# Patient Record
Sex: Female | Born: 1935 | Race: Black or African American | Hispanic: No | Smoking: Never smoker
Health system: Southern US, Community
[De-identification: ages and names within clinical notes are randomized; demographics above are authoritative.]

## PROBLEM LIST (undated history)

## (undated) DIAGNOSIS — F0390 Unspecified dementia without behavioral disturbance: Secondary | ICD-10-CM

## (undated) DIAGNOSIS — C679 Malignant neoplasm of bladder, unspecified: Secondary | ICD-10-CM

## (undated) DIAGNOSIS — K219 Gastro-esophageal reflux disease without esophagitis: Secondary | ICD-10-CM

## (undated) DIAGNOSIS — A048 Other specified bacterial intestinal infections: Secondary | ICD-10-CM

## (undated) DIAGNOSIS — F419 Anxiety disorder, unspecified: Secondary | ICD-10-CM

## (undated) DIAGNOSIS — R002 Palpitations: Secondary | ICD-10-CM

## (undated) DIAGNOSIS — I10 Essential (primary) hypertension: Secondary | ICD-10-CM

## (undated) HISTORY — DX: Gastro-esophageal reflux disease without esophagitis: K21.9

## (undated) HISTORY — DX: Palpitations: R00.2

## (undated) HISTORY — PX: CATARACT EXTRACTION: SUR2

## (undated) HISTORY — DX: Malignant neoplasm of bladder, unspecified: C67.9

## (undated) HISTORY — DX: Other specified bacterial intestinal infections: A04.8

## (undated) HISTORY — DX: Essential (primary) hypertension: I10

## (undated) HISTORY — DX: Anxiety disorder, unspecified: F41.9

---

## 1996-05-07 HISTORY — PX: CHOLECYSTECTOMY: SHX55

## 1997-10-05 ENCOUNTER — Other Ambulatory Visit: Admission: RE | Admit: 1997-10-05 | Discharge: 1997-10-05 | Payer: Self-pay | Admitting: Obstetrics and Gynecology

## 1998-02-03 ENCOUNTER — Ambulatory Visit (HOSPITAL_COMMUNITY): Admission: RE | Admit: 1998-02-03 | Discharge: 1998-02-03 | Payer: Self-pay | Admitting: Obstetrics and Gynecology

## 1999-01-02 ENCOUNTER — Other Ambulatory Visit: Admission: RE | Admit: 1999-01-02 | Discharge: 1999-01-02 | Payer: Self-pay | Admitting: Obstetrics and Gynecology

## 1999-01-18 ENCOUNTER — Ambulatory Visit (HOSPITAL_COMMUNITY): Admission: RE | Admit: 1999-01-18 | Discharge: 1999-01-18 | Payer: Self-pay | Admitting: Obstetrics and Gynecology

## 1999-01-18 ENCOUNTER — Encounter: Payer: Self-pay | Admitting: Obstetrics and Gynecology

## 2000-01-31 ENCOUNTER — Other Ambulatory Visit: Admission: RE | Admit: 2000-01-31 | Discharge: 2000-01-31 | Payer: Self-pay | Admitting: Obstetrics and Gynecology

## 2000-02-08 ENCOUNTER — Ambulatory Visit (HOSPITAL_COMMUNITY): Admission: RE | Admit: 2000-02-08 | Discharge: 2000-02-08 | Payer: Self-pay | Admitting: Obstetrics and Gynecology

## 2000-02-08 ENCOUNTER — Encounter: Payer: Self-pay | Admitting: Obstetrics and Gynecology

## 2001-03-12 ENCOUNTER — Encounter: Payer: Self-pay | Admitting: Obstetrics and Gynecology

## 2001-03-12 ENCOUNTER — Ambulatory Visit (HOSPITAL_COMMUNITY): Admission: RE | Admit: 2001-03-12 | Discharge: 2001-03-12 | Payer: Self-pay | Admitting: Obstetrics and Gynecology

## 2001-03-12 ENCOUNTER — Other Ambulatory Visit: Admission: RE | Admit: 2001-03-12 | Discharge: 2001-03-12 | Payer: Self-pay | Admitting: Obstetrics and Gynecology

## 2001-07-10 ENCOUNTER — Inpatient Hospital Stay (HOSPITAL_COMMUNITY): Admission: AD | Admit: 2001-07-10 | Discharge: 2001-07-15 | Payer: Self-pay | Admitting: Cardiology

## 2002-03-16 ENCOUNTER — Encounter: Payer: Self-pay | Admitting: Obstetrics and Gynecology

## 2002-03-16 ENCOUNTER — Other Ambulatory Visit: Admission: RE | Admit: 2002-03-16 | Discharge: 2002-03-16 | Payer: Self-pay | Admitting: Obstetrics and Gynecology

## 2002-03-16 ENCOUNTER — Ambulatory Visit (HOSPITAL_COMMUNITY): Admission: RE | Admit: 2002-03-16 | Discharge: 2002-03-16 | Payer: Self-pay | Admitting: Obstetrics and Gynecology

## 2002-10-07 ENCOUNTER — Ambulatory Visit (HOSPITAL_COMMUNITY): Admission: RE | Admit: 2002-10-07 | Discharge: 2002-10-07 | Payer: Self-pay | Admitting: Family Medicine

## 2002-12-25 ENCOUNTER — Emergency Department (HOSPITAL_COMMUNITY): Admission: EM | Admit: 2002-12-25 | Discharge: 2002-12-25 | Payer: Self-pay | Admitting: Emergency Medicine

## 2003-01-14 ENCOUNTER — Encounter (HOSPITAL_COMMUNITY): Admission: RE | Admit: 2003-01-14 | Discharge: 2003-02-04 | Payer: Self-pay | Admitting: Family Medicine

## 2003-04-13 ENCOUNTER — Ambulatory Visit (HOSPITAL_COMMUNITY): Admission: RE | Admit: 2003-04-13 | Discharge: 2003-04-13 | Payer: Self-pay | Admitting: Obstetrics and Gynecology

## 2003-04-13 ENCOUNTER — Other Ambulatory Visit: Admission: RE | Admit: 2003-04-13 | Discharge: 2003-04-13 | Payer: Self-pay | Admitting: Obstetrics and Gynecology

## 2003-06-17 ENCOUNTER — Observation Stay (HOSPITAL_COMMUNITY): Admission: EM | Admit: 2003-06-17 | Discharge: 2003-06-18 | Payer: Self-pay | Admitting: Emergency Medicine

## 2004-03-16 ENCOUNTER — Ambulatory Visit: Payer: Self-pay | Admitting: Cardiology

## 2004-05-10 ENCOUNTER — Ambulatory Visit (HOSPITAL_COMMUNITY): Admission: RE | Admit: 2004-05-10 | Discharge: 2004-05-10 | Payer: Self-pay | Admitting: Obstetrics and Gynecology

## 2004-05-10 ENCOUNTER — Other Ambulatory Visit: Admission: RE | Admit: 2004-05-10 | Discharge: 2004-05-10 | Payer: Self-pay | Admitting: Obstetrics and Gynecology

## 2005-03-06 ENCOUNTER — Ambulatory Visit: Payer: Self-pay | Admitting: Cardiology

## 2005-05-14 ENCOUNTER — Other Ambulatory Visit: Admission: RE | Admit: 2005-05-14 | Discharge: 2005-05-14 | Payer: Self-pay | Admitting: Obstetrics and Gynecology

## 2005-05-14 ENCOUNTER — Ambulatory Visit (HOSPITAL_COMMUNITY): Admission: RE | Admit: 2005-05-14 | Discharge: 2005-05-14 | Payer: Self-pay | Admitting: Obstetrics and Gynecology

## 2006-02-19 ENCOUNTER — Ambulatory Visit: Payer: Self-pay | Admitting: Cardiology

## 2006-02-28 ENCOUNTER — Encounter: Payer: Self-pay | Admitting: Cardiology

## 2006-05-20 ENCOUNTER — Ambulatory Visit (HOSPITAL_COMMUNITY): Admission: RE | Admit: 2006-05-20 | Discharge: 2006-05-20 | Payer: Self-pay | Admitting: Obstetrics and Gynecology

## 2007-03-13 ENCOUNTER — Ambulatory Visit: Payer: Self-pay | Admitting: Cardiology

## 2007-05-08 DIAGNOSIS — C679 Malignant neoplasm of bladder, unspecified: Secondary | ICD-10-CM

## 2007-05-08 HISTORY — PX: OTHER SURGICAL HISTORY: SHX169

## 2007-05-08 HISTORY — DX: Malignant neoplasm of bladder, unspecified: C67.9

## 2007-05-28 ENCOUNTER — Ambulatory Visit (HOSPITAL_COMMUNITY): Admission: RE | Admit: 2007-05-28 | Discharge: 2007-05-28 | Payer: Self-pay | Admitting: Obstetrics and Gynecology

## 2007-06-19 ENCOUNTER — Encounter: Payer: Self-pay | Admitting: Family Medicine

## 2007-07-20 ENCOUNTER — Emergency Department (HOSPITAL_COMMUNITY): Admission: EM | Admit: 2007-07-20 | Discharge: 2007-07-20 | Payer: Self-pay | Admitting: Emergency Medicine

## 2007-07-21 ENCOUNTER — Ambulatory Visit: Payer: Self-pay | Admitting: Family Medicine

## 2007-07-23 ENCOUNTER — Encounter: Payer: Self-pay | Admitting: Family Medicine

## 2007-07-23 LAB — CONVERTED CEMR LAB
Calcium: 10.2 mg/dL (ref 8.4–10.5)
Cholesterol: 156 mg/dL (ref 0–200)
Creatinine, Ser: 1.04 mg/dL (ref 0.40–1.20)
HDL: 66 mg/dL (ref >39)
Sodium: 139 meq/L (ref 135–145)
Total CHOL/HDL Ratio: 2.4
Triglycerides: 69 mg/dL (ref <150)

## 2007-07-25 ENCOUNTER — Encounter: Payer: Self-pay | Admitting: Family Medicine

## 2007-07-25 DIAGNOSIS — F411 Generalized anxiety disorder: Secondary | ICD-10-CM | POA: Insufficient documentation

## 2007-07-25 DIAGNOSIS — E785 Hyperlipidemia, unspecified: Secondary | ICD-10-CM

## 2007-07-25 DIAGNOSIS — R011 Cardiac murmur, unspecified: Secondary | ICD-10-CM | POA: Insufficient documentation

## 2007-07-25 DIAGNOSIS — K219 Gastro-esophageal reflux disease without esophagitis: Secondary | ICD-10-CM | POA: Insufficient documentation

## 2007-07-25 DIAGNOSIS — I10 Essential (primary) hypertension: Secondary | ICD-10-CM

## 2007-09-09 ENCOUNTER — Ambulatory Visit: Payer: Self-pay | Admitting: Family Medicine

## 2007-10-16 ENCOUNTER — Ambulatory Visit: Payer: Self-pay | Admitting: Family Medicine

## 2007-11-20 ENCOUNTER — Ambulatory Visit: Payer: Self-pay | Admitting: Family Medicine

## 2007-11-20 LAB — CONVERTED CEMR LAB
Ketones, ur: NEGATIVE mg/dL
Specific Gravity, Urine: 1.019 (ref 1.005–1.03)
Urine Glucose: NEGATIVE mg/dL
WBC, UA: NONE SEEN cells/hpf (ref <3)
pH: 6 (ref 5.0–8.0)

## 2007-11-25 ENCOUNTER — Ambulatory Visit (HOSPITAL_COMMUNITY): Admission: RE | Admit: 2007-11-25 | Discharge: 2007-11-25 | Payer: Self-pay | Admitting: Family Medicine

## 2007-12-01 ENCOUNTER — Ambulatory Visit (HOSPITAL_COMMUNITY): Admission: RE | Admit: 2007-12-01 | Discharge: 2007-12-01 | Payer: Self-pay | Admitting: Urology

## 2007-12-10 ENCOUNTER — Encounter: Payer: Self-pay | Admitting: Family Medicine

## 2007-12-10 ENCOUNTER — Ambulatory Visit: Payer: Self-pay | Admitting: Family Medicine

## 2007-12-22 ENCOUNTER — Ambulatory Visit (HOSPITAL_COMMUNITY): Admission: RE | Admit: 2007-12-22 | Discharge: 2007-12-23 | Payer: Self-pay | Admitting: Urology

## 2007-12-22 ENCOUNTER — Encounter (INDEPENDENT_AMBULATORY_CARE_PROVIDER_SITE_OTHER): Payer: Self-pay | Admitting: Urology

## 2007-12-22 ENCOUNTER — Encounter: Payer: Self-pay | Admitting: Family Medicine

## 2007-12-23 ENCOUNTER — Encounter: Payer: Self-pay | Admitting: Family Medicine

## 2007-12-25 ENCOUNTER — Encounter: Payer: Self-pay | Admitting: Family Medicine

## 2008-01-01 ENCOUNTER — Telehealth: Payer: Self-pay | Admitting: Family Medicine

## 2008-01-29 ENCOUNTER — Telehealth: Payer: Self-pay | Admitting: Family Medicine

## 2008-02-03 ENCOUNTER — Ambulatory Visit: Payer: Self-pay | Admitting: Family Medicine

## 2008-02-04 ENCOUNTER — Telehealth: Payer: Self-pay | Admitting: Family Medicine

## 2008-02-08 DIAGNOSIS — Z8551 Personal history of malignant neoplasm of bladder: Secondary | ICD-10-CM

## 2008-02-19 ENCOUNTER — Encounter: Payer: Self-pay | Admitting: Family Medicine

## 2008-03-05 ENCOUNTER — Encounter: Payer: Self-pay | Admitting: Family Medicine

## 2008-04-07 ENCOUNTER — Ambulatory Visit: Payer: Self-pay | Admitting: Family Medicine

## 2008-04-07 DIAGNOSIS — R5383 Other fatigue: Secondary | ICD-10-CM

## 2008-04-07 DIAGNOSIS — R5381 Other malaise: Secondary | ICD-10-CM

## 2008-04-08 ENCOUNTER — Encounter: Payer: Self-pay | Admitting: Family Medicine

## 2008-04-08 LAB — CONVERTED CEMR LAB
ALT: 14 units/L (ref 0–35)
Albumin: 4.6 g/dL (ref 3.5–5.2)
BUN: 15 mg/dL (ref 6–23)
Chloride: 100 meq/L (ref 96–112)
Cholesterol: 160 mg/dL (ref 0–200)
Hemoglobin: 13.2 g/dL (ref 12.0–15.0)
Indirect Bilirubin: 0.5 mg/dL (ref 0.0–0.9)
LDL Cholesterol: 81 mg/dL (ref 0–99)
MCHC: 33.4 g/dL (ref 30.0–36.0)
Potassium: 4.1 meq/L (ref 3.5–5.3)
RBC: 4.22 M/uL (ref 3.87–5.11)
RDW: 14.4 % (ref 11.5–15.5)
Sodium: 141 meq/L (ref 135–145)
TSH: 1.202 microintl units/mL (ref 0.350–4.50)
Total CHOL/HDL Ratio: 2.4
Total Protein: 7.1 g/dL (ref 6.0–8.3)
Triglycerides: 62 mg/dL (ref <150)
VLDL: 12 mg/dL (ref 0–40)

## 2008-04-09 ENCOUNTER — Encounter: Payer: Self-pay | Admitting: Family Medicine

## 2008-04-09 ENCOUNTER — Ambulatory Visit: Payer: Self-pay | Admitting: Cardiology

## 2008-05-03 ENCOUNTER — Encounter: Payer: Self-pay | Admitting: Family Medicine

## 2008-05-06 ENCOUNTER — Encounter: Payer: Self-pay | Admitting: Family Medicine

## 2008-05-10 ENCOUNTER — Encounter: Payer: Self-pay | Admitting: Cardiology

## 2008-05-10 ENCOUNTER — Ambulatory Visit (HOSPITAL_COMMUNITY): Admission: RE | Admit: 2008-05-10 | Discharge: 2008-05-10 | Payer: Self-pay | Admitting: Cardiology

## 2008-05-10 ENCOUNTER — Ambulatory Visit: Payer: Self-pay | Admitting: Cardiology

## 2008-06-24 ENCOUNTER — Emergency Department (HOSPITAL_COMMUNITY): Admission: EM | Admit: 2008-06-24 | Discharge: 2008-06-24 | Payer: Self-pay | Admitting: Emergency Medicine

## 2008-07-14 ENCOUNTER — Ambulatory Visit: Payer: Self-pay | Admitting: Family Medicine

## 2008-07-15 ENCOUNTER — Ambulatory Visit: Payer: Self-pay | Admitting: Cardiology

## 2008-07-19 ENCOUNTER — Encounter: Payer: Self-pay | Admitting: Family Medicine

## 2008-07-19 ENCOUNTER — Ambulatory Visit (HOSPITAL_COMMUNITY): Admission: RE | Admit: 2008-07-19 | Discharge: 2008-07-19 | Payer: Self-pay | Admitting: Urology

## 2008-07-19 ENCOUNTER — Encounter (INDEPENDENT_AMBULATORY_CARE_PROVIDER_SITE_OTHER): Payer: Self-pay | Admitting: Urology

## 2008-07-20 ENCOUNTER — Encounter: Payer: Self-pay | Admitting: Family Medicine

## 2008-08-03 ENCOUNTER — Encounter: Payer: Self-pay | Admitting: Family Medicine

## 2008-08-10 ENCOUNTER — Telehealth: Payer: Self-pay | Admitting: Family Medicine

## 2008-08-18 ENCOUNTER — Inpatient Hospital Stay (HOSPITAL_COMMUNITY): Admission: EM | Admit: 2008-08-18 | Discharge: 2008-08-19 | Payer: Self-pay | Admitting: Emergency Medicine

## 2008-08-23 ENCOUNTER — Encounter: Payer: Self-pay | Admitting: Family Medicine

## 2008-08-25 ENCOUNTER — Ambulatory Visit: Payer: Self-pay | Admitting: Cardiology

## 2008-08-26 ENCOUNTER — Ambulatory Visit: Payer: Self-pay | Admitting: Family Medicine

## 2008-08-26 LAB — CONVERTED CEMR LAB
Bilirubin Urine: NEGATIVE
Glucose, Urine, Semiquant: NEGATIVE
Specific Gravity, Urine: 1.02
pH: 6

## 2008-08-27 ENCOUNTER — Encounter: Payer: Self-pay | Admitting: Family Medicine

## 2008-08-27 LAB — CONVERTED CEMR LAB
CO2: 28 meq/L (ref 19–32)
Calcium: 9.8 mg/dL (ref 8.4–10.5)
Creatinine, Ser: 1.18 mg/dL (ref 0.40–1.20)
Sodium: 138 meq/L (ref 135–145)

## 2008-09-21 ENCOUNTER — Encounter: Payer: Self-pay | Admitting: Family Medicine

## 2008-10-13 ENCOUNTER — Telehealth: Payer: Self-pay | Admitting: Cardiology

## 2008-11-02 ENCOUNTER — Other Ambulatory Visit: Admission: RE | Admit: 2008-11-02 | Discharge: 2008-11-02 | Payer: Self-pay | Admitting: Family Medicine

## 2008-11-02 ENCOUNTER — Ambulatory Visit: Payer: Self-pay | Admitting: Family Medicine

## 2008-11-02 ENCOUNTER — Encounter: Payer: Self-pay | Admitting: Family Medicine

## 2008-11-02 LAB — CONVERTED CEMR LAB: OCCULT 1: NEGATIVE

## 2008-11-08 DIAGNOSIS — J309 Allergic rhinitis, unspecified: Secondary | ICD-10-CM | POA: Insufficient documentation

## 2008-11-12 ENCOUNTER — Emergency Department (HOSPITAL_COMMUNITY): Admission: EM | Admit: 2008-11-12 | Discharge: 2008-11-12 | Payer: Self-pay | Admitting: Emergency Medicine

## 2008-11-17 ENCOUNTER — Telehealth: Payer: Self-pay | Admitting: Family Medicine

## 2008-11-18 ENCOUNTER — Encounter: Payer: Self-pay | Admitting: Family Medicine

## 2008-11-19 ENCOUNTER — Encounter: Payer: Self-pay | Admitting: Family Medicine

## 2008-11-19 LAB — CONVERTED CEMR LAB
ALT: 65 units/L — ABNORMAL HIGH (ref 0–35)
Albumin: 4.6 g/dL (ref 3.5–5.2)
BUN: 16 mg/dL (ref 6–23)
Chloride: 96 meq/L (ref 96–112)
Cholesterol: 157 mg/dL (ref 0–200)
HDL: 70 mg/dL (ref >39)
Indirect Bilirubin: 0.7 mg/dL (ref 0.0–0.9)
Potassium: 4.6 meq/L (ref 3.5–5.3)
Sodium: 137 meq/L (ref 135–145)
Total Protein: 7.5 g/dL (ref 6.0–8.3)
Triglycerides: 67 mg/dL (ref <150)
VLDL: 13 mg/dL (ref 0–40)

## 2008-11-24 LAB — CONVERTED CEMR LAB: Hep B C IgM: NEGATIVE

## 2008-12-10 ENCOUNTER — Ambulatory Visit: Payer: Self-pay | Admitting: Cardiology

## 2008-12-16 LAB — CONVERTED CEMR LAB
BUN: 14 mg/dL (ref 6–23)
Bilirubin, Direct: 0.2 mg/dL (ref 0.0–0.3)
Chloride: 96 meq/L (ref 96–112)
Creatinine, Ser: 1 mg/dL (ref 0.40–1.20)
Glucose, Bld: 91 mg/dL (ref 70–99)
Indirect Bilirubin: 0.9 mg/dL (ref 0.0–0.9)
LDL Cholesterol: 105 mg/dL — ABNORMAL HIGH (ref 0–99)
Potassium: 3.8 meq/L (ref 3.5–5.3)
VLDL: 17 mg/dL (ref 0–40)

## 2009-01-05 ENCOUNTER — Encounter (INDEPENDENT_AMBULATORY_CARE_PROVIDER_SITE_OTHER): Payer: Self-pay | Admitting: *Deleted

## 2009-01-11 ENCOUNTER — Telehealth: Payer: Self-pay | Admitting: Family Medicine

## 2009-01-11 ENCOUNTER — Ambulatory Visit: Payer: Self-pay | Admitting: Family Medicine

## 2009-01-13 ENCOUNTER — Encounter: Payer: Self-pay | Admitting: Cardiology

## 2009-01-14 ENCOUNTER — Ambulatory Visit: Payer: Self-pay | Admitting: Family Medicine

## 2009-01-14 DIAGNOSIS — M159 Polyosteoarthritis, unspecified: Secondary | ICD-10-CM

## 2009-01-14 DIAGNOSIS — J111 Influenza due to unidentified influenza virus with other respiratory manifestations: Secondary | ICD-10-CM

## 2009-01-26 ENCOUNTER — Encounter: Payer: Self-pay | Admitting: Family Medicine

## 2009-02-07 ENCOUNTER — Telehealth: Payer: Self-pay | Admitting: Family Medicine

## 2009-02-08 ENCOUNTER — Ambulatory Visit: Payer: Self-pay | Admitting: Family Medicine

## 2009-02-14 ENCOUNTER — Encounter: Payer: Self-pay | Admitting: Cardiology

## 2009-02-15 ENCOUNTER — Ambulatory Visit: Payer: Self-pay | Admitting: Internal Medicine

## 2009-02-16 ENCOUNTER — Encounter: Payer: Self-pay | Admitting: Internal Medicine

## 2009-02-16 ENCOUNTER — Ambulatory Visit (HOSPITAL_COMMUNITY): Admission: RE | Admit: 2009-02-16 | Discharge: 2009-02-16 | Payer: Self-pay | Admitting: Internal Medicine

## 2009-02-16 ENCOUNTER — Encounter: Payer: Self-pay | Admitting: Family Medicine

## 2009-02-16 ENCOUNTER — Ambulatory Visit: Payer: Self-pay | Admitting: Internal Medicine

## 2009-02-17 ENCOUNTER — Encounter: Payer: Self-pay | Admitting: Internal Medicine

## 2009-02-17 DIAGNOSIS — Z8711 Personal history of peptic ulcer disease: Secondary | ICD-10-CM

## 2009-02-17 DIAGNOSIS — R1013 Epigastric pain: Secondary | ICD-10-CM

## 2009-02-18 ENCOUNTER — Telehealth (INDEPENDENT_AMBULATORY_CARE_PROVIDER_SITE_OTHER): Payer: Self-pay | Admitting: *Deleted

## 2009-02-19 ENCOUNTER — Encounter: Payer: Self-pay | Admitting: Internal Medicine

## 2009-02-22 ENCOUNTER — Encounter: Payer: Self-pay | Admitting: Internal Medicine

## 2009-02-22 DIAGNOSIS — K2980 Duodenitis without bleeding: Secondary | ICD-10-CM | POA: Insufficient documentation

## 2009-03-08 ENCOUNTER — Telehealth (INDEPENDENT_AMBULATORY_CARE_PROVIDER_SITE_OTHER): Payer: Self-pay

## 2009-03-10 ENCOUNTER — Encounter: Payer: Self-pay | Admitting: Family Medicine

## 2009-03-21 ENCOUNTER — Encounter: Payer: Self-pay | Admitting: Family Medicine

## 2009-03-22 ENCOUNTER — Encounter: Payer: Self-pay | Admitting: Family Medicine

## 2009-03-28 ENCOUNTER — Encounter: Payer: Self-pay | Admitting: Family Medicine

## 2009-03-28 ENCOUNTER — Ambulatory Visit: Payer: Self-pay | Admitting: Gastroenterology

## 2009-04-05 ENCOUNTER — Encounter: Payer: Self-pay | Admitting: Family Medicine

## 2009-05-10 LAB — CONVERTED CEMR LAB
ALT: 65 units/L — ABNORMAL HIGH (ref 0–35)
Bilirubin, Direct: 0.2 mg/dL (ref 0.0–0.3)
CO2: 24 meq/L (ref 19–32)
Chloride: 97 meq/L (ref 96–112)
Glucose, Bld: 87 mg/dL (ref 70–99)
LDL Cholesterol: 138 mg/dL — ABNORMAL HIGH (ref 0–99)
Potassium: 3.6 meq/L (ref 3.5–5.3)
Sodium: 139 meq/L (ref 135–145)
Total Bilirubin: 1 mg/dL (ref 0.3–1.2)
Total CHOL/HDL Ratio: 3.3
VLDL: 15 mg/dL (ref 0–40)

## 2009-05-30 ENCOUNTER — Ambulatory Visit: Payer: Self-pay | Admitting: Family Medicine

## 2009-05-30 DIAGNOSIS — R7401 Elevation of levels of liver transaminase levels: Secondary | ICD-10-CM | POA: Insufficient documentation

## 2009-05-30 DIAGNOSIS — D649 Anemia, unspecified: Secondary | ICD-10-CM

## 2009-05-30 DIAGNOSIS — R740 Nonspecific elevation of levels of transaminase and lactic acid dehydrogenase [LDH]: Secondary | ICD-10-CM

## 2009-05-30 DIAGNOSIS — E559 Vitamin D deficiency, unspecified: Secondary | ICD-10-CM | POA: Insufficient documentation

## 2009-06-01 ENCOUNTER — Telehealth (INDEPENDENT_AMBULATORY_CARE_PROVIDER_SITE_OTHER): Payer: Self-pay | Admitting: *Deleted

## 2009-06-01 ENCOUNTER — Emergency Department (HOSPITAL_COMMUNITY): Admission: EM | Admit: 2009-06-01 | Discharge: 2009-06-01 | Payer: Self-pay | Admitting: Emergency Medicine

## 2009-06-01 LAB — CONVERTED CEMR LAB: Retic Ct Pct: 1.6 % (ref 0.4–3.1)

## 2009-06-06 ENCOUNTER — Ambulatory Visit (HOSPITAL_COMMUNITY)
Admission: RE | Admit: 2009-06-06 | Discharge: 2009-06-06 | Payer: Self-pay | Source: Home / Self Care | Admitting: Family Medicine

## 2009-06-09 ENCOUNTER — Ambulatory Visit: Payer: Self-pay | Admitting: Internal Medicine

## 2009-06-09 DIAGNOSIS — I8000 Phlebitis and thrombophlebitis of superficial vessels of unspecified lower extremity: Secondary | ICD-10-CM

## 2009-06-10 DIAGNOSIS — R1319 Other dysphagia: Secondary | ICD-10-CM

## 2009-06-14 ENCOUNTER — Ambulatory Visit: Payer: Self-pay | Admitting: Internal Medicine

## 2009-07-04 ENCOUNTER — Ambulatory Visit: Payer: Self-pay | Admitting: Family Medicine

## 2009-07-04 DIAGNOSIS — I8390 Asymptomatic varicose veins of unspecified lower extremity: Secondary | ICD-10-CM | POA: Insufficient documentation

## 2009-07-06 ENCOUNTER — Encounter: Payer: Self-pay | Admitting: Family Medicine

## 2009-07-11 ENCOUNTER — Ambulatory Visit: Payer: Self-pay | Admitting: Cardiology

## 2009-07-11 DIAGNOSIS — I491 Atrial premature depolarization: Secondary | ICD-10-CM

## 2009-07-11 DIAGNOSIS — R002 Palpitations: Secondary | ICD-10-CM

## 2009-07-15 ENCOUNTER — Telehealth (INDEPENDENT_AMBULATORY_CARE_PROVIDER_SITE_OTHER): Payer: Self-pay

## 2009-08-01 ENCOUNTER — Ambulatory Visit: Payer: Self-pay | Admitting: Family Medicine

## 2009-08-01 DIAGNOSIS — IMO0002 Reserved for concepts with insufficient information to code with codable children: Secondary | ICD-10-CM

## 2009-08-02 ENCOUNTER — Encounter: Payer: Self-pay | Admitting: Family Medicine

## 2009-08-03 ENCOUNTER — Encounter: Payer: Self-pay | Admitting: Family Medicine

## 2009-08-05 ENCOUNTER — Telehealth: Payer: Self-pay | Admitting: Family Medicine

## 2009-08-08 ENCOUNTER — Ambulatory Visit (HOSPITAL_COMMUNITY): Admission: RE | Admit: 2009-08-08 | Discharge: 2009-08-08 | Payer: Self-pay | Admitting: Family Medicine

## 2009-09-01 ENCOUNTER — Encounter: Payer: Self-pay | Admitting: Family Medicine

## 2009-09-01 ENCOUNTER — Emergency Department (HOSPITAL_COMMUNITY): Admission: EM | Admit: 2009-09-01 | Discharge: 2009-09-01 | Payer: Self-pay | Admitting: Emergency Medicine

## 2009-10-11 ENCOUNTER — Ambulatory Visit: Payer: Self-pay | Admitting: Family Medicine

## 2009-10-27 LAB — CONVERTED CEMR LAB
CO2: 28 meq/L (ref 19–32)
Calcium: 10.1 mg/dL (ref 8.4–10.5)
Chloride: 105 meq/L (ref 96–112)
Sodium: 143 meq/L (ref 135–145)

## 2009-11-25 ENCOUNTER — Ambulatory Visit: Payer: Self-pay | Admitting: Family Medicine

## 2009-11-25 DIAGNOSIS — N3000 Acute cystitis without hematuria: Secondary | ICD-10-CM | POA: Insufficient documentation

## 2009-11-25 DIAGNOSIS — M25519 Pain in unspecified shoulder: Secondary | ICD-10-CM

## 2009-11-25 LAB — CONVERTED CEMR LAB
Bilirubin Urine: NEGATIVE
Specific Gravity, Urine: 1.02
Urobilinogen, UA: 0.2

## 2009-11-26 ENCOUNTER — Encounter: Payer: Self-pay | Admitting: Family Medicine

## 2009-12-05 ENCOUNTER — Telehealth: Payer: Self-pay | Admitting: Family Medicine

## 2009-12-22 LAB — CONVERTED CEMR LAB
ALT: 22 units/L (ref 0–35)
Albumin: 4.5 g/dL (ref 3.5–5.2)
Cholesterol: 220 mg/dL — ABNORMAL HIGH (ref 0–200)
Glucose, Bld: 92 mg/dL (ref 70–99)
Potassium: 4.5 meq/L (ref 3.5–5.3)
Sodium: 142 meq/L (ref 135–145)
Total CHOL/HDL Ratio: 2.8
Total Protein: 7.2 g/dL (ref 6.0–8.3)
Triglycerides: 52 mg/dL (ref <150)
VLDL: 10 mg/dL (ref 0–40)

## 2010-02-01 ENCOUNTER — Telehealth: Payer: Self-pay | Admitting: Family Medicine

## 2010-02-08 ENCOUNTER — Ambulatory Visit: Payer: Self-pay | Admitting: Family Medicine

## 2010-02-09 ENCOUNTER — Telehealth (INDEPENDENT_AMBULATORY_CARE_PROVIDER_SITE_OTHER): Payer: Self-pay | Admitting: *Deleted

## 2010-02-10 ENCOUNTER — Encounter: Payer: Self-pay | Admitting: Family Medicine

## 2010-02-13 ENCOUNTER — Telehealth (INDEPENDENT_AMBULATORY_CARE_PROVIDER_SITE_OTHER): Payer: Self-pay

## 2010-02-14 ENCOUNTER — Encounter: Payer: Self-pay | Admitting: Family Medicine

## 2010-02-17 ENCOUNTER — Encounter (HOSPITAL_COMMUNITY): Admission: RE | Admit: 2010-02-17 | Payer: Self-pay | Admitting: Family Medicine

## 2010-02-22 ENCOUNTER — Encounter: Payer: Self-pay | Admitting: Family Medicine

## 2010-02-28 LAB — CONVERTED CEMR LAB
BUN: 16 mg/dL (ref 6–23)
Chloride: 105 meq/L (ref 96–112)
Glucose, Bld: 97 mg/dL (ref 70–99)
LDL Cholesterol: 109 mg/dL — ABNORMAL HIGH (ref 0–99)
Lymphs Abs: 2.7 10*3/uL (ref 0.7–4.0)
Monocytes Relative: 9 % (ref 3–12)
Neutro Abs: 2.8 10*3/uL (ref 1.7–7.7)
Neutrophils Relative %: 45 % (ref 43–77)
Potassium: 4.2 meq/L (ref 3.5–5.3)
RBC: 4.27 M/uL (ref 3.87–5.11)
Total CHOL/HDL Ratio: 2.8
Triglycerides: 72 mg/dL (ref <150)
VLDL: 14 mg/dL (ref 0–40)
WBC: 6.3 10*3/uL (ref 4.0–10.5)

## 2010-03-16 ENCOUNTER — Encounter: Payer: Self-pay | Admitting: Family Medicine

## 2010-04-02 ENCOUNTER — Ambulatory Visit: Payer: Self-pay | Admitting: Cardiology

## 2010-04-02 ENCOUNTER — Observation Stay (HOSPITAL_COMMUNITY)
Admission: EM | Admit: 2010-04-02 | Discharge: 2010-04-04 | Payer: Self-pay | Source: Home / Self Care | Admitting: Emergency Medicine

## 2010-04-04 ENCOUNTER — Encounter: Payer: Self-pay | Admitting: Family Medicine

## 2010-04-04 ENCOUNTER — Encounter: Payer: Self-pay | Admitting: Cardiology

## 2010-04-10 ENCOUNTER — Ambulatory Visit: Payer: Self-pay | Admitting: Family Medicine

## 2010-04-26 ENCOUNTER — Ambulatory Visit: Payer: Self-pay | Admitting: Family Medicine

## 2010-04-26 DIAGNOSIS — J984 Other disorders of lung: Secondary | ICD-10-CM | POA: Insufficient documentation

## 2010-05-05 ENCOUNTER — Ambulatory Visit: Payer: Self-pay | Admitting: Cardiology

## 2010-05-11 ENCOUNTER — Encounter: Payer: Self-pay | Admitting: Family Medicine

## 2010-05-26 ENCOUNTER — Other Ambulatory Visit: Payer: Self-pay | Admitting: Family Medicine

## 2010-05-26 DIAGNOSIS — C679 Malignant neoplasm of bladder, unspecified: Secondary | ICD-10-CM

## 2010-05-28 ENCOUNTER — Encounter: Payer: Self-pay | Admitting: Family Medicine

## 2010-05-29 ENCOUNTER — Encounter: Payer: Self-pay | Admitting: Family Medicine

## 2010-06-08 NOTE — Letter (Signed)
Summary: urology  urology   Imported By: Lind Guest 05/19/2010 13:29:41  _____________________________________________________________________  External Attachment:    Type:   Image     Comment:   External Document

## 2010-06-08 NOTE — Assessment & Plan Note (Signed)
Summary: ov   Vital Signs:  Patient profile:   77 year old female Menstrual status:  postmenopausal Height:      62 inches Weight:      102.25 pounds BMI:     18.77 O2 Sat:      98 % Pulse rate:   61 / minute Pulse rhythm:   regular Resp:     16 per minute BP sitting:   104 / 60 Cuff size:   regular  Vitals Entered By: Everitt Amber (May 30, 2009 8:14 AM) CC: left leg has big veins that have been popping out, also there is a knot that is hard and sore on the front lower leg today   Primary Care Provider:  Lodema Hong  CC:  left leg has big veins that have been popping out and also there is a knot that is hard and sore on the front lower leg today.  History of Present Illness: Pinful swollen veins last week, with ruptire of te  vein yesterday superficially. She denies any trauma to the area, states her mom had this in the past. She has chronically dilated superficial leg veins. Has appt next week for f/u of PUD, states  she has epigastric pain, not using aciphex but zantac which she states works fairly well and is affordablr. Denies hematuria, states bladder is doing well. Her daughter is relocating in the Summer, she is going to live  with her and she is looking fwd to this.   Current Medications (verified): 1)  Xanax 0.5 Mg  Tabs (Alprazolam) .... Take 1 Tablet By Mouth Two Times A Day 2)  Lotrel 10-20 Mg  Caps (Amlodipine Besy-Benazepril Hcl) .... Take 1 Tablet By Mouth Once A Day 3)  Hydrochlorothiazide 25 Mg  Tabs (Hydrochlorothiazide) .... Take 1 Tablet By Mouth Once A Day 4)  Oscal 500/200 D-3 500-200 Mg-Unit  Tabs (Calcium-Vitamin D) .... Take 1 Tablet By Mouth Three Times A Day 5)  Multivitamins   Tabs (Multiple Vitamin) .... Take 1 Tablet By Mouth Once A Day 6)  Restoril 30 Mg Caps (Temazepam) .... Take 1 Capsule By Mouth At Bedtime 7)  Atenolol 25 Mg Tabs (Atenolol) .... One in The Morning and  A Half in The The Afternoon 8)  Flonase 50 Mcg/act Susp (Fluticasone  Propionate) .... As Needed 9)  Tylenol 325 Mg Tabs (Acetaminophen) .... Prn  Allergies (verified): 1)  ! Sulfa 2)  ! Codeine 3)  ! Pcn  Review of Systems      See HPI Eyes:  Denies blurring and discharge. ENT:  Denies hoarseness and nasal congestion. CV:  Denies chest pain or discomfort, palpitations, and swelling of feet. Resp:  Denies cough and sputum productive. GI:  Complains of abdominal pain, constipation, and indigestion; no bM in 2 days. GU:  Denies dysuria and urinary frequency. MS:  Denies joint pain and stiffness. Derm:  Complains of lesion(s); bruising on left leg where vein recently ruptured. Neuro:  Denies headaches and seizures. Psych:  Complains of anxiety; denies depression. Endo:  Denies cold intolerance, excessive hunger, excessive thirst, excessive urination, heat intolerance, polyuria, and weight change. Heme:  Denies abnormal bruising and bleeding. Allergy:  Denies hives or rash and sneezing.  Physical Exam  General:  Well-developedadequatelyl-nourished,in no acute distress; alert,appropriate and cooperative throughout examination HEENT: No facial asymmetry,  EOMI, No sinus tenderness, TM's Clear, oropharynx  pink and moist.   Chest: Clear to auscultation bilaterally.  CVS: S1, S2, No murmurs, No S3.   Abd:  Soft, Nontender.  MS: Adequate ROM spine, hips, shoulders and knees.  Ext: No edema. tender swelling of veins of left lower ext   CNS: CN 2-12 intact, power tone and sensation normal throughout.   Skin: Intact, no visible lesions or rashes.  Psych: Good eye contact, normal affect.  Memory intact, not anxious or depressed appearing.    Impression & Recommendations:  Problem # 1:  TRANSAMINASES, SERUM, ELEVATED (ICD-790.4) Assessment Comment Only  Orders: Radiology Referral (Radiology)  Problem # 2:  PHLEBITIS&THROMBOPHLEB SUP VESSELS LOWER EXTREM (ICD-451.0) Assessment: Comment Only ciprofloxacin prescribed  Problem # 3:  HYPERLIPIDEMIA  (ICD-272.4) Assessment: Deteriorated  Labs Reviewed: SGOT: 57 (05/10/2009)   SGPT: 65 (05/10/2009)   HDL:68 (05/10/2009), 69 (12/15/2008)  LDL:138 (05/10/2009), 105 (12/15/2008)  Chol:221 (05/10/2009), 191 (12/15/2008)  Trig:77 (05/10/2009), 84 (12/15/2008) low fat diet , pt has abn LFT's  Problem # 4:  ANXIETY, CHRONIC (ICD-300.00) Assessment: Unchanged  Her updated medication list for this problem includes:    Xanax 0.5 Mg Tabs (Alprazolam) .Marland Kitchen... Take 1 tablet by mouth two times a day  Problem # 5:  HYPERTENSION (ICD-401.9) Assessment: Unchanged  Her updated medication list for this problem includes:    Lotrel 10-20 Mg Caps (Amlodipine besy-benazepril hcl) .Marland Kitchen... Take 1 tablet by mouth once a day    Hydrochlorothiazide 25 Mg Tabs (Hydrochlorothiazide) .Marland Kitchen... Take 1 tablet by mouth once a day    Atenolol 25 Mg Tabs (Atenolol) ..... One in the morning and  a half in the the afternoon  BP today: 104/60 Prior BP: 110/68 (03/28/2009)  Labs Reviewed: K+: 3.6 (05/10/2009) Creat: : 1.07 (05/10/2009)   Chol: 221 (05/10/2009)   HDL: 68 (05/10/2009)   LDL: 138 (05/10/2009)   TG: 77 (05/10/2009)  Complete Medication List: 1)  Xanax 0.5 Mg Tabs (Alprazolam) .... Take 1 tablet by mouth two times a day 2)  Lotrel 10-20 Mg Caps (Amlodipine besy-benazepril hcl) .... Take 1 tablet by mouth once a day 3)  Hydrochlorothiazide 25 Mg Tabs (Hydrochlorothiazide) .... Take 1 tablet by mouth once a day 4)  Oscal 500/200 D-3 500-200 Mg-unit Tabs (Calcium-vitamin d) .... Take 1 tablet by mouth three times a day 5)  Multivitamins Tabs (Multiple vitamin) .... Take 1 tablet by mouth once a day 6)  Restoril 30 Mg Caps (Temazepam) .... Take 1 capsule by mouth at bedtime 7)  Atenolol 25 Mg Tabs (Atenolol) .... One in the morning and  a half in the the afternoon 8)  Flonase 50 Mcg/act Susp (Fluticasone propionate) .... As needed 9)  Tylenol 325 Mg Tabs (Acetaminophen) .... Prn 10)  Ciprofloxacin Hcl 500 Mg Tabs  (Ciprofloxacin hcl) .... Take 1 tablet by mouth two times a day  Other Orders: T-CBC w/Diff ([id number]) T- * Misc. Laboratory test ([phone]) T-TSH ([phone]) T-Vitamin D (25-Hydroxy) ([phone])  Patient Instructions: 1)  F/U in 6 to 8 weeks 2)  Yopu will bwe referred for an [place] of your right upper quad to eval abn  LFT's I will  send the info to Dr. Kendell Bane 3)  CBC and diff, anemia panel for fatigue, TSH and Vit D levekl today. 4)  Med is sent in for the phlebitis on your left leg Prescriptions: CIPROFLOXACIN HCL 500 MG TABS (CIPROFLOXACIN HCL) Take 1 tablet by mouth two times a day  #14 x 0   Entered and Authorized by:   Syliva Overman MD   Signed by:   Syliva Overman MD on 05/30/2009   Method used:   Electronically to  [facility]* (retail)       [address]       [place], [place]  [address]       Ph: [phone]       Fax: [phone]   RxID:   [id number]

## 2010-06-08 NOTE — Assessment & Plan Note (Signed)
Summary: CRD.GU  pt returned ifobt and it was positive  Allergies: 1)  ! Sulfa 2)  ! Codeine 3)  ! Pcn  Other Orders: Immuno-chemical Fecal Occult ([id number])

## 2010-06-08 NOTE — Letter (Signed)
Summary: NEW DOSE OF MEDICATION  NEW DOSE OF MEDICATION   Imported By: Lind Guest 04/11/2010 09:37:49  _____________________________________________________________________  External Attachment:    Type:   Image     Comment:   External Document

## 2010-06-08 NOTE — Letter (Signed)
Summary: handicapp card  handicapp card   Imported By: Lind Guest 08/02/2009 14:33:11  _____________________________________________________________________  External Attachment:    Type:   Image     Comment:   External Document

## 2010-06-08 NOTE — Assessment & Plan Note (Signed)
Summary: bp   Vital Signs:  Patient profile:   77 year old female Menstrual status:  postmenopausal Height:      62 inches Weight:      108 pounds O2 Sat:      96 % on Room air Pulse rate:   53 / minute Pulse rhythm:   regular Resp:     16 per minute BP sitting:   190 / 108  (right arm)  Vitals Entered By: Adella Hare LPN (April 10, 2010 3:59 PM)  O2 Flow:  Room air CC: follow-up visit Is Patient Diabetic? No Pain Assessment Patient in pain? no        Primary Care Shaquille Murdy:  Dr. Syliva Overman  CC:  follow-up visit.  History of Present Illness: hospitalised  at [facility] on 11/27 with chest pain, at the time of discharge she had her antihypertensives discontinued, and since then her bP has been markedly elevated. he c/o headache  and 'not feeling well."  Denies recent fever or chills. Denies sinus pressure, nasal congestion , ear pain or sore throat. Denies chest congestion, or cough productive of sputum. Denies chest pain, palpitations, PND, orthopnea or leg swelling. Denies abdominal pain, nausea, vomitting, diarrhea or constipation. Denies change in bowel movements or bloody stool. Denies dysuria , frequency, incontinence or hesitancy. Denies  joint pain, swelling, or reduced mobility.  Denies depression,does report incereased anxiety and  insomnia. Denies  rash, lesions, or itch.     Current Medications (verified): 1)  Xanax 0.5 Mg  Tabs (Alprazolam) .... Take 1 Tablet By Mouth Once Daily 2)  Lotrel 10-20 Mg  Caps (Amlodipine Besy-Benazepril Hcl) .... Take 1 Tablet By Mouth Once A Day 3)  Oscal 500/200 D-3 500-200 Mg-Unit  Tabs (Calcium-Vitamin D) .... Take 1 Tablet By Mouth Three Times A Day 4)  Multivitamins   Tabs (Multiple Vitamin) .... Take 1 Tablet By Mouth Once A Day 5)  Restoril 30 Mg Caps (Temazepam) .... Take 1 Capsule By Mouth At Bedtime 6)  Atenolol 25 Mg Tabs (Atenolol) .... One in The Morning and  A Half in The The Afternoon 7)  Aleve .... As  Needed 8)  Pantoprazole Sodium 40 Mg Tbec (Pantoprazole Sodium) .... One Cap By Mouth Two Times A Day 9)  Hydrochlorothiazide 25 Mg Tabs (Hydrochlorothiazide) .... One Half Tablet Daily Effective 11/25/2009 10)  Tylenol Extra Strength 500 Mg Tabs (Acetaminophen) .... One Tablet Twice Daily As Needed For Headache or Joint Pain, Maximum Is 8 Tablets  Per Week  Allergies (verified): 1)  ! Sulfa 2)  ! Codeine 3)  ! Pcn  Review of Systems      See HPI General:  Complains of fatigue. Eyes:  Denies blurring, discharge, eye pain, and red eye. MS:  Complains of joint pain, muscle weakness, and stiffness; improved neck pain. Psych:  Complains of anxiety; denies depression, mental problems, panic attacks, sense of great danger, suicidal thoughts/plans, thoughts of violence, and unusual visions or sounds. Endo:  Denies cold intolerance, excessive hunger, excessive thirst, and heat intolerance. Heme:  Denies abnormal bruising, bleeding, enlarge lymph nodes, and fevers. Allergy:  Denies hives or rash, itching eyes, and seasonal allergies.  Physical Exam  General:  Well-developed,well-nourished,in no acute distress; alert,appropriate and cooperative throughout examination HEENT: No facial asymmetry,  EOMI, No sinus tenderness, TM's Clear, oropharynx  pink and moist.   Chest: Clear to auscultation bilaterally.  CVS: S1, S2, No murmurs, No S3.   Abd: Soft, Nontender.  MS: decreased  ROM  spine,adequate hips, shoulders and knees.  Ext: No edema.   CNS: CN 2-12 intact, power tone and sensation normal throughout.   Skin: Intact, no visible lesions or rashes.  Psych: Good eye contact, normal affect.  Memory intact, mildly anxious not depressed appearing.    Impression & Recommendations:  Problem # 1:  SHOULDER PAIN, LEFT (ICD-719.41) Assessment Improved  Her updated medication list for this problem includes:    Tylenol Extra Strength 500 Mg Tabs (Acetaminophen) ..... One tablet twice daily as  needed for headache or joint pain, maximum is 8 tablets  per week  Problem # 2:  ANXIETY, CHRONIC (ICD-300.00) Assessment: Deteriorated  Her updated medication list for this problem includes:    Xanax 0.5 Mg Tabs (Alprazolam) .Marland Kitchen... Take 1 tablet by mouth once daily  Problem # 3:  HYPERLIPIDEMIA (ICD-272.4) Assessment: Improved  Labs Reviewed: SGOT: 28 (12/16/2009)   SGPT: 22 (12/16/2009)   HDL:70 (02/28/2010), 78 (12/16/2009)  LDL:109 (02/28/2010), 132 (12/16/2009)  Chol:193 (02/28/2010), 220 (12/16/2009)  Trig:72 (02/28/2010), 52 (12/16/2009) Low fat dietdiscussed and encouraged pt has elevated liver enzymes and is off med to lower cholesterol  Problem # 4:  HYPERTENSION (ICD-401.9) Assessment: Deteriorated  The following medications were removed from the medication list:    Hydrochlorothiazide 25 Mg Tabs (Hydrochlorothiazide) ..... One half tablet daily effective 11/25/2009 Her updated medication list for this problem includes:    Lotrel 10-20 Mg Caps (Amlodipine besy-benazepril hcl) .Marland Kitchen... Take 1 tablet by mouth once a day    Atenolol 25 Mg Tabs (Atenolol) ..... One in the morning and  a half in the the afternoon    Amlodipine Besy-benazepril Hcl 10-20 Mg Caps (Amlodipine besy-benazepril hcl) .Marland Kitchen... Take 1 capsule by mouth once a day at 5pm    Atenolol 25 Mg Tabs (Atenolol) .Marland Kitchen... Take 1 tablet by mouth once a day at 8am ever morning    Hydrochlorothiazide 12.5 Mg Tabs (Hydrochlorothiazide) .Marland Kitchen... Take 1 tablet by mouth once a day at 8 am every morning  Orders: Medicare Electronic Prescription [phone])  BP today: 190/108 Prior BP: 120/60 (02/08/2010)  Labs Reviewed: K+: 4.2 (02/28/2010) Creat: : 0.98 (02/28/2010)   Chol: 193 (02/28/2010)   HDL: 70 (02/28/2010)   LDL: 109 (02/28/2010)   TG: 72 (02/28/2010)  Complete Medication List: 1)  Xanax 0.5 Mg Tabs (Alprazolam) .... Take 1 tablet by mouth once daily 2)  Lotrel 10-20 Mg Caps (Amlodipine besy-benazepril hcl) .... Take 1  tablet by mouth once a day 3)  Oscal 500/200 D-3 500-200 Mg-unit Tabs (Calcium-vitamin d) .... Take 1 tablet by mouth three times a day 4)  Multivitamins Tabs (Multiple vitamin) .... Take 1 tablet by mouth once a day 5)  Restoril 30 Mg Caps (Temazepam) .... Take 1 capsule by mouth at bedtime 6)  Atenolol 25 Mg Tabs (Atenolol) .... One in the morning and  a half in the the afternoon 7)  Aleve  .... As needed 8)  Pantoprazole Sodium 40 Mg Tbec (Pantoprazole sodium) .... One cap by mouth two times a day 9)  Tylenol Extra Strength 500 Mg Tabs (Acetaminophen) .... One tablet twice daily as needed for headache or joint pain, maximum is 8 tablets  per week 10)  Amlodipine Besy-benazepril Hcl 10-20 Mg Caps (Amlodipine besy-benazepril hcl) .... Take 1 capsule by mouth once a day at 5pm 11)  Atenolol 25 Mg Tabs (Atenolol) .... Take 1 tablet by mouth once a day at 8am ever morning 12)  Hydrochlorothiazide 12.5 Mg Tabs (Hydrochlorothiazide) .... Take 1 tablet  by mouth once a day at 8 am every morning  Patient Instructions: 1)  Keep appt as before 2)  PLS resume 3)  amlodipine /benazepril one every evening at 5 pm 4)  Morning BP meds at 8am are hydrochorthiazide 12.5 mg one daily and atenolol 25mg  one daily Prescriptions: HYDROCHLOROTHIAZIDE 12.5 MG TABS (HYDROCHLOROTHIAZIDE) Take 1 tablet by mouth once a day at 8 am every morning  #30 x 2   Entered and Authorized by:   Syliva Overman MD   Signed by:   Syliva Overman MD on 04/10/2010   Method used:   Printed then faxed to ...       [facility]* (retail)       [address]       [place], [place]  [address]       Ph: [phone]       Fax: [phone]   RxID:   [id number] ATENOLOL 25 MG TABS (ATENOLOL) Take 1 tablet by mouth once a day at 8am ever morning  #30 x 3   Entered and Authorized by:   Syliva Overman MD   Signed by:   Syliva Overman MD on 04/10/2010   Method used:   Electronically to         [facility]* (retail)       [address]       [place], [place]  [address]       Ph: [phone]       Fax: [phone]   RxID:   [id number] AMLODIPINE BESY-BENAZEPRIL HCL 10-20 MG CAPS (AMLODIPINE BESY-BENAZEPRIL HCL) Take 1 capsule by mouth once a day at 5pm  #30 x 2   Entered and Authorized by:   Syliva Overman MD   Signed by:   Syliva Overman MD on 04/10/2010   Method used:   Historical   RxID:   [id number]    Orders Added: 1)  Est. Patient Level IV [73710] 2)  Medicare Electronic Prescription [G2694]

## 2010-06-08 NOTE — Assessment & Plan Note (Signed)
Summary: office visit   Vital Signs:  Patient profile:   77 year old female Menstrual status:  postmenopausal Height:      62 inches Weight:      100.75 pounds BMI:     18.49 O2 Sat:      97 % Pulse rate:   62 / minute Pulse rhythm:   regular Resp:     16 per minute BP sitting:   110 / 60  (left arm) Cuff size:   regular  Vitals Entered By: Everitt Amber LPN (August 01, 2009 8:38 AM) CC: her left leg has been bothering her, has problems standing on it and it has been hurting her in the front lower leg. It was some knots there but they have went away for the most part   Primary Care Provider:  Dr. Syliva Overman  CC:  her left leg has been bothering her and has problems standing on it and it has been hurting her in the front lower leg. It was some knots there but they have went away for the most part.  History of Present Illness: Pt has 2 major complaints, concerning frequent headaches, mainly at the end of the day. She reports beoing depressed and lonely ansd is willing to consider both an antidepressant , and joing the senior citizens grp. Shwe also c/o increased back pain radiaITING TO LOWER EXT WITH WEAkness.sHE STATES SHE HAS BEEN TOLD IN THE PAST THAT SHE HAS DISC DISEASE. Reports  that she is  otherwise doing fairly well.She does anticipate her daughter coming to live with her in the Summer and states this will make things better for her overall. Denies recent fever or chills. Denies sinus pressure, nasal congestion , ear pain or sore throat. Denies chest congestion, or cough productive of sputum. Denies chest pain, palpitations, PND, orthopnea or leg swelling. Denies abdominal pain, nausea, vomitting, diarrhea or constipation. Denies change in bowel movements or bloody stool. Denies dysuria , frequency, incontinence or hesitancy.  Denies vertigo, seizures. Denies  anxiety or insomnia. Denies  rash, lesions, or itch.     Current Medications (verified): 1)  Xanax 0.5  Mg  Tabs (Alprazolam) .... Take 1 Tablet By Mouth Once Daily 2)  Lotrel 10-20 Mg  Caps (Amlodipine Besy-Benazepril Hcl) .... Take 1 Tablet By Mouth Once A Day 3)  Hydrochlorothiazide 25 Mg  Tabs (Hydrochlorothiazide) .... Take 1 Tablet By Mouth Once A Day 4)  Oscal 500/200 D-3 500-200 Mg-Unit  Tabs (Calcium-Vitamin D) .... Take 1 Tablet By Mouth Three Times A Day 5)  Multivitamins   Tabs (Multiple Vitamin) .... Take 1 Tablet By Mouth Once A Day 6)  Restoril 30 Mg Caps (Temazepam) .... Take 1 Capsule By Mouth At Bedtime 7)  Atenolol 25 Mg Tabs (Atenolol) .... One in The Morning and  A Half in The The Afternoon 8)  Flonase 50 Mcg/act Susp (Fluticasone Propionate) .... As Needed 9)  Zantac 150 Mg Tabs (Ranitidine Hcl) .... Take 1 Tablet By Mouth Once A Day 10)  Aleve .... As Needed  Allergies (verified): 1)  ! Sulfa 2)  ! Codeine 3)  ! Pcn  Review of Systems      See HPI Eyes:  Denies blurring and discharge. MS:  Complains of low back pain and muscle weakness; dx with bulging disc in the back in 2007, feels as thopugh pain is worsening, avgs about 6, she does not experience num,bness, but left leg weakness, feels as though she will falll after standing for  aboutmore than 5 mionutes, uses a cane at home, she feels unsafe in the shower, denies falls. Neuro:  Complains of headaches; denies seizures and sensation of room spinning; Daily headaxches fior the past 1 month, she does report stress and depression, getting tired of this, sleeps 7 hrs per day, rate is 5, she uses on avg 2 tylenol daily, the pain is reklieved in 1 to 2 hrs with or without meds, headaches occur at the end of the day. Psych:  Complains of anxiety and depression; denies suicidal thoughts/plans, thoughts of violence, and unusual visions or sounds. Endo:  Denies excessive thirst and excessive urination. Heme:  Denies abnormal bruising and bleeding. Allergy:  Complains of seasonal allergies.  Physical Exam  General:   Well-developed,well-nourished,in no acute distress; alert,appropriate and cooperative throughout examination HEENT: No facial asymmetry,  EOMI, No sinus tenderness, TM's Clear, oropharynx  pink and moist.   Chest: Clear to auscultation bilaterally.  CVS: S1, S2, No murmurs, No S3.   Abd: Soft, Nontender.  MS: decreased ROM spine, hips, shoulders and knees.  Ext: No edema.   CNS: CN 2-12 intact, power and sensation decreased in left lower extremity   Skin: Intact, no visible lesions or rashes.  Psych: Good eye contact, normal affect.  Memory intact, not anxious or depressed appearing.    Impression & Recommendations:  Problem # 1:  BACK PAIN WITH RADICULOPATHY (ICD-729.2) Assessment Deteriorated  Orders: Radiology Referral (Radiology)  Problem # 2:  ANXIETY, CHRONIC (ICD-300.00) Assessment: Deteriorated  Her updated medication list for this problem includes:    Xanax 0.5 Mg Tabs (Alprazolam) .Marland Kitchen... Take 1 tablet by mouth once daily    Sertraline Hcl 25 Mg Tabs (Sertraline hcl) .Marland Kitchen... Take 1 tablet by mouth once a day  Problem # 3:  HYPERTENSION (ICD-401.9) Assessment: Unchanged  Her updated medication list for this problem includes:    Lotrel 10-20 Mg Caps (Amlodipine besy-benazepril hcl) .Marland Kitchen... Take 1 tablet by mouth once a day    Hydrochlorothiazide 25 Mg Tabs (Hydrochlorothiazide) .Marland Kitchen... Take 1 tablet by mouth once a day    Atenolol 25 Mg Tabs (Atenolol) ..... One in the morning and  a half in the the afternoon  BP today: 110/60 Prior BP: 117/55 (07/11/2009)  Labs Reviewed: K+: 3.6 (05/10/2009) Creat: : 1.07 (05/10/2009)   Chol: 221 (05/10/2009)   HDL: 68 (05/10/2009)   LDL: 138 (05/10/2009)   TG: 77 (05/10/2009)  Problem # 4:  PALPITATIONS (ICD-785.1) Assessment: Improved  Her updated medication list for this problem includes:    Atenolol 25 Mg Tabs (Atenolol) ..... One in the morning and  a half in the the afternoon  Complete Medication List: 1)  Xanax 0.5 Mg Tabs  (Alprazolam) .... Take 1 tablet by mouth once daily 2)  Lotrel 10-20 Mg Caps (Amlodipine besy-benazepril hcl) .... Take 1 tablet by mouth once a day 3)  Hydrochlorothiazide 25 Mg Tabs (Hydrochlorothiazide) .... Take 1 tablet by mouth once a day 4)  Oscal 500/200 D-3 500-200 Mg-unit Tabs (Calcium-vitamin d) .... Take 1 tablet by mouth three times a day 5)  Multivitamins Tabs (Multiple vitamin) .... Take 1 tablet by mouth once a day 6)  Restoril 30 Mg Caps (Temazepam) .... Take 1 capsule by mouth at bedtime 7)  Atenolol 25 Mg Tabs (Atenolol) .... One in the morning and  a half in the the afternoon 8)  Flonase 50 Mcg/act Susp (Fluticasone propionate) .... As needed 9)  Zantac 150 Mg Tabs (Ranitidine hcl) .... Take 1 tablet  by mouth once a day 10)  Aleve  .... As needed 11)  Sertraline Hcl 25 Mg Tabs (Sertraline hcl) .... Take 1 tablet by mouth once a day  Patient Instructions: 1)  Please schedule a follow-up appointment in 2 months. 2)  I encourage you stronly to start med for depression, this will help your headaches, and also to become involved in the senior citizens gp. 3)  You will be referred for an MRI of your back, and wioll gert a script for a cane, shower cjhair and a handicap sticker. 4)  Pls be careful not to fall Prescriptions: SERTRALINE HCL 25 MG TABS (SERTRALINE HCL) Take 1 tablet by mouth once a day  #30 x 3   Entered and Authorized by:   Syliva Overman MD   Signed by:   Syliva Overman MD on 08/01/2009   Method used:   Electronically to        [facility]* (retail)       [address]       [place], [place]  [address]       Ph: [phone]       Fax: [phone]   RxID:   [id number]

## 2010-06-08 NOTE — Assessment & Plan Note (Signed)
Summary: office visit   Vital Signs:  Patient profile:   77 year old female Menstrual status:  postmenopausal Height:      62 inches Weight:      106.50 pounds BMI:     19.55 O2 Sat:      98 % Pulse rate:   59 / minute Pulse rhythm:   regular Resp:     16 per minute BP sitting:   98 / 56 Cuff size:   regular  Vitals Entered By: Everitt Amber LPN (November 25, 2009 8:12 AM) CC: having bad headaches in the forehead area   Primary Care Provider:  Dr. Syliva Overman  CC:  having bad headaches in the forehead area.  History of Present Illness: Reports  that she is doing fairly well. Denies recent fever or chills. Denies sinus pressure, nasal congestion , ear pain or sore throat. Denies chest congestion, or cough productive of sputum. Denies chest pain, palpitations, PND, orthopnea or leg swelling. Denies abdominal pain, nausea, vomitting, diarrhea or constipation. Denies change in bowel movements or bloody stool. Denies dysuria , frequency, incontinence or hesitancy. Denies  joint pain, swelling, or reduced mobility. Denies headaches, vertigo, seizures.  Denies  rash, lesions, or itch.     Current Medications (verified): 1)  Xanax 0.5 Mg  Tabs (Alprazolam) .... Take 1 Tablet By Mouth Once Daily 2)  Lotrel 10-20 Mg  Caps (Amlodipine Besy-Benazepril Hcl) .... Take 1 Tablet By Mouth Once A Day 3)  Hydrochlorothiazide 25 Mg  Tabs (Hydrochlorothiazide) .... Take 1 Tablet By Mouth Once A Day 4)  Oscal 500/200 D-3 500-200 Mg-Unit  Tabs (Calcium-Vitamin D) .... Take 1 Tablet By Mouth Three Times A Day 5)  Multivitamins   Tabs (Multiple Vitamin) .... Take 1 Tablet By Mouth Once A Day 6)  Restoril 30 Mg Caps (Temazepam) .... Take 1 Capsule By Mouth At Bedtime 7)  Atenolol 25 Mg Tabs (Atenolol) .... One in The Morning and  A Half in The The Afternoon 8)  Aleve .... As Needed 9)  Pantoprazole Sodium 40 Mg Tbec (Pantoprazole Sodium) .... One Cap By Mouth Two Times A Day  Allergies  (verified): 1)  ! Sulfa 2)  ! Codeine 3)  ! Pcn  Review of Systems      See HPI General:  Complains of fatigue. Eyes:  Denies blurring, discharge, eye pain, and red eye. GU:  bladder cancer in remission. MS:  Complains of joint pain and stiffness; 2 month h/o increased left shoulder pain and stiffness, has benefitted from therap[y in the past and wishes to go back. Neuro:  Complains of headaches; 2 day h/o frontal pressure with increased post nasal drainage, no fevr or chills, no green drainage. Psych:  Complains of anxiety and depression; denies mental problems, panic attacks, suicidal thoughts/plans, thoughts of violence, and unusual visions or sounds; pt reports some social phobia, did not take the antidepressant and still remains in thehome most of the time, she promises to do better, and is in with her daughter who has relocated. Endo:  Denies excessive thirst and excessive urination. Heme:  Denies abnormal bruising and bleeding. Allergy:  Complains of seasonal allergies; mild.  Physical Exam  General:  Well-developed,adequately nourished,in no acute distress; alert,appropriate and cooperative throughout examination HEENT: No facial asymmetry,  EOMI, No sinus tenderness, TM's Clear, oropharynx  pink and moist.   Chest: Clear to auscultation bilaterally.  CVS: S1, S2, No murmurs, No S3.   Abd: Soft, Nontender.  MS: decreased ROM spine, hips,  shoulders and knees.  Ext: No edema.   CNS: CN 2-12 intact, power and sensation normal throughout  Skin: Intact, no visible lesions or rashes.  Psych: Good eye contact, normal affect.  Memory intact, not anxious or depressed appearing.    Impression & Recommendations:  Problem # 1:  HYPERTENSION (ICD-401.9) Assessment Comment Only  The following medications were removed from the medication list:    Hydrochlorothiazide 25 Mg Tabs (Hydrochlorothiazide) .Marland Kitchen... Take 1 tablet by mouth once a day Her updated medication list for this problem  includes:    Lotrel 10-20 Mg Caps (Amlodipine besy-benazepril hcl) .Marland Kitchen... Take 1 tablet by mouth once a day    Atenolol 25 Mg Tabs (Atenolol) ..... One in the morning and  a half in the the afternoon    Hydrochlorothiazide 25 Mg Tabs (Hydrochlorothiazide) ..... One half tablet daily effective 11/25/2009  Orders: T-Basic Metabolic Panel [id number])  BP today: 98/56, overcorrected, will reduce dose Prior BP: 104/58 (10/11/2009)  Labs Reviewed: K+: 4.8 (10/25/2009) Creat: : 1.05 (10/25/2009)   Chol: 221 (05/10/2009)   HDL: 68 (05/10/2009)   LDL: 138 (05/10/2009)   TG: 77 (05/10/2009)  Problem # 2:  PREMATURE ATRIAL CONTRACTIONS (ICD-427.61) Assessment: Improved  Her updated medication list for this problem includes:    Atenolol 25 Mg Tabs (Atenolol) ..... One in the morning and  a half in the the afternoon, followed by cardiology  Problem # 3:  BLADDER CANCER (ICD-188.9) Assessment: Comment Only doing well and followed  by urology  Problem # 4:  HYPERLIPIDEMIA (ICD-272.4) Assessment: Comment Only  Orders: T-Hepatic Function [id number]) T-Lipid Profile [id number])  Labs Reviewed: SGOT: 57 (05/10/2009)   SGPT: 65 (05/10/2009)   HDL:68 (05/10/2009), 69 (12/15/2008)  LDL:138 (05/10/2009), 105 (12/15/2008)  Chol:221 (05/10/2009), 191 (12/15/2008)  Trig:77 (05/10/2009), 84 (12/15/2008)low fat dietdiscussed andencouraged  Problem # 5:  ANXIETY, CHRONIC (ICD-300.00) Assessment: Improved  The following medications were removed from the medication list:    Sertraline Hcl 25 Mg Tabs (Sertraline hcl) .Marland Kitchen... Take 1 tablet by mouth once a day Her updated medication list for this problem includes:    Xanax 0.5 Mg Tabs (Alprazolam) .Marland Kitchen... Take 1 tablet by mouth once daily  Complete Medication List: 1)  Xanax 0.5 Mg Tabs (Alprazolam) .... Take 1 tablet by mouth once daily 2)  Lotrel 10-20 Mg Caps (Amlodipine besy-benazepril hcl) .... Take 1 tablet by mouth once a day 3)  Oscal 500/200  D-3 500-200 Mg-unit Tabs (Calcium-vitamin d) .... Take 1 tablet by mouth three times a day 4)  Multivitamins Tabs (Multiple vitamin) .... Take 1 tablet by mouth once a day 5)  Restoril 30 Mg Caps (Temazepam) .... Take 1 capsule by mouth at bedtime 6)  Atenolol 25 Mg Tabs (Atenolol) .... One in the morning and  a half in the the afternoon 7)  Aleve  .... As needed 8)  Pantoprazole Sodium 40 Mg Tbec (Pantoprazole sodium) .... One cap by mouth two times a day 9)  Hydrochlorothiazide 25 Mg Tabs (Hydrochlorothiazide) .... One half tablet daily effective 11/25/2009 10)  Tylenol Extra Strength 500 Mg Tabs (Acetaminophen) .... One tablet twice daily as needed for headache or joint pain, maximum is 8 tablets  per week  Other Orders: UA Dipstick W/ Micro (manual) ([id number]) T-Culture, Urine ([id number]) Physical Therapy Referral (PT) Radiology Referral (Radiology)  Patient Instructions: 1)  Please schedule a follow-up appointment in 2 to 2.5 month. 2)  Early m,orningBMP prior to visit, ICD-9: 3)  Hepatic Panel prior to visit, ICD-9:  fasting on day of next ovL Panel prior to visit, ICD-9:. lipid 4)  Pls make an effort to start regular exercise , an interacting with people. 5)  We will sched your mamo and let you know. 6)  BP is low, pLS Break the HCTZ(hydrocholrthiazide) in HALF, DO NOT TAKE a whole   Prescriptions: TYLENOL EXTRA STRENGTH 500 MG TABS (ACETAMINOPHEN) one tablet twice daily as needed for headache or joint pain, maximum is 8 tablets  per week  #40 x 0   Entered and Authorized by:   Syliva Overman MD   Signed by:   Syliva Overman MD on 11/25/2009   Method used:   Electronically to        [facility]* (retail)       [address]       [place], [place]  [address]       Ph: [phone]       Fax: [phone]   RxID:   [id number] HYDROCHLOROTHIAZIDE 25 MG TABS (HYDROCHLOROTHIAZIDE) one half tablet daily effective 11/25/2009  #30 x 2   Entered  and Authorized by:   Syliva Overman MD   Signed by:   Syliva Overman MD on 11/25/2009   Method used:   Printed then faxed to ...       [facility]* (retail)       [address]       [place], [place]  [address]       Ph: [phone]       Fax: [phone]   RxID:   [id number]   Laboratory Results   Urine Tests    Routine Urinalysis   Color: yellow Appearance: Clear Glucose: negative   (Normal Range: Negative) Bilirubin: negative   (Normal Range: Negative) Ketone: trace (5)   (Normal Range: Negative) Spec. Gravity: 1.020   (Normal Range: 1.003-1.035) Blood: moderate   (Normal Range: Negative) pH: 5.5   (Normal Range: 5.0-8.0) Protein: 30   (Normal Range: Negative) Urobilinogen: 0.2   (Normal Range: 0-1) Nitrite: negative   (Normal Range: Negative) Leukocyte Esterace: trace   (Normal Range: Negative)        Appended Document: office visit pt advised that headache seemed to be related to allergies and poor sleep, also she did c/u frequency urione was tesed

## 2010-06-08 NOTE — Assessment & Plan Note (Signed)
Summary: follow up   Vital Signs:  Patient profile:   77 year old female Menstrual status:  postmenopausal Height:      62 inches Weight:      107.25 pounds BMI:     19.69 O2 Sat:      98 % on Room air Pulse rate:   59 / minute Pulse rhythm:   regular Resp:     16 per minute BP sitting:   120 / 60  (left arm)  Vitals Entered By: Adella Hare LPN (February 08, 2010 8:20 AM)  O2 Flow:  Room air 120CC: follow-up visit Is Patient Diabetic? No Comments patient states she is still having headaches and shoulder pain   Primary Care Zelpha Messing:  Dr. Syliva Overman  CC:  follow-up visit.  History of Present Illness: Reports  that she has been oing well. her daughter reports improvement in her overall involvement in the home and less so in the community. Recently she became over involved in restructuring  her kitchen, hurt her left shoulder and is now c/o increased pain and reduced mobility for approx 2 weeks. Denies recent fever or chills. Denies sinus pressure, nasal congestion , ear pain or sore throat. Denies chest congestion, or cough productive of sputum. Denies chest pain, palpitations, PND, orthopnea or leg swelling. Denies abdominal pain, nausea, vomitting, diarrhea or constipation. Denies change in bowel movements or bloody stool. Denies dysuria , frequency, incontinence or hesitancy. . Denies headaches, vertigo, seizures. Denies depression, anxiety or insomnia. Denies  rash, lesions, or itch.     Current Medications (verified): 1)  Xanax 0.5 Mg  Tabs (Alprazolam) .... Take 1 Tablet By Mouth Once Daily 2)  Lotrel 10-20 Mg  Caps (Amlodipine Besy-Benazepril Hcl) .... Take 1 Tablet By Mouth Once A Day 3)  Oscal 500/200 D-3 500-200 Mg-Unit  Tabs (Calcium-Vitamin D) .... Take 1 Tablet By Mouth Three Times A Day 4)  Multivitamins   Tabs (Multiple Vitamin) .... Take 1 Tablet By Mouth Once A Day 5)  Restoril 30 Mg Caps (Temazepam) .... Take 1 Capsule By Mouth At Bedtime 6)   Atenolol 25 Mg Tabs (Atenolol) .... One in The Morning and  A Half in The The Afternoon 7)  Aleve .... As Needed 8)  Pantoprazole Sodium 40 Mg Tbec (Pantoprazole Sodium) .... One Cap By Mouth Two Times A Day 9)  Hydrochlorothiazide 25 Mg Tabs (Hydrochlorothiazide) .... One Half Tablet Daily Effective 11/25/2009 10)  Tylenol Extra Strength 500 Mg Tabs (Acetaminophen) .... One Tablet Twice Daily As Needed For Headache or Joint Pain, Maximum Is 8 Tablets  Per Week  Allergies (verified): 1)  ! Sulfa 2)  ! Codeine 3)  ! Pcn  Review of Systems      See HPI General:  Complains of fatigue, malaise, and sleep disorder. Eyes:  Denies discharge and eye pain. MS:  Complains of joint pain and stiffness; left shoulder pain and stiffness with reduced mobility, worse in the past month, she is involved in kitchen reorganization. Derm:  Denies itching and rash. Neuro:  Complains of headaches; denies poor balance, seizures, and sensation of room spinning; persitent intermittent headaches. Psych:  Complains of anxiety; denies depression, mental problems, suicidal thoughts/plans, thoughts of violence, and unusual visions or sounds. Endo:  Complains of cold intolerance; denies excessive thirst, excessive urination, and heat intolerance. Heme:  Denies abnormal bruising and bleeding. Allergy:  Complains of seasonal allergies; mild.  Physical Exam  General:  Well-developed,adequately nourished,in no acute distress; alert,appropriate and cooperative throughout  examination HEENT: No facial asymmetry,  EOMI, No sinus tenderness, TM's Clear, oropharynx  pink and moist.   Chest: Clear to auscultation bilaterally.  CVS: S1, S2, No murmurs, No S3.   Abd: Soft, Nontender.  MS: decreased ROM spine,left , shoulder and adequate in  knees.  Ext: No edema.   CNS: CN 2-12 intact, power and sensation normal throughout  Skin: Intact, no visible lesions or rashes.  Psych: Good eye contact, normal affect.  Memory intact, not  anxious or depressed appearing.    Impression & Recommendations:  Problem # 1:  SHOULDER PAIN, LEFT (ICD-719.41) Assessment Deteriorated  Her updated medication list for this problem includes:    Tylenol Extra Strength 500 Mg Tabs (Acetaminophen) ..... One tablet twice daily as needed for headache or joint pain, maximum is 8 tablets  per week  Orders: Radiology other (Radiology Other) Physical Therapy Referral (PT)  Problem # 2:  BLADDER CANCER (ICD-188.9) Assessment: Comment Only followed closely by urology  Problem # 3:  HYPERTENSION (ICD-401.9) Assessment: Improved  Her updated medication list for this problem includes:    Lotrel 10-20 Mg Caps (Amlodipine besy-benazepril hcl) .Marland Kitchen... Take 1 tablet by mouth once a day    Atenolol 25 Mg Tabs (Atenolol) ..... One in the morning and  a half in the the afternoon    Hydrochlorothiazide 25 Mg Tabs (Hydrochlorothiazide) ..... One half tablet daily effective 11/25/2009  Orders: T-Basic Metabolic Panel [id number])  BP today: 120/60 Prior BP: 98/56 (11/25/2009)  Labs Reviewed: K+: 4.5 (12/16/2009) Creat: : 1.03 (12/16/2009)   Chol: 220 (12/16/2009)   HDL: 78 (12/16/2009)   LDL: 132 (12/16/2009)   TG: 52 (12/16/2009)  Complete Medication List: 1)  Xanax 0.5 Mg Tabs (Alprazolam) .... Take 1 tablet by mouth once daily 2)  Lotrel 10-20 Mg Caps (Amlodipine besy-benazepril hcl) .... Take 1 tablet by mouth once a day 3)  Oscal 500/200 D-3 500-200 Mg-unit Tabs (Calcium-vitamin d) .... Take 1 tablet by mouth three times a day 4)  Multivitamins Tabs (Multiple vitamin) .... Take 1 tablet by mouth once a day 5)  Restoril 30 Mg Caps (Temazepam) .... Take 1 capsule by mouth at bedtime 6)  Atenolol 25 Mg Tabs (Atenolol) .... One in the morning and  a half in the the afternoon 7)  Aleve  .... As needed 8)  Pantoprazole Sodium 40 Mg Tbec (Pantoprazole sodium) .... One cap by mouth two times a day 9)  Hydrochlorothiazide 25 Mg Tabs  (Hydrochlorothiazide) .... One half tablet daily effective 11/25/2009 10)  Tylenol Extra Strength 500 Mg Tabs (Acetaminophen) .... One tablet twice daily as needed for headache or joint pain, maximum is 8 tablets  per week  Other Orders: T-CBC w/Diff ([id number]) T-Lipid Profile ([id number]) T-TSH ([id number]) Radiology Referral (Radiology) Influenza Vaccine MCR [id number])  Patient Instructions: 1)  Please schedule a follow-up appointment in 4 months. 2)  TSH prior to visit, ICD-9: 3)  CBC w/ Diff prior to visit, ICD-9: 4)  BMP prior to visit, ICD-9:    fasting 5)  Lipid Panel prior to visit, ICD-9: 6)  You will be referreed for mamogram and to therapy for the left shoulder. 7)  pls take excedrin one twice daily for 5 days 8)  flu vac today 9)  you are doing well, but listen to your body. Prescriptions: RESTORIL 30 MG CAPS (TEMAZEPAM) Take 1 capsule by mouth at bedtime  #30 x 3   Entered by:   Adella Hare LPN   Authorized by:  Syliva Overman MD   Signed by:   Adella Hare LPN on 91/47/8295   Method used:   Printed then faxed to ...       [facility]* (retail)       [address]       [place], [place]  [address]       Ph: [phone]       Fax: [phone]   RxID:   [id number]    Influenza Vaccine    Vaccine Type: Fluvax MCR    Site: right deltoid    Mfr: novartis    Dose: 0.5 ml    Route: IM    Given by: Mauricia Area    Exp. Date: 09/2010    Lot #: [id number]    VIS given: 11/29/09 version given February 08, 2010.

## 2010-06-08 NOTE — Progress Notes (Signed)
Summary: LEFT SHOULDER  Phone Note Call from Patient   Summary of Call: LEFT MESSAGE ABOUT DR WAS GOING TO DO SOMETHING ABOUT HER LEFT SHOULDER Initial call taken by: Lind Guest,  February 13, 2010 9:40 AM  Follow-up for Phone Call        called patient back x 2, no answer and no option to leave message  Follow-up by: Everitt Amber LPN,  February 13, 2010 11:22 AM  Additional Follow-up for Phone Call Additional follow up Details #1::        returned call, no option to leave message Additional Follow-up by: Adella Hare LPN,  February 14, 2010 11:02 AM

## 2010-06-08 NOTE — Letter (Signed)
Summary: physical therapy evaluation  physical therapy evaluation   Imported By: Lind Guest 02/22/2010 17:19:10  _____________________________________________________________________  External Attachment:    Type:   Image     Comment:   External Document

## 2010-06-08 NOTE — Assessment & Plan Note (Signed)
Summary: office visit   Vital Signs:  Patient profile:   77 year old female Menstrual status:  postmenopausal Height:      62 inches Weight:      103.50 pounds BMI:     19.00 O2 Sat:      97 % on Room air Pulse rate:   65 / minute Pulse rhythm:   regular Resp:     16 per minute BP sitting:   104 / 58  (left arm)  Vitals Entered By: Adella Hare LPN (October 11, 1608 10:56 AM)  O2 Flow:  Room air CC: follow-up visit Is Patient Diabetic? No Pain Assessment Patient in pain? no      Comments complains of left ear bothering her   Primary Care Provider:  Dr. Syliva Overman  CC:  follow-up visit.  History of Present Illness: Reports  that she has been doing fairly well. She is going out more often and has noted improvement in her anxiety and depression as well as her apetite. Her daughter is expected in the next 2 months which is also a good thing for her. Denies recent fever or chills. Denies sinus pressure, nasal congestion , ear pain or sore throat. Denies chest congestion, or cough productive of sputum. Denies chest pain, palpitations, PND, orthopnea or leg swelling. Denies abdominal pain, nausea, vomitting, diarrhea or constipation. Denies change in bowel movements or bloody stool. Denies dysuria , frequency, incontinence or hesitancy. Denies  joint pain, swelling, or reduced mobility. Denies headaches, vertigo, seizures. Denies depression, anxiety or insomnia. Denies  rash, lesions, or itch.     Current Medications (verified): 1)  Xanax 0.5 Mg  Tabs (Alprazolam) .... Take 1 Tablet By Mouth Once Daily 2)  Lotrel 10-20 Mg  Caps (Amlodipine Besy-Benazepril Hcl) .... Take 1 Tablet By Mouth Once A Day 3)  Hydrochlorothiazide 25 Mg  Tabs (Hydrochlorothiazide) .... Take 1 Tablet By Mouth Once A Day 4)  Oscal 500/200 D-3 500-200 Mg-Unit  Tabs (Calcium-Vitamin D) .... Take 1 Tablet By Mouth Three Times A Day 5)  Multivitamins   Tabs (Multiple Vitamin) .... Take 1 Tablet By  Mouth Once A Day 6)  Restoril 30 Mg Caps (Temazepam) .... Take 1 Capsule By Mouth At Bedtime 7)  Atenolol 25 Mg Tabs (Atenolol) .... One in The Morning and  A Half in The The Afternoon 8)  Aleve .... As Needed 9)  Sertraline Hcl 25 Mg Tabs (Sertraline Hcl) .... Take 1 Tablet By Mouth Once A Day 10)  Pantoprazole Sodium 40 Mg Tbec (Pantoprazole Sodium) .... One Cap By Mouth Two Times A Day  Allergies (verified): 1)  ! Sulfa 2)  ! Codeine 3)  ! Pcn  Review of Systems      See HPI General:  Complains of weight loss; pt had a new eval with dr Renae Fickle, and she found a dietitian to assist with her weight gain efforts whicjh is extremely helpful. Eyes:  Denies blurring and discharge. ENT:  Complains of earache; intermittent lef ear pain x 2 months on avg 2 to 3 times per week. Endo:  Denies cold intolerance, excessive hunger, excessive thirst, excessive urination, heat intolerance, polyuria, and weight change. Heme:  Denies abnormal bruising and bleeding. Allergy:  Complains of seasonal allergies; denies hives or rash and itching eyes; mild.  Physical Exam  General:  Well-developed,adequately nourished,in no acute distress; alert,appropriate and cooperative throughout examination HEENT: No facial asymmetry,  EOMI, No sinus tenderness, TM's Clear, oropharynx  pink and moist.  Chest: Clear to auscultation bilaterally.  CVS: S1, S2, No murmurs, No S3.   Abd: Soft, Nontender.  MS: decreased ROM spine, hips, shoulders and knees.  Ext: No edema.   CNS: CN 2-12 intact, power and sensation normal throughout  Skin: Intact, no visible lesions or rashes.  Psych: Good eye contact, normal affect.  Memory intact, not anxious or depressed appearing.    Impression & Recommendations:  Problem # 1:  HYPERTENSION (ICD-401.9) Assessment Unchanged  Her updated medication list for this problem includes:    Lotrel 10-20 Mg Caps (Amlodipine besy-benazepril hcl) .Marland Kitchen... Take 1 tablet by mouth once a day     Hydrochlorothiazide 25 Mg Tabs (Hydrochlorothiazide) .Marland Kitchen... Take 1 tablet by mouth once a day    Atenolol 25 Mg Tabs (Atenolol) ..... One in the morning and  a half in the the afternoon  Orders: T-Basic Metabolic Panel [phone])  BP today: 104/58 Prior BP: 110/60 (08/01/2009)  Labs Reviewed: K+: 3.6 (05/10/2009) Creat: : 1.07 (05/10/2009)   Chol: 221 (05/10/2009)   HDL: 68 (05/10/2009)   LDL: 138 (05/10/2009)   TG: 77 (05/10/2009)  Problem # 2:  HYPERLIPIDEMIA (ICD-272.4) Assessment: Comment Only  Labs Reviewed: SGOT: 57 (05/10/2009)   SGPT: 65 (05/10/2009)   HDL:68 (05/10/2009), 69 (12/15/2008)  LDL:138 (05/10/2009), 105 (12/15/2008)  Chol:221 (05/10/2009), 191 (12/15/2008)  Trig:77 (05/10/2009), 84 (12/15/2008), low fat diet discussed and encouraged, no meds  at this time  Problem # 3:  BLADDER CANCER (ICD-188.9) Assessment: Improved followed closely by urology  Problem # 4:  ANXIETY, CHRONIC (ICD-300.00) Assessment: Improved  Her updated medication list for this problem includes:    Xanax 0.5 Mg Tabs (Alprazolam) .Marland Kitchen... Take 1 tablet by mouth once daily    Sertraline Hcl 25 Mg Tabs (Sertraline hcl) .Marland Kitchen... Take 1 tablet by mouth once a day  Complete Medication List: 1)  Xanax 0.5 Mg Tabs (Alprazolam) .... Take 1 tablet by mouth once daily 2)  Lotrel 10-20 Mg Caps (Amlodipine besy-benazepril hcl) .... Take 1 tablet by mouth once a day 3)  Hydrochlorothiazide 25 Mg Tabs (Hydrochlorothiazide) .... Take 1 tablet by mouth once a day 4)  Oscal 500/200 D-3 500-200 Mg-unit Tabs (Calcium-vitamin d) .... Take 1 tablet by mouth three times a day 5)  Multivitamins Tabs (Multiple vitamin) .... Take 1 tablet by mouth once a day 6)  Restoril 30 Mg Caps (Temazepam) .... Take 1 capsule by mouth at bedtime 7)  Atenolol 25 Mg Tabs (Atenolol) .... One in the morning and  a half in the the afternoon 8)  Aleve  .... As needed 9)  Sertraline Hcl 25 Mg Tabs (Sertraline hcl) .... Take 1 tablet by  mouth once a day 10)  Pantoprazole Sodium 40 Mg Tbec (Pantoprazole sodium) .... One cap by mouth two times a day  Other Orders: T-Vitamin D (25-Hydroxy) ([id number])  Patient Instructions: 1)  Please schedule a follow-up appointment in 4 months. 2)  Fasting lipid and chem 7 andvit D level in 4 months 3)  You have a niormal ear exam, I will give you alleve samples to take one twice daly for the next five days, call next week if you still have pain for an ENT referral. 4)  I believe the pain is from TMJ.Marland Kitchen 5)  I am  happy that you are now out and about, keep it up! 6)  I amalso happy that yopu are working with a diettian , this will help alot. Prescriptions: RESTORIL 30 MG CAPS (TEMAZEPAM) Take 1 capsule  by mouth at bedtime  #30 x 3   Entered by:   Everitt Amber LPN   Authorized by:   Syliva Overman MD   Signed by:   Everitt Amber LPN on 09/81/1914   Method used:   Printed then faxed to ...       [facility]* (retail)       [address]       [place], [place]  [address]       Ph: [phone]       Fax: [phone]   RxID:   [id number]

## 2010-06-08 NOTE — Letter (Signed)
Summary: DR,SETHU Rito Ehrlich  DR,SETHU KRISHNAN   Imported By: Lind Guest 01/05/2010 16:21:32  _____________________________________________________________________  External Attachment:    Type:   Image     Comment:   External Document

## 2010-06-08 NOTE — Letter (Signed)
Summary: PHYSICAL THERPHY DISCHARGE  PHYSICAL THERPHY DISCHARGE   Imported By: Lind Guest 03/20/2010 16:59:39  _____________________________________________________________________  External Attachment:    Type:   Image     Comment:   External Document

## 2010-06-08 NOTE — Progress Notes (Signed)
Summary: MEDICINE  Phone Note Call from Patient   Summary of Call: NEEDS HER ALPRAZOLAM, ATENLOLOL AND AMLODIPINE REFILLED AT [facility]. WHEN DONE CALL BACK AT [phone] TO TELL HER DAUGHTER IT HAS BEN DONE Initial call taken by: Lind Guest,  February 01, 2010 10:35 AM    Prescriptions: ATENOLOL 25 MG TABS (ATENOLOL) one in the morning and  a half in the the afternoon  #45 x 3   Entered by:   Everitt Amber LPN   Authorized by:   Syliva Overman MD   Signed by:   Everitt Amber LPN on 45/40/9811   Method used:   Printed then faxed to ...       [facility]* (retail)       [address]       [place], [place]  [address]       Ph: [phone]       Fax: [phone]   RxID:   [id number] LOTREL 10-20 MG  CAPS (AMLODIPINE BESY-BENAZEPRIL HCL) Take 1 tablet by mouth once a day  #30 Each x 3   Entered by:   Everitt Amber LPN   Authorized by:   Syliva Overman MD   Signed by:   Everitt Amber LPN on 66/44/0347   Method used:   Printed then faxed to ...       [facility]* (retail)       [address]       [place], [place]  [address]       Ph: [phone]       Fax: [phone]   RxID:   [id number] Prudy Feeler 0.5 MG  TABS (ALPRAZOLAM) Take 1 tablet by mouth once daily  #30 x 3   Entered by:   Everitt Amber LPN   Authorized by:   Syliva Overman MD   Signed by:   Everitt Amber LPN on 22/06/5425   Method used:   Printed then faxed to ...       [facility]* (retail)       [address]       [place], [place]  [address]       Ph: [phone]       Fax: [phone]   RxID:   [id number]

## 2010-06-08 NOTE — Assessment & Plan Note (Signed)
Summary: 6 MO FU REMINDER-SRS   Visit Type:  Follow-up Primary Provider:  Dr. Syliva Overman   History of Present Illness: 77 year old woman presents for a followup visit. She reports occasional palpitations, no chest pain or progressive breathlessness.  She does have reflux symptoms and takes Zantac on a regular basis.  She has not been exercising regularly, and we talked about initiating a walking regimen.  Current Medications (verified): 1)  Xanax 0.5 Mg  Tabs (Alprazolam) .... Take 1 Tablet By Mouth Once Daily 2)  Lotrel 10-20 Mg  Caps (Amlodipine Besy-Benazepril Hcl) .... Take 1 Tablet By Mouth Once A Day 3)  Hydrochlorothiazide 25 Mg  Tabs (Hydrochlorothiazide) .... Take 1 Tablet By Mouth Once A Day 4)  Oscal 500/200 D-3 500-200 Mg-Unit  Tabs (Calcium-Vitamin D) .... Take 1 Tablet By Mouth Three Times A Day 5)  Multivitamins   Tabs (Multiple Vitamin) .... Take 1 Tablet By Mouth Once A Day 6)  Restoril 30 Mg Caps (Temazepam) .... Take 1 Capsule By Mouth At Bedtime 7)  Atenolol 25 Mg Tabs (Atenolol) .... One in The Morning and  A Half in The The Afternoon 8)  Flonase 50 Mcg/act Susp (Fluticasone Propionate) .... As Needed 9)  Zantac 150 Mg Tabs (Ranitidine Hcl) .... Take 1 Tablet By Mouth Once A Day 10)  Aleve .... As Needed  Allergies (verified): 1)  ! Sulfa 2)  ! Codeine 3)  ! Pcn  Comments:  Nurse/Medical Assistant: The patient is currently on medications but does not know the name or dosage at this time. Instructed to contact our office with details. Will update medication list at that time.  Past History:  Social History: Last updated: 07/11/2009 Retired Never Smoked Alcohol use-no Drug use-no Widowed   Past Medical History: Palpitations - PAC's Hypertension Normal coronary arteries at catheterization 2003 Anxiety Bladder cancer [facility]  Past Surgical History: Cholecystectomy 1998 Bladder surgery 2009  Clinical Review  Panels:  Echocardiogram Echocardiogram  2D Measurements   LEFT VENTRICLE                NORMAL   LVID ed (chordal)  35    mm   36-56 mm   LVID es (chordal)  25.4  mm   --   FS (chordal)       27    %    28-44%   IVS ed             7.7   mm   6-11 mm   LVPW ed            7.4   mm   6-11 mm   AORTA                         NORMAL   AoD (root)         25    mm   <33mm   LEFT ATRIUM                   NORMAL   LAD                27    mm   19-16mm    M-mode Measurements   AORTA                         NORMAL   AoD (root)         25  mm   20-73mm  SUMMARY   -  Overall left ventricular systolic function was normal. There were         no left ventricular regional wall motion abnormalities.   -  There was mild fibrocalcific change of the aortic root.   -  Borderline right ventricular hypertrophy. (05/10/2008)    Family History: Father: died age 54 Mother: died age 70 with pneumonia, diabetes, hypertension No obvious premature cardiovascular disease  Social History: Retired Never Smoked Alcohol use-no Drug use-no Widowed   Review of Systems  The patient denies anorexia, fever, weight loss, chest pain, syncope, dyspnea on exertion, peripheral edema, prolonged cough, melena, and hematochezia.         Otherwise reviewed and negative.  Vital Signs:  Patient profile:   77 year old female Menstrual status:  postmenopausal Height:      62 inches Weight:      99 pounds Pulse rate:   53 / minute BP sitting:   117 / 55  (left arm) Cuff size:   regular  Vitals Entered By: Carlye Grippe (July 11, 2009 1:39 PM)  Physical Exam  Additional Exam:  Thin woman in no acute distress. HEENT: Conjunctiva and lids normal, oropharynx with moist mucosa. Neck: Supple, no bruits. Lungs: Clear to auscultation, nonlabored. Cardiac: Regular rate and rhythm, rare ectopic beat, no significant systolic murmur. Extremities: No pitting edema.   EKG  Procedure date:   07/11/2009  Findings:      Sinus bradycardia with left atrial enlargement, nonspecific ST changes, incorrect lead placement V6.  Impression & Recommendations:  Problem # 1:  PALPITATIONS (ICD-785.1)  No recent progression. Plan to continue present dose of atenolol. I have recommended a walking regimen as well. Followup in 6 months.  Her updated medication list for this problem includes:    Lotrel 10-20 Mg Caps (Amlodipine besy-benazepril hcl) .Marland Kitchen... Take 1 tablet by mouth once a day    Atenolol 25 Mg Tabs (Atenolol) ..... One in the morning and  a half in the the afternoon  Problem # 2:  PREMATURE ATRIAL CONTRACTIONS (ICD-427.61)  Previously documented by cardiac monitoring.  Her updated medication list for this problem includes:    Lotrel 10-20 Mg Caps (Amlodipine besy-benazepril hcl) .Marland Kitchen... Take 1 tablet by mouth once a day    Atenolol 25 Mg Tabs (Atenolol) ..... One in the morning and  a half in the the afternoon  Other Orders: EKG w/ Interpretation (93000)  Patient Instructions: 1)  Your physician wants you to follow-up in: 6 months. You will receive a reminder letter in the mail one-two months in advance. If you don't receive a letter, please call our office to schedule the follow-up appointment. 2)  Your physician recommends that you continue on your current medications as directed. Please refer to the Current Medication list given to you today.

## 2010-06-08 NOTE — Assessment & Plan Note (Signed)
Summary: left leg pain with activity- room 1   Vital Signs:  Patient profile:   77 year old female Menstrual status:  postmenopausal Height:      62 inches Weight:      101 pounds BMI:     18.54 O2 Sat:      99 % on Room air Pulse rate:   61 / minute Resp:     16 per minute BP sitting:   98 / 60  (left arm)  Vitals Entered By: Adella Hare LPN (July 04, 2009 8:30 AM) CC: left leg pain with activity only Is Patient Diabetic? No Pain Assessment      Location: left leg   Primary Provider:  Lodema Hong  CC:  left leg pain with activity only.  History of Present Illness: Pt c/o pain in her Lt lower leg with walking for "awhile".  She states that the pain is in her veins & not in the muscles or bones. She denies any swelling in her legs or ankles.  No redness.  No trauma. No pain at rest.  She has had plebitis in the past, but states that this is different.  She wears hosery daily, but has not tried a compression stocking.  Pt has hx of HTN.  Has been well controlled.  No chest pain, palpitations, dizzy or lightheadedness.  She does not need refill of her meds today.         Current Medications (verified): 1)  Xanax 0.5 Mg  Tabs (Alprazolam) .... Take 1 Tablet By Mouth Once Daily 2)  Lotrel 10-20 Mg  Caps (Amlodipine Besy-Benazepril Hcl) .... Take 1 Tablet By Mouth Once A Day 3)  Hydrochlorothiazide 25 Mg  Tabs (Hydrochlorothiazide) .... Take 1 Tablet By Mouth Once A Day 4)  Oscal 500/200 D-3 500-200 Mg-Unit  Tabs (Calcium-Vitamin D) .... Take 1 Tablet By Mouth Three Times A Day 5)  Multivitamins   Tabs (Multiple Vitamin) .... Take 1 Tablet By Mouth Once A Day 6)  Restoril 30 Mg Caps (Temazepam) .... Take 1 Capsule By Mouth At Bedtime 7)  Atenolol 25 Mg Tabs (Atenolol) .... One in The Morning and  A Half in The The Afternoon 8)  Flonase 50 Mcg/act Susp (Fluticasone Propionate) .... As Needed 9)  Zantac 150 Mg Tabs (Ranitidine Hcl) .... Take 1 Tablet By Mouth Once A Day 10)   Aleve .... As Needed  Allergies (verified): 1)  ! Sulfa 2)  ! Codeine 3)  ! Pcn  Past History:  Past medical, surgical, family and social histories (including risk factors) reviewed for relevance to current acute and chronic problems.  Past Medical History: Reviewed history from 08/25/2008 and no changes required. HYPERLIPIDEMIA (ICD-272.4) GERD (ICD-530.81) ANXIETY, CHRONIC (ICD-300.00) PALPITATIONS, HX OF (ICD-V12.50) HYPERTENSION (ICD-401.9)  Past Surgical History: Reviewed history from 02/03/2008 and no changes required. Cholecystectomy 1998 Bladder surgery for growth 2009 malignant  Family History: Reviewed history from 08/25/2008 and no changes required. ONE DECEASED CHILD AT 20 MENINGITIS THREE DAUGHTERS LIVING TWO HEALTH UNKNOWN/ ONE MENTALLY RETARDED MOTHER DECEASED 85 WITH PNEUMONIA HAD  DIABETES AND HYPERTENSION FATHER DECEASED AT 75 UNKNOWN THREE LIVING SISTERS TWO DIABETIC AND ONE HYPERTENSIVE TWO SISTER DECEASED ONE AT 72 UNKNOWN ONE SISTER DECEASED AT 46 STROKE ONE BROTHER LIVING 83 HYPERTENSIVE AND DIABETIC ONE BROTHER DECEASED AT 80 CORONARY  ARTERY DISEASE  Social History: Reviewed history from 07/25/2007 and no changes required. WIDOW Retired Never Smoked Alcohol use-no Drug use-no  Review of Systems General:  Denies chills and  fever. CV:  Denies chest pain or discomfort and swelling of feet. Resp:  Denies shortness of breath. MS:  Denies joint pain, joint redness, joint swelling, muscle aches, and muscle weakness. Derm:  Denies lesion(s) and rash. Neuro:  Denies numbness and tingling.  Physical Exam  General:  Well-developed,well-nourished,in no acute distress; alert,appropriate and cooperative throughout examination Ears:  External ear exam shows no significant lesions or deformities.  Otoscopic examination reveals clear canals, tympanic membranes are intact bilaterally without bulging, retraction, inflammation or discharge. Hearing is grossly  normal bilaterally. Nose:  External nasal examination shows no deformity or inflammation. Nasal mucosa are pink and moist without lesions or exudates. Mouth:  Oral mucosa and oropharynx without lesions or exudates.  Teeth in good repair. Neck:  No deformities, masses, or tenderness noted. Lungs:  Normal respiratory effort, chest expands symmetrically. Lungs are clear to auscultation, no crackles or wheezes. Heart:  Normal rate and regular rhythm. S1 and S2 normal without gallop, murmur, click, rub or other extra sounds. Msk:  no joint tenderness and no joint swelling.   Pulses:  L posterior tibial normal and L dorsalis pedis normal.   Extremities:  No clubbing, cyanosis, edema, or deformity noted with normal full range of motion of all joints.   Neurologic:  alert & oriented X3, sensation intact to light touch, and gait normal.   Skin:  Intact without suspicious lesions or rashes.  Visible varicose veins noted.  They are not swollen & are nontender.  Cervical Nodes:  No lymphadenopathy noted Psych:  Cognition and judgment appear intact. Alert and cooperative with normal attention span and concentration. No apparent delusions, illusions, hallucinations   Impression & Recommendations:  Problem # 1:  VARICOSE VEINS, LOWER EXTREMITIES (ICD-454.9) Assessment Deteriorated Discussed varicose veins. Will try Rx support stocking.  If doesnt help & continues to have discomfort will need referral to vascular specialist.  Problem # 2:  HYPERTENSION (ICD-401.9) Assessment: Improved  Her updated medication list for this problem includes:    Lotrel 10-20 Mg Caps (Amlodipine besy-benazepril hcl) .Marland Kitchen... Take 1 tablet by mouth once a day    Hydrochlorothiazide 25 Mg Tabs (Hydrochlorothiazide) .Marland Kitchen... Take 1 tablet by mouth once a day    Atenolol 25 Mg Tabs (Atenolol) ..... One in the morning and  a half in the the afternoon  BP today: 98/60 Prior BP: 130/60 (06/09/2009)  Labs Reviewed: K+: 3.6  (05/10/2009) Creat: : 1.07 (05/10/2009)   Chol: 221 (05/10/2009)   HDL: 68 (05/10/2009)   LDL: 138 (05/10/2009)   TG: 77 (05/10/2009)  Complete Medication List: 1)  Xanax 0.5 Mg Tabs (Alprazolam) .... Take 1 tablet by mouth once daily 2)  Lotrel 10-20 Mg Caps (Amlodipine besy-benazepril hcl) .... Take 1 tablet by mouth once a day 3)  Hydrochlorothiazide 25 Mg Tabs (Hydrochlorothiazide) .... Take 1 tablet by mouth once a day 4)  Oscal 500/200 D-3 500-200 Mg-unit Tabs (Calcium-vitamin d) .... Take 1 tablet by mouth three times a day 5)  Multivitamins Tabs (Multiple vitamin) .... Take 1 tablet by mouth once a day 6)  Restoril 30 Mg Caps (Temazepam) .... Take 1 capsule by mouth at bedtime 7)  Atenolol 25 Mg Tabs (Atenolol) .... One in the morning and  a half in the the afternoon 8)  Flonase 50 Mcg/act Susp (Fluticasone propionate) .... As needed 9)  Zantac 150 Mg Tabs (Ranitidine hcl) .... Take 1 tablet by mouth once a day 10)  Aleve  .... As needed  Patient Instructions: 1)  Use  support stockings for the discomfort in your leg due to  your varicose veins.  You do not need to sleep in them. 2)  If your Leg continues to bother you wearing the stockings then we will refer you to a vein specialist. 3)  Keep your appt with Dr Lodema Hong 08/01/09.  Appended Document: left leg pain with activity- room 1 Pt was prescribed a mod compression support stocking for her Lt leg.  Rx given to pt.

## 2010-06-08 NOTE — Progress Notes (Signed)
Summary: reflux rx  Phone Note Call from Patient Call back at Home Phone [phone]   Caller: Patient Summary of Call: pt called- wants to know if she can have an Rx for her reflux. she is currently taking zantac and has failed Aciphex and Nexium. would like something called in to [facility]. Please advise.  Initial call taken by: Hendricks Limes LPN,  July 15, 2009 9:17 AM     Appended Document: reflux rx Try Dexilant 60 mg daily X 3 weeks (samples from office).  Need to go ahead and set up TCS with possible EGD / hmocult positive stool -distant prior TCS  Appended Document: reflux rx Called and could not leave a VM.  Appended Document: reflux rx Pt informed. She will come by for the samples and get triaged for the TCS when she comes in.  Appended Document: reflux rx Pt did not let me know when she came in for samples. I called her to ask about triaging and she informed me that she was going to check with Dr. Gabriel Cirri about doing her TCS.

## 2010-06-08 NOTE — Assessment & Plan Note (Signed)
Summary: 6 MO F/U PER SEPT REMINDER-JM   Visit Type:  Follow-up Primary Provider:  Dr. Syliva Overman   History of Present Illness: 77 year old woman presents for followup. I saw her in consultation recently at [facility] in November following an episode of chest pain as well as palpitations. She ruled out for myocardial infarction. Followup cardiac testing is outlined below, reassuring overall, and she was managed medically.  She is here with her daughter. She reports no progressive palpitations or chest pain. States that she has been compliant with her medications, outlined below.  She states that Dr. Lodema Hong plans a followup chest CT sometime in March to review the previously documented small right lower lobe nodule.   Preventive Screening-Counseling & Management  Alcohol-Tobacco     Smoking Status: never  Current Medications (verified): 1)  Xanax 0.5 Mg  Tabs (Alprazolam) .... Take 1 Tablet By Mouth Once Daily 2)  Oscal 500/200 D-3 500-200 Mg-Unit  Tabs (Calcium-Vitamin D) .... Take 1 Tablet By Mouth Three Times A Day 3)  Multivitamins   Tabs (Multiple Vitamin) .... Take 1 Tablet By Mouth Once A Day 4)  Restoril 30 Mg Caps (Temazepam) .... Take 1 Capsule By Mouth At Bedtime 5)  Aleve .... As Needed 6)  Pantoprazole Sodium 40 Mg Tbec (Pantoprazole Sodium) .... One Cap By Mouth Two Times A Day 7)  Amlodipine Besy-Benazepril Hcl 10-20 Mg Caps (Amlodipine Besy-Benazepril Hcl) .... Take 1 Capsule By Mouth Once A Day At 5pm 8)  Atenolol 25 Mg Tabs (Atenolol) .... Take 1 Tablet By Mouth Once A Day At 8am Ever Morning 9)  Tylenol Extra Strength 500 Mg Tabs (Acetaminophen) .... Take 1 Tablet By Mouth Two Times A Day As Needed For Joint Pain  Allergies (verified): 1)  ! Sulfa 2)  ! Codeine 3)  ! Pcn  Comments:  Nurse/Medical Assistant: The patient's medication bottles and allergies were reviewed with the patient and were updated in the Medication and Allergy Lists.  Past  History:  Social History: Last updated: 07/11/2009 Retired Never Smoked Alcohol use-no Drug use-no Widowed   Past Medical History: Palpitations - PAC's Hypertension Normal coronary arteries at catheterization 2003 Anxiety Bladder cancer [facility] 4 mm right lower lobe pulmonary nodule - chest CT 11/11  Review of Systems  The patient denies anorexia, fever, weight loss, chest pain, syncope, dyspnea on exertion, and peripheral edema.         No cough or hemoptysis. Otherwise reviewed and negative.  Vital Signs:  Patient profile:   77 year old female Menstrual status:  postmenopausal Height:      62 inches Weight:      108 pounds Pulse rate:   57 / minute BP sitting:   118 / 69  (left arm) Cuff size:   regular  Vitals Entered By: Carlye Grippe (May 05, 2010 1:22 PM)  Physical Exam  Additional Exam:  Thin woman in no acute distress. HEENT: Conjunctiva and lids normal, oropharynx with moist mucosa. Neck: Supple, no bruits. Lungs: Clear to auscultation, nonlabored. Cardiac: Regular rate and rhythm, rare ectopic beat, no significant systolic murmur. Extremities: No pitting edema.   Echocardiogram  Procedure date:  04/04/2010  Findings:       Study Conclusions    - Left ventricle: The cavity size was normal. Wall thickness was     normal. Systolic function was vigorous. The estimated ejection     fraction was in the range of 65% to 70%. Wall motion was normal;  there were no regional wall motion abnormalities. Doppler     parameters are consistent with abnormal left ventricular     relaxation (grade 1 diastolic dysfunction).   - Aortic valve: Mildly calcified annulus. Trileaflet.   - Mitral valve: Calcified annulus. Trivial regurgitation.   - Atrial septum: The septum bowed from left to right, consistent     with increased left atrial pressure. No defect or patent foramen     ovale was identified.   - Tricuspid valve: Trivial regurgitation.   -  Pericardium, extracardiac: There was no pericardial effusion.  Nuclear Study  Procedure date:  04/04/2010  Findings:      Scintigraphic Data: Analysis of the raw perfusion data finds   adequate ratio of radiotracer uptake.    Tomographic views were obtained using the short axis, vertical long   axis, and horizontal long axis planes.  Normal perfusion is noted   on stress imaging with no reversible perfusion defects to indicate   ischemia.    Gated imaging reveals an EDV of 36, ESV of 5, T I D ratio of 0.9,   and LVEF of 86% without focal wall motion abnormality.    IMPRESSION:   Normal Lexiscan Myoview as outlined.  No diagnostic ST-segment   changes were noted.  Normal stress perfusion imaging without   reversible defects to indicate ischemia.  LVEF hyperdynamic at 86%.   Impression & Recommendations:  Problem # 1:  PALPITATIONS (ICD-785.1)  Well controlled on present medications.  Her updated medication list for this problem includes:    Amlodipine Besy-benazepril Hcl 10-20 Mg Caps (Amlodipine besy-benazepril hcl) .Marland Kitchen... Take 1 capsule by mouth once a day at 5pm    Atenolol 25 Mg Tabs (Atenolol) .Marland Kitchen... Take 1 tablet by mouth once a day at 8am ever morning  Problem # 2:  HYPERTENSION (ICD-401.9)  Blood pressure well controlled today.  Her updated medication list for this problem includes:    Amlodipine Besy-benazepril Hcl 10-20 Mg Caps (Amlodipine besy-benazepril hcl) .Marland Kitchen... Take 1 capsule by mouth once a day at 5pm    Atenolol 25 Mg Tabs (Atenolol) .Marland Kitchen... Take 1 tablet by mouth once a day at 8am ever morning  Problem # 3:  PULMONARY NODULE (ICD-518.89)  Small right lower lobe pulmonary nodule, followed by Dr. Lodema Hong, with plan for repeat chest CT scan in March  Patient Instructions: 1)  Your physician wants you to follow-up in: 6 months. You will receive a reminder letter in the mail one-two months in advance. If you don't receive a letter, please call our office to  schedule the follow-up appointment. 2)  Your physician recommends that you continue on your current medications as directed. Please refer to the Current Medication list given to you today.

## 2010-06-08 NOTE — Progress Notes (Signed)
Summary: PT  Phone Note Call from Patient   Summary of Call: INSTEAD OF DOING PT AT [facility] SHE WANTS TO GO TO [facility] OR [facility] SHE WENT LAST TIME  Initial call taken by: Lind Guest,  February 09, 2010 11:03 AM  Follow-up for Phone Call        will refer pt to [facility] for physical therapy Follow-up by: Rudene Anda,  February 09, 2010 2:20 PM

## 2010-06-08 NOTE — Letter (Signed)
Summary: COMPRESSION HOSIERY  COMPRESSION HOSIERY   Imported By: Lind Guest 07/06/2009 11:41:12  _____________________________________________________________________  External Attachment:    Type:   Image     Comment:   External Document

## 2010-06-08 NOTE — Assessment & Plan Note (Signed)
Summary: 45m follow with RMR- per [facility]   Visit Type:  Follow-up Visit Primary Care Provider:  Lodema Hong  Chief Complaint:  F/U  Genella Rife.  History of Present Illness: 77 year old lady here for followup of dyspepsia and GERD. She tried Nexium and AcipHex neither of which have helped. She's gone on Zantac 150 mg each morning and this is helped more than anything else although she does wake up with some reflux of a bit of a sore throat. No dysphagia. Prior EGD demonstrated some nonspecific erosions. There was no evidence of H. pylori. She has lost 3 pounds and she was seen in November of this year. States she just not hungry all the time. However, she denies early satiety hematochezia melena dysphagia or nausea/vomiting. She tells me Dr. Gabriel Cirri did a colonoscopy on her last  in 2004. She is unsure when she is due for another examination. No Family history polyps or cancer. She denies any personal history of polyps    Current Medications (verified): 1)  Xanax 0.5 Mg  Tabs (Alprazolam) .... Take 1 Tablet By Mouth Once Daily 2)  Lotrel 10-20 Mg  Caps (Amlodipine Besy-Benazepril Hcl) .... Take 1 Tablet By Mouth Once A Day 3)  Hydrochlorothiazide 25 Mg  Tabs (Hydrochlorothiazide) .... Take 1 Tablet By Mouth Once A Day 4)  Oscal 500/200 D-3 500-200 Mg-Unit  Tabs (Calcium-Vitamin D) .... Take 1 Tablet By Mouth Three Times A Day 5)  Multivitamins   Tabs (Multiple Vitamin) .... Take 1 Tablet By Mouth Once A Day 6)  Restoril 30 Mg Caps (Temazepam) .... Take 1 Capsule By Mouth At Bedtime 7)  Atenolol 25 Mg Tabs (Atenolol) .... One in The Morning and  A Half in The The Afternoon 8)  Flonase 50 Mcg/act Susp (Fluticasone Propionate) .... As Needed 9)  Zantac 150 Mg Tabs (Ranitidine Hcl) .... Take 1 Tablet By Mouth Once A Day 10)  Aleve .... As Needed  Allergies (verified): 1)  ! Sulfa 2)  ! Codeine 3)  ! Pcn  Past History:  Past Medical History: Last updated: 09-14-08 HYPERLIPIDEMIA  (ICD-272.4) GERD (ICD-530.81) ANXIETY, CHRONIC (ICD-300.00) PALPITATIONS, HX OF (ICD-V12.50) HYPERTENSION (ICD-401.9)  Past Surgical History: Last updated: 02/03/2008 Cholecystectomy 1998 Bladder surgery for growth 2009 malignant  Family History: Last updated: 09/14/08 ONE DECEASED CHILD AT 18 MENINGITIS THREE DAUGHTERS LIVING TWO HEALTH UNKNOWN/ ONE MENTALLY RETARDED MOTHER DECEASED 85 WITH PNEUMONIA HAD  DIABETES AND HYPERTENSION FATHER DECEASED AT 51 UNKNOWN THREE LIVING SISTERS TWO DIABETIC AND ONE HYPERTENSIVE TWO SISTER DECEASED ONE AT 72 UNKNOWN ONE SISTER DECEASED AT 69 STROKE ONE BROTHER LIVING 83 HYPERTENSIVE AND DIABETIC ONE BROTHER DECEASED AT 80 CORONARY  ARTERY DISEASE  Social History: Last updated: 07/25/2007 [employer] Retired Never Smoked Alcohol use-no Drug use-no  Vital Signs:  Patient profile:   77 year old female Menstrual status:  postmenopausal Height:      62 inches Weight:      99 pounds BMI:     18.17 Temp:     97.5 degrees F oral Pulse rate:   56 / minute BP sitting:   130 / 60  (right arm) Cuff size:   regular  Vitals Entered By: Cloria Spring LPN (June 09, 2009 2:38 PM)  Physical Exam  General:  small frame somewhat fraile 77 year old lady resting comfortably accompanied by her friend, Corrie Dandy. Eyes:  no scleral icterus. Conjunctiva are pink Lungs:  clear to auscultation Heart:  regular rhythm without murmur gallop rub Abdomen:  nondistended positive bowel sounds  soft nontender blood pressure measured in my  Impression & Recommendations: Impression: A 77 year old lady with a history of gastric erosion/dyspepsia symptoms previously much improved on acid suppression therapy. However, reflux component not felt to be improved. Consequently, patient's started  taking en H2 blocker on at present she perceives excellent, or at least, marked improvement in her symptoms. She may have a nocturnal component of reflux. She has lost a little weight  recently which is nonspecific.  Recommendations: #1 increase Zantac 150 mg at bedtime and a second dose in the morning. I did talk about a relatively weak effect these agents have been the potential for tachyphylaxix over time.  She she is happy with her response to Zantac and will allow her to take this for now.  #2 we'll send her home with one stoll card to return for occult blood.   #3 touch base with Dr. Lodema Hong regarding the timing of her next colonoscopy. We'll be glad to help her if she desires come here for that study. Otherwise, we'll plan to see her back in 6 months      Appended Document: Orders Update-charge    Clinical Lists Changes  Problems: Added new problem of DYSPHAGIA ([id number]-841.32) Orders: Added new Service order of Est. Patient Level IV (44010) - Signed

## 2010-06-08 NOTE — Progress Notes (Signed)
Summary: Fast HeartRate  Phone Note Call from Patient Call back at [facility] Phone [phone]   Summary of Call: Pt called stating heart is beating fast. She hasn't actually checked the rate but it is beating fast. She states it started last night and is still beating fast today. She states her medicines that usually control rate haven't helped. She has an appt for 3/11 with Dr. Diona Browner. Notified pt to go to ER to have rate evaluated. Pt verbalized understanding.  Initial call taken by: Cyril Loosen, RN, BSN,  June 01, 2009 10:04 AM

## 2010-06-08 NOTE — Progress Notes (Signed)
  Phone Note Call from Patient   Caller: Patient Summary of Call: patient states she has had some nausea and vomitting this morning, wants somthing called in offered ov today with PA and patient declined, states she will try somthing she has at home Initial call taken by: Adella Hare LPN,  December 05, 2009 11:09 AM  Follow-up for Phone Call        noted Follow-up by: Syliva Overman MD,  December 05, 2009 12:14 PM

## 2010-06-08 NOTE — Procedures (Signed)
Summary: Gastroenterology  Gastroenterology   Imported By: Lind Guest 08/09/2009 11:13:35  _____________________________________________________________________  External Attachment:    Type:   Image     Comment:   External Document

## 2010-06-08 NOTE — Assessment & Plan Note (Signed)
Summary: [name]   Vital Signs:  Patient profile:   77 year old female Menstrual status:  postmenopausal Height:      62 inches Weight:      105.50 pounds BMI:     19.37 O2 Sat:      98 % on Room air Pulse rate:   55 / minute Pulse rhythm:   regular Resp:     16 per minute BP sitting:   100 / 60  (left arm)  Vitals Entered By: Adella Hare LPN (April 26, 2010 8:54 AM)  O2 Flow:  Room air CC: follow-up visit Is Patient Diabetic? No   Primary Care Provider:  Dr. Syliva Overman  CC:  follow-up visit.  History of Present Illness: Pt in primarily for re-eval of uncontrolled HTN. She denies light headedness, cough or cramping. She feels better than she had been reportedlyShe and her daughtwer have questions about a f/u chest scan for a pulmonary nodule, and request that it be done sooner rather than later esp ion light of her dx of bladder cancer which I agree is reasonable. Reports  that she has been doing   well. Denies recent fever or chills. Denies sinus pressure, nasal congestion , ear pain or sore throat. Denies chest congestion, or cough productive of sputum. Denies chest pain, palpitations, PND, orthopnea or leg swelling. Denies abdominal pain, nausea, vomitting, diarrhea or constipation. Denies change in bowel movements or bloody stool. Denies dysuria , frequency, incontinence or hesitancy.  Denies  rash, lesions, or itch.     Current Medications (verified): 1)  Xanax 0.5 Mg  Tabs (Alprazolam) .... Take 1 Tablet By Mouth Once Daily 2)  Oscal 500/200 D-3 500-200 Mg-Unit  Tabs (Calcium-Vitamin D) .... Take 1 Tablet By Mouth Three Times A Day 3)  Multivitamins   Tabs (Multiple Vitamin) .... Take 1 Tablet By Mouth Once A Day 4)  Restoril 30 Mg Caps (Temazepam) .... Take 1 Capsule By Mouth At Bedtime 5)  Aleve .... As Needed 6)  Pantoprazole Sodium 40 Mg Tbec (Pantoprazole Sodium) .... One Cap By Mouth Two Times A Day 7)  Tylenol Extra Strength 500 Mg Tabs  (Acetaminophen) .... One Tablet Twice Daily As Needed For Headache or Joint Pain, Maximum Is 8 Tablets  Per Week 8)  Amlodipine Besy-Benazepril Hcl 10-20 Mg Caps (Amlodipine Besy-Benazepril Hcl) .... Take 1 Capsule By Mouth Once A Day At 5pm 9)  Atenolol 25 Mg Tabs (Atenolol) .... Take 1 Tablet By Mouth Once A Day At 8am Ever Morning 10)  Hydrochlorothiazide 12.5 Mg Tabs (Hydrochlorothiazide) .... Take 1 Tablet By Mouth Once A Day At 8 Am Every Morning  Allergies (verified): 1)  ! Sulfa 2)  ! Codeine 3)  ! Pcn  Review of Systems      See HPI Eyes:  Denies discharge and red eye. MS:  Complains of joint pain; left shoulder pain x 2 weeks, no precipitating traauma. Neuro:  Complains of headaches; denies seizures and sensation of room spinning; intermittent often related to exaccerbation of allergies. Endo:  Denies cold intolerance, excessive hunger, excessive thirst, and excessive urination. Heme:  Denies abnormal bruising and bleeding. Allergy:  Complains of seasonal allergies; denies hives or rash, itching eyes, and persistent infections.  Physical Exam  General:  Well-developed,well-nourished,in no acute distress; alert,appropriate and cooperative throughout examination HEENT: No facial asymmetry,  EOMI, No sinus tenderness, TM's Clear, oropharynx  pink and moist.   Chest: Clear to auscultation bilaterally.  CVS: S1, S2, No murmurs, No S3.  Abd: Soft, Nontender.  MS: Adequate ROM spine, hips,  and knees. decreased in left shoulder. Ext: No edema.   CNS: CN 2-12 intact, power tone and sensation normal throughout.   Skin: Intact, no visible lesions or rashes.  Psych: Good eye contact, normal affect.  Memory intact, not anxious or depressed appearing.    Impression & Recommendations:  Problem # 1:  PULMONARY NODULE (ICD-518.89) Assessment Comment Only  Orders: Radiology Referral (Radiology)  Problem # 2:  HYPERTENSION (ICD-401.9) Assessment: Improved  The following  medications were removed from the medication list:    Lotrel 10-20 Mg Caps (Amlodipine besy-benazepril hcl) .Marland Kitchen... Take 1 tablet by mouth once a day    Atenolol 25 Mg Tabs (Atenolol) ..... One in the morning and  a half in the the afternoon    Hydrochlorothiazide 12.5 Mg Tabs (Hydrochlorothiazide) .Marland Kitchen... Take 1 tablet by mouth once a day at 8 am every morning Her updated medication list for this problem includes:    Amlodipine Besy-benazepril Hcl 10-20 Mg Caps (Amlodipine besy-benazepril hcl) .Marland Kitchen... Take 1 capsule by mouth once a day at 5pm    Atenolol 25 Mg Tabs (Atenolol) .Marland Kitchen... Take 1 tablet by mouth once a day at 8am ever morning  BP today: 100/60, overcorrected Prior BP: 190/108 (04/10/2010)  Labs Reviewed: K+: 4.2 (02/28/2010) Creat: : 0.98 (02/28/2010)   Chol: 193 (02/28/2010)   HDL: 70 (02/28/2010)   LDL: 109 (02/28/2010)   TG: 72 (02/28/2010)  Problem # 3:  ANXIETY, CHRONIC (ICD-300.00) Assessment: Improved  Her updated medication list for this problem includes:    Xanax 0.5 Mg Tabs (Alprazolam) .Marland Kitchen... Take 1 tablet by mouth once daily  Problem # 4:  SHOULDER PAIN, LEFT (ICD-719.41) Assessment: Deteriorated  Her updated medication list for this problem includes:    Tylenol Extra Strength 500 Mg Tabs (Acetaminophen) ..... One tablet twice daily as needed for headache or joint pain, maximum is 8 tablets  per week    Tylenol Extra Strength 500 Mg Tabs (Acetaminophen) .Marland Kitchen... Take 1 tablet by mouth two times a day as needed for joint pain  Orders: Medicare Electronic Prescription [phone])  Complete Medication List: 1)  Xanax 0.5 Mg Tabs (Alprazolam) .... Take 1 tablet by mouth once daily 2)  Oscal 500/200 D-3 500-200 Mg-unit Tabs (Calcium-vitamin d) .... Take 1 tablet by mouth three times a day 3)  Multivitamins Tabs (Multiple vitamin) .... Take 1 tablet by mouth once a day 4)  Restoril 30 Mg Caps (Temazepam) .... Take 1 capsule by mouth at bedtime 5)  Aleve  .... As needed 6)   Pantoprazole Sodium 40 Mg Tbec (Pantoprazole sodium) .... One cap by mouth two times a day 7)  Tylenol Extra Strength 500 Mg Tabs (Acetaminophen) .... One tablet twice daily as needed for headache or joint pain, maximum is 8 tablets  per week 8)  Amlodipine Besy-benazepril Hcl 10-20 Mg Caps (Amlodipine besy-benazepril hcl) .... Take 1 capsule by mouth once a day at 5pm 9)  Atenolol 25 Mg Tabs (Atenolol) .... Take 1 tablet by mouth once a day at 8am ever morning 10)  Tylenol Extra Strength 500 Mg Tabs (Acetaminophen) .... Take 1 tablet by mouth two times a day as needed for joint pain  Patient Instructions: 1)  F/U as before. 2)  You need to STOP HCTZ efective today pls, your BP is 100/60, it does not need to be this low. 3)  Chest Ct scan will be scheduled for end March Prescriptions: ATENOLOL 25 MG TABS (  ATENOLOL) Take 1 tablet by mouth once a day at 8am ever morning  #30 x 3   Entered by:   Adella Hare LPN   Authorized by:   Syliva Overman MD   Signed by:   Adella Hare LPN on 57/84/6962   Method used:   Electronically to        [facility]* (retail)       [address]       [place], [place]  [address]       Ph: [phone]       Fax: [phone]   RxID:   [id number] TYLENOL EXTRA STRENGTH 500 MG TABS (ACETAMINOPHEN) Take 1 tablet by mouth two times a day as needed for joint pain  #40 x 3   Entered and Authorized by:   Syliva Overman MD   Signed by:   Syliva Overman MD on 04/26/2010   Method used:   Electronically to        [facility]* (retail)       [address]       [place], [place]  [address]       Ph: [phone]       Fax: [phone]   RxID:   [id number]    Orders Added: 1)  Est. Patient Level IV [83151] 2)  Medicare Electronic Prescription [V6160] 3)  Radiology Referral [Radiology]

## 2010-06-08 NOTE — Progress Notes (Signed)
Summary: percert number  Phone Note Call from Patient   Summary of Call: pt percert number is [id number] for mri T and L Initial call taken by: Rudene Anda,  August 05, 2009 4:15 PM

## 2010-06-08 NOTE — Letter (Signed)
Summary: Discharge Summary  Discharge Summary   Imported By: Lind Guest 04/26/2010 14:29:17  _____________________________________________________________________  External Attachment:    Type:   Image     Comment:   External Document

## 2010-06-14 ENCOUNTER — Encounter: Payer: Self-pay | Admitting: Family Medicine

## 2010-06-14 ENCOUNTER — Ambulatory Visit (INDEPENDENT_AMBULATORY_CARE_PROVIDER_SITE_OTHER): Payer: Medicare Other | Admitting: Family Medicine

## 2010-06-14 DIAGNOSIS — R002 Palpitations: Secondary | ICD-10-CM

## 2010-06-14 DIAGNOSIS — G4700 Insomnia, unspecified: Secondary | ICD-10-CM | POA: Insufficient documentation

## 2010-06-14 DIAGNOSIS — F411 Generalized anxiety disorder: Secondary | ICD-10-CM

## 2010-06-14 DIAGNOSIS — I10 Essential (primary) hypertension: Secondary | ICD-10-CM

## 2010-06-22 NOTE — Assessment & Plan Note (Signed)
Summary: OFFICE VISIT   Vital Signs:  Patient profile:   77 year old female Menstrual status:  postmenopausal Height:      62 inches Weight:      107.25 pounds BMI:     19.69 O2 Sat:      98 % on Room air Pulse rate:   60 / minute Pulse rhythm:   regular Resp:     16 per minute BP sitting:   120 / 62  (left arm)  Vitals Entered By: Adella Hare LPN (June 14, 2010 2:53 PM)  O2 Flow:  Room air CC: follow-up visit Is Patient Diabetic? No   Primary Care Provider:  Dr. Syliva Overman  CC:  follow-up visit.  History of Present Illness: Reports  that she is  doing well. Denies recent fever or chills. Denies sinus pressure, nasal congestion , ear pain or sore throat. Denies chest congestion, or cough productive of sputum. Denies chest pain, palpitations, PND, orthopnea or leg swelling. Denies abdominal pain, nausea, vomitting, diarrhea or constipation. Denies change in bowel movements or bloody stool. Denies dysuria , frequency, incontinence or hesitancy. Denies  joint pain, swelling, or reduced mobility. Denies headaches, vertigo, seizures. Denies depression, anxiety or insomnia. Denies  rash, lesions, or itch.     Current Medications (verified): 1)  Xanax 0.5 Mg  Tabs (Alprazolam) .... Take 1 Tablet By Mouth Once Daily 2)  Oscal 500/200 D-3 500-200 Mg-Unit  Tabs (Calcium-Vitamin D) .... Take 1 Tablet By Mouth Three Times A Day 3)  Multivitamins   Tabs (Multiple Vitamin) .... Take 1 Tablet By Mouth Once A Day 4)  Restoril 30 Mg Caps (Temazepam) .... Take 1 Capsule By Mouth At Bedtime 5)  Aleve .... As Needed 6)  Pantoprazole Sodium 40 Mg Tbec (Pantoprazole Sodium) .... One Cap By Mouth Two Times A Day 7)  Amlodipine Besy-Benazepril Hcl 10-20 Mg Caps (Amlodipine Besy-Benazepril Hcl) .... Take 1 Capsule By Mouth Once A Day At 5pm 8)  Atenolol 25 Mg Tabs (Atenolol) .... Take 1 Tablet By Mouth Once A Day At 8am Ever Morning 9)  Tylenol Extra Strength 500 Mg Tabs  (Acetaminophen) .... Take 1 Tablet By Mouth Two Times A Day As Needed For Joint Pain  Allergies (verified): 1)  ! Sulfa 2)  ! Codeine 3)  ! Pcn  Review of Systems      See HPI General:  Complains of sleep disorder. Eyes:  Denies discharge and red eye. Psych:  Complains of anxiety. Endo:  Denies cold intolerance, excessive hunger, excessive thirst, excessive urination, and heat intolerance. Heme:  Denies abnormal bruising, bleeding, enlarge lymph nodes, and fevers. Allergy:  Denies hives or rash and itching eyes.  Physical Exam  General:  Well-developed,adequately l-nourished,in no acute distress; alert,appropriate and cooperative throughout examination HEENT: No facial asymmetry,  EOMI, No sinus tenderness, TM's Clear, oropharynx  pink and moist.   Chest: Clear to auscultation bilaterally.  CVS: S1, S2, No murmurs, No S3.   Abd: Soft, Nontender.  MS: Adequate ROM spine, hips,  and knees. decreased in left shoulder. Ext: No edema.   CNS: CN 2-12 intact, power tone and sensation normal throughout.   Skin: Intact, no visible lesions or rashes.  Psych: Good eye contact, normal affect.  Memory intact, not anxious or depressed appearing.    Impression & Recommendations:  Problem # 1:  INSOMNIA (ICD-780.52) Assessment Improved  Her updated medication list for this problem includes:    Restoril 30 Mg Caps (Temazepam) .Marland Kitchen... Take 1  capsule by mouth at bedtime  Orders: Medicare Electronic Prescription [phone])  Discussed sleep hygiene.   Problem # 2:  PULMONARY NODULE (ICD-518.89) Assessment: Comment Only has rept scan in March  Problem # 3:  PALPITATIONS (ICD-785.1) Assessment: Improved  Her updated medication list for this problem includes:    Atenolol 25 Mg Tabs (Atenolol) .Marland Kitchen... Take 1 tablet by mouth once a day at 8am ever morning recently saw cardiology  Problem # 4:  HYPERLIPIDEMIA (ICD-272.4) Assessment: Improved  Orders: T-Lipid Profile ([id number]) T-Hepatic  Function [phone]) Low fat dietdiscussed and encouraged  Labs Reviewed: SGOT: 28 (12/16/2009)   SGPT: 22 (12/16/2009)   HDL:70 (02/28/2010), 78 (12/16/2009)  LDL:109 (02/28/2010), 132 (12/16/2009)  Chol:193 (02/28/2010), 220 (12/16/2009)  Trig:72 (02/28/2010), 52 (12/16/2009)  Problem # 5:  HYPERTENSION (ICD-401.9) Assessment: Unchanged  Her updated medication list for this problem includes:    Amlodipine Besy-benazepril Hcl 10-20 Mg Caps (Amlodipine besy-benazepril hcl) .Marland Kitchen... Take 1 capsule by mouth once a day at 5pm    Atenolol 25 Mg Tabs (Atenolol) .Marland Kitchen... Take 1 tablet by mouth once a day at 8am ever morning  Orders: Medicare Electronic Prescription [phone]) T-Basic Metabolic Panel [phone])  BP today: 120/62 Prior BP: 118/69 (05/05/2010)  Labs Reviewed: K+: 4.2 (02/28/2010) Creat: : 0.98 (02/28/2010)   Chol: 193 (02/28/2010)   HDL: 70 (02/28/2010)   LDL: 109 (02/28/2010)   TG: 72 (02/28/2010)  Complete Medication List: 1)  Xanax 0.5 Mg Tabs (Alprazolam) .... Take 1 tablet by mouth once daily 2)  Oscal 500/200 D-3 500-200 Mg-unit Tabs (Calcium-vitamin d) .... Take 1 tablet by mouth three times a day 3)  Multivitamins Tabs (Multiple vitamin) .... Take 1 tablet by mouth once a day 4)  Restoril 30 Mg Caps (Temazepam) .... Take 1 capsule by mouth at bedtime 5)  Aleve  .... As needed 6)  Pantoprazole Sodium 40 Mg Tbec (Pantoprazole sodium) .... One cap by mouth two times a day 7)  Amlodipine Besy-benazepril Hcl 10-20 Mg Caps (Amlodipine besy-benazepril hcl) .... Take 1 capsule by mouth once a day at 5pm 8)  Atenolol 25 Mg Tabs (Atenolol) .... Take 1 tablet by mouth once a day at 8am ever morning 9)  Tylenol Extra Strength 500 Mg Tabs (Acetaminophen) .... Take 1 tablet by mouth two times a day as needed for joint pain  Patient Instructions: 1)  Please schedule a follow-up appointment in 3.5 months. 2)  No med chnges at this time you are doing well. 3)  BMP prior to visit,  ICD-9: 4)  Hepatic Panel prior to visit, ICD-9:  fasting in 3.5 months 5)  Lipid Panel prior to visit, ICD-9: 6)  Pls remeber to get your chest CT scan in March as scheduled Prescriptions: AMLODIPINE BESY-BENAZEPRIL HCL 10-20 MG CAPS (AMLODIPINE BESY-BENAZEPRIL HCL) Take 1 capsule by mouth once a day at 5pm  #30 x 5   Entered by:   Adella Hare LPN   Authorized by:   Syliva Overman MD   Signed by:   Adella Hare LPN on 02/72/5366   Method used:   Printed then faxed to ...       [facility]* (retail)       [address]       [place], [place]  [address]       Ph: [phone]       Fax: [phone]   RxID:   [id number] RESTORIL 30 MG CAPS (TEMAZEPAM) Take 1 capsule  by mouth at bedtime  #30 x 5   Entered by:   Adella Hare LPN   Authorized by:   Syliva Overman MD   Signed by:   Adella Hare LPN on 16/02/9603   Method used:   Printed then faxed to ...       [facility]* (retail)       [address]       [place], [place]  [address]       Ph: [phone]       Fax: [phone]   RxID:   [id number]    Orders Added: 1)  Est. Patient Level IV [10272] 2)  Medicare Electronic Prescription [G8553] 3)  T-Basic Metabolic Panel [[id number]] 4)  T-Lipid Profile [80061-22930] 5)  T-Hepatic Function [phone]

## 2010-07-07 ENCOUNTER — Telehealth (INDEPENDENT_AMBULATORY_CARE_PROVIDER_SITE_OTHER): Payer: Self-pay | Admitting: *Deleted

## 2010-07-13 NOTE — Progress Notes (Signed)
Summary: medicine  Phone Note Call from Patient   Summary of Call: needs her al[facility]razolm called [facility]  call back at [phone] to let her know Initial call taken by: Lind Guest,  July 07, 2010 8:27 AM  Follow-up for Phone Call        pls refilx 3l and let her know Follow-up by: Syliva Overman MD,  July 07, 2010 12:15 PM  Additional Follow-up for Phone Call Additional follow up Details #1::        faxed and patient aware Additional Follow-up by: Everitt Amber LPN,  July 07, 2010 1:06 PM    Prescriptions: [name] 0.5 MG  TABS (ALPRAZOLAM) Take 1 tablet by mouth once daily  #30 x 2   Entered by:   Everitt Amber LPN   Authorized by:   Syliva Overman MD   Signed by:   Everitt Amber LPN on 45/40/9811   Method used:   Printed then faxed to ...       [facility]* (retail)       [address]       [place], [place]  [address]       Ph: [phone]       Fax: [phone]   RxID:   [id number]

## 2010-07-18 LAB — DIFFERENTIAL
Basophils Absolute: 0 10*3/uL (ref 0.0–0.1)
Eosinophils Absolute: 0.2 10*3/uL (ref 0.0–0.7)
Eosinophils Relative: 1 % (ref 0–5)
Eosinophils Relative: 4 % (ref 0–5)
Lymphocytes Relative: 28 % (ref 12–46)
Lymphocytes Relative: 43 % (ref 12–46)
Lymphs Abs: 1.7 10*3/uL (ref 0.7–4.0)
Lymphs Abs: 2 10*3/uL (ref 0.7–4.0)
Lymphs Abs: 2.4 10*3/uL (ref 0.7–4.0)
Monocytes Absolute: 0.7 10*3/uL (ref 0.1–1.0)
Monocytes Relative: 10 % (ref 3–12)
Neutro Abs: 2.4 10*3/uL (ref 1.7–7.7)
Neutrophils Relative %: 43 % (ref 43–77)
Neutrophils Relative %: 48 % (ref 43–77)

## 2010-07-18 LAB — PROTIME-INR
INR: 0.94 (ref 0.00–1.49)
Prothrombin Time: 12.8 seconds (ref 11.6–15.2)

## 2010-07-18 LAB — URINALYSIS, ROUTINE W REFLEX MICROSCOPIC
Bilirubin Urine: NEGATIVE
Glucose, UA: NEGATIVE mg/dL
Ketones, ur: NEGATIVE mg/dL
Nitrite: NEGATIVE
Specific Gravity, Urine: 1.01 (ref 1.005–1.030)
pH: 7 (ref 5.0–8.0)

## 2010-07-18 LAB — CK TOTAL AND CKMB (NOT AT ARMC)
CK, MB: 1.3 ng/mL (ref 0.3–4.0)
Relative Index: INVALID (ref 0.0–2.5)
Total CK: 73 U/L (ref 7–177)
Total CK: 74 U/L (ref 7–177)

## 2010-07-18 LAB — URINE MICROSCOPIC-ADD ON

## 2010-07-18 LAB — CARDIAC PANEL(CRET KIN+CKTOT+MB+TROPI)
CK, MB: 1.8 ng/mL (ref 0.3–4.0)
CK, MB: 2.2 ng/mL (ref 0.3–4.0)
Relative Index: 2.4 (ref 0.0–2.5)
Total CK: 114 U/L (ref 7–177)
Troponin I: 0.14 ng/mL — ABNORMAL HIGH (ref 0.00–0.06)

## 2010-07-18 LAB — COMPREHENSIVE METABOLIC PANEL
ALT: 13 U/L (ref 0–35)
AST: 21 U/L (ref 0–37)
Calcium: 9.4 mg/dL (ref 8.4–10.5)
GFR calc Af Amer: 52 mL/min — ABNORMAL LOW (ref >60)
Glucose, Bld: 81 mg/dL (ref 70–99)
Sodium: 141 mEq/L (ref 135–145)
Total Protein: 6.2 g/dL (ref 6.0–8.3)

## 2010-07-18 LAB — BASIC METABOLIC PANEL
BUN: 11 mg/dL (ref 6–23)
BUN: 7 mg/dL (ref 6–23)
CO2: 28 mEq/L (ref 19–32)
Calcium: 9.8 mg/dL (ref 8.4–10.5)
Chloride: 104 mEq/L (ref 96–112)
Creatinine, Ser: 1 mg/dL (ref 0.4–1.2)
GFR calc Af Amer: >60 mL/min (ref >60)
GFR calc non Af Amer: 54 mL/min — ABNORMAL LOW (ref >60)
Potassium: 4.3 mEq/L (ref 3.5–5.1)

## 2010-07-18 LAB — POCT CARDIAC MARKERS: Troponin i, poc: <0.05 ng/mL (ref 0.00–0.09)

## 2010-07-18 LAB — HEPATIC FUNCTION PANEL
Albumin: 3.9 g/dL (ref 3.5–5.2)
Total Bilirubin: 0.9 mg/dL (ref 0.3–1.2)
Total Protein: 7.4 g/dL (ref 6.0–8.3)

## 2010-07-18 LAB — CBC
HCT: 39.1 % (ref 36.0–46.0)
MCHC: 34.9 g/dL (ref 30.0–36.0)
MCV: 90.3 fL (ref 78.0–100.0)
Platelets: 245 10*3/uL (ref 150–400)
RBC: 4.33 MIL/uL (ref 3.87–5.11)
RBC: 4.43 MIL/uL (ref 3.87–5.11)
RDW: 12.9 % (ref 11.5–15.5)
RDW: 13.3 % (ref 11.5–15.5)
WBC: 5.6 10*3/uL (ref 4.0–10.5)
WBC: 6.1 10*3/uL (ref 4.0–10.5)

## 2010-07-18 LAB — TROPONIN I: Troponin I: 0.03 ng/mL (ref 0.00–0.06)

## 2010-07-18 LAB — URINE CULTURE
Colony Count: NO GROWTH
Culture  Setup Time: 201111272202

## 2010-07-18 LAB — MRSA PCR SCREENING: MRSA by PCR: NEGATIVE

## 2010-07-23 LAB — COMPREHENSIVE METABOLIC PANEL
ALT: 26 U/L (ref 0–35)
AST: 35 U/L (ref 0–37)
Alkaline Phosphatase: 73 U/L (ref 39–117)
CO2: 29 mEq/L (ref 19–32)
Chloride: 98 mEq/L (ref 96–112)
Creatinine, Ser: 1.09 mg/dL (ref 0.4–1.2)
GFR calc Af Amer: 60 mL/min — ABNORMAL LOW (ref >60)
GFR calc non Af Amer: 49 mL/min — ABNORMAL LOW (ref >60)
Potassium: 3.6 mEq/L (ref 3.5–5.1)
Sodium: 135 mEq/L (ref 135–145)
Total Bilirubin: 1.3 mg/dL — ABNORMAL HIGH (ref 0.3–1.2)

## 2010-07-23 LAB — LIPASE, BLOOD: Lipase: 40 U/L (ref 11–59)

## 2010-07-23 LAB — CBC
MCV: 96 fL (ref 78.0–100.0)
RBC: 4.2 MIL/uL (ref 3.87–5.11)
WBC: 6.1 10*3/uL (ref 4.0–10.5)

## 2010-07-23 LAB — DIFFERENTIAL
Basophils Absolute: 0 10*3/uL (ref 0.0–0.1)
Eosinophils Absolute: 0.3 10*3/uL (ref 0.0–0.7)
Eosinophils Relative: 4 % (ref 0–5)

## 2010-07-25 LAB — CBC
HCT: 38 % (ref 36.0–46.0)
MCV: 95.2 fL (ref 78.0–100.0)
Platelets: 189 10*3/uL (ref 150–400)
RDW: 14.4 % (ref 11.5–15.5)
WBC: 9.1 10*3/uL (ref 4.0–10.5)

## 2010-07-25 LAB — URINALYSIS, ROUTINE W REFLEX MICROSCOPIC
Bilirubin Urine: NEGATIVE
Ketones, ur: NEGATIVE mg/dL
Nitrite: NEGATIVE
Urobilinogen, UA: 0.2 mg/dL (ref 0.0–1.0)

## 2010-07-25 LAB — DIFFERENTIAL
Basophils Absolute: 0 10*3/uL (ref 0.0–0.1)
Eosinophils Absolute: 0.1 10*3/uL (ref 0.0–0.7)
Eosinophils Relative: 1 % (ref 0–5)
Lymphs Abs: 0.6 10*3/uL — ABNORMAL LOW (ref 0.7–4.0)
Neutrophils Relative %: 85 % — ABNORMAL HIGH (ref 43–77)

## 2010-07-25 LAB — BASIC METABOLIC PANEL
BUN: 14 mg/dL (ref 6–23)
Chloride: 93 mEq/L — ABNORMAL LOW (ref 96–112)
Creatinine, Ser: 0.97 mg/dL (ref 0.4–1.2)
GFR calc non Af Amer: 56 mL/min — ABNORMAL LOW (ref >60)
Glucose, Bld: 120 mg/dL — ABNORMAL HIGH (ref 70–99)
Potassium: 3.2 mEq/L — ABNORMAL LOW (ref 3.5–5.1)

## 2010-07-25 LAB — URINE CULTURE

## 2010-07-25 LAB — URINE MICROSCOPIC-ADD ON

## 2010-07-27 ENCOUNTER — Ambulatory Visit (HOSPITAL_COMMUNITY): Payer: Medicare Other | Attending: Family Medicine

## 2010-07-31 ENCOUNTER — Other Ambulatory Visit: Payer: Self-pay | Admitting: Family Medicine

## 2010-08-01 ENCOUNTER — Other Ambulatory Visit: Payer: Self-pay | Admitting: Family Medicine

## 2010-08-01 LAB — HEPATIC FUNCTION PANEL
ALT: 13 U/L (ref 0–35)
Total Protein: 7.4 g/dL (ref 6.0–8.3)

## 2010-08-01 LAB — BASIC METABOLIC PANEL
BUN: 13 mg/dL (ref 6–23)
CO2: 25 mEq/L (ref 19–32)
Calcium: 9.5 mg/dL (ref 8.4–10.5)
Chloride: 103 mEq/L (ref 96–112)
Creat: 1.17 mg/dL (ref 0.40–1.20)
Glucose, Bld: 100 mg/dL — ABNORMAL HIGH (ref 70–99)

## 2010-08-01 LAB — LIPID PANEL
Total CHOL/HDL Ratio: 3 Ratio
VLDL: 15 mg/dL (ref 0–40)

## 2010-08-13 LAB — DIFFERENTIAL
Basophils Relative: 0 % (ref 0–1)
Lymphs Abs: 2.1 10*3/uL (ref 0.7–4.0)
Monocytes Relative: 12 % (ref 3–12)
Neutro Abs: 3.2 10*3/uL (ref 1.7–7.7)
Neutrophils Relative %: 51 % (ref 43–77)

## 2010-08-13 LAB — HEPATIC FUNCTION PANEL
AST: 65 U/L — ABNORMAL HIGH (ref 0–37)
Albumin: 4 g/dL (ref 3.5–5.2)
Total Bilirubin: 0.9 mg/dL (ref 0.3–1.2)
Total Protein: 7 g/dL (ref 6.0–8.3)

## 2010-08-13 LAB — TROPONIN I: Troponin I: 0.03 ng/mL (ref 0.00–0.06)

## 2010-08-13 LAB — CK TOTAL AND CKMB (NOT AT ARMC)
Relative Index: 1.1 (ref 0.0–2.5)
Total CK: 150 U/L (ref 7–177)

## 2010-08-13 LAB — CBC
MCHC: 35.5 g/dL (ref 30.0–36.0)
RBC: 4.07 MIL/uL (ref 3.87–5.11)
WBC: 6.2 10*3/uL (ref 4.0–10.5)

## 2010-08-16 LAB — COMPREHENSIVE METABOLIC PANEL
ALT: 37 U/L — ABNORMAL HIGH (ref 0–35)
AST: 48 U/L — ABNORMAL HIGH (ref 0–37)
Albumin: 3.1 g/dL — ABNORMAL LOW (ref 3.5–5.2)
Albumin: 3.9 g/dL (ref 3.5–5.2)
BUN: 5 mg/dL — ABNORMAL LOW (ref 6–23)
CO2: 28 mEq/L (ref 19–32)
Calcium: 9 mg/dL (ref 8.4–10.5)
Chloride: 78 mEq/L — CL (ref 96–112)
Creatinine, Ser: 0.98 mg/dL (ref 0.4–1.2)
GFR calc Af Amer: >60 mL/min (ref >60)
GFR calc non Af Amer: >60 mL/min (ref >60)
Glucose, Bld: 91 mg/dL (ref 70–99)
Potassium: 2.9 mEq/L — ABNORMAL LOW (ref 3.5–5.1)
Sodium: 119 mEq/L — CL (ref 135–145)
Total Bilirubin: 1.8 mg/dL — ABNORMAL HIGH (ref 0.3–1.2)
Total Protein: 5.5 g/dL — ABNORMAL LOW (ref 6.0–8.3)

## 2010-08-16 LAB — BASIC METABOLIC PANEL
BUN: 5 mg/dL — ABNORMAL LOW (ref 6–23)
Chloride: 96 mEq/L (ref 96–112)
Creatinine, Ser: 0.71 mg/dL (ref 0.4–1.2)
Glucose, Bld: 113 mg/dL — ABNORMAL HIGH (ref 70–99)
Potassium: 3.3 mEq/L — ABNORMAL LOW (ref 3.5–5.1)

## 2010-08-16 LAB — URINE CULTURE: Colony Count: 25000

## 2010-08-16 LAB — DIFFERENTIAL
Basophils Absolute: 0 10*3/uL (ref 0.0–0.1)
Basophils Relative: 0 % (ref 0–1)
Eosinophils Absolute: 0 10*3/uL (ref 0.0–0.7)
Eosinophils Relative: 0 % (ref 0–5)
Lymphocytes Relative: 23 % (ref 12–46)
Lymphocytes Relative: 7 % — ABNORMAL LOW (ref 12–46)
Lymphs Abs: 1.4 10*3/uL (ref 0.7–4.0)
Monocytes Absolute: 0.1 10*3/uL (ref 0.1–1.0)
Monocytes Absolute: 0.9 10*3/uL (ref 0.1–1.0)
Monocytes Relative: 15 % — ABNORMAL HIGH (ref 3–12)
Neutro Abs: 3.7 10*3/uL (ref 1.7–7.7)
Neutrophils Relative %: 62 % (ref 43–77)

## 2010-08-16 LAB — URINALYSIS, ROUTINE W REFLEX MICROSCOPIC
Leukocytes, UA: NEGATIVE
Nitrite: NEGATIVE
Protein, ur: NEGATIVE mg/dL
Urobilinogen, UA: 0.2 mg/dL (ref 0.0–1.0)
pH: 6.5 (ref 5.0–8.0)

## 2010-08-16 LAB — LIPASE, BLOOD: Lipase: 20 U/L (ref 11–59)

## 2010-08-16 LAB — CBC
HCT: 33.9 % — ABNORMAL LOW (ref 36.0–46.0)
Hemoglobin: 11.9 g/dL — ABNORMAL LOW (ref 12.0–15.0)
MCHC: 35 g/dL (ref 30.0–36.0)
Platelets: 207 10*3/uL (ref 150–400)
Platelets: 228 10*3/uL (ref 150–400)
RBC: 4.14 MIL/uL (ref 3.87–5.11)
RDW: 13.8 % (ref 11.5–15.5)
WBC: 6.7 10*3/uL (ref 4.0–10.5)

## 2010-08-16 LAB — MAGNESIUM: Magnesium: 1.7 mg/dL (ref 1.5–2.5)

## 2010-08-16 LAB — URINE MICROSCOPIC-ADD ON

## 2010-08-17 LAB — BASIC METABOLIC PANEL
CO2: 32 mEq/L (ref 19–32)
Chloride: 95 mEq/L — ABNORMAL LOW (ref 96–112)
GFR calc non Af Amer: >60 mL/min (ref >60)
Glucose, Bld: 93 mg/dL (ref 70–99)
Potassium: 3.9 mEq/L (ref 3.5–5.1)
Sodium: 132 mEq/L — ABNORMAL LOW (ref 135–145)

## 2010-08-17 LAB — CBC
HCT: 36.8 % (ref 36.0–46.0)
Hemoglobin: 12.8 g/dL (ref 12.0–15.0)
MCV: 96 fL (ref 78.0–100.0)
RDW: 13.5 % (ref 11.5–15.5)

## 2010-08-22 LAB — CBC
HCT: 37.2 % (ref 36.0–46.0)
MCV: 95.4 fL (ref 78.0–100.0)
RBC: 3.9 MIL/uL (ref 3.87–5.11)
WBC: 5.8 10*3/uL (ref 4.0–10.5)

## 2010-08-22 LAB — URINALYSIS, ROUTINE W REFLEX MICROSCOPIC
Bilirubin Urine: NEGATIVE
Glucose, UA: NEGATIVE mg/dL
Ketones, ur: NEGATIVE mg/dL
Protein, ur: NEGATIVE mg/dL

## 2010-08-22 LAB — DIFFERENTIAL
Eosinophils Absolute: 0.3 10*3/uL (ref 0.0–0.7)
Eosinophils Relative: 6 % — ABNORMAL HIGH (ref 0–5)
Lymphs Abs: 2.2 10*3/uL (ref 0.7–4.0)
Monocytes Relative: 10 % (ref 3–12)

## 2010-08-22 LAB — BASIC METABOLIC PANEL
BUN: 15 mg/dL (ref 6–23)
CO2: 31 mEq/L (ref 19–32)
Chloride: 97 mEq/L (ref 96–112)
GFR calc Af Amer: >60 mL/min (ref >60)
Potassium: 3 mEq/L — ABNORMAL LOW (ref 3.5–5.1)

## 2010-08-28 ENCOUNTER — Telehealth: Payer: Self-pay | Admitting: Family Medicine

## 2010-08-28 ENCOUNTER — Other Ambulatory Visit (INDEPENDENT_AMBULATORY_CARE_PROVIDER_SITE_OTHER): Payer: Medicare Other

## 2010-08-28 ENCOUNTER — Other Ambulatory Visit: Payer: Self-pay | Admitting: *Deleted

## 2010-08-28 DIAGNOSIS — I10 Essential (primary) hypertension: Secondary | ICD-10-CM

## 2010-08-28 MED ORDER — ATENOLOL 25 MG PO TABS: 25.0000 mg | ORAL_TABLET | Freq: Every day | ORAL | Status: DC

## 2010-08-28 NOTE — Progress Notes (Signed)
Opened in error

## 2010-08-28 NOTE — Telephone Encounter (Signed)
pls call in early refill due to lost medication , explain to pt dangerous to lose med like this, needs to be more careful

## 2010-09-18 ENCOUNTER — Other Ambulatory Visit: Payer: Self-pay | Admitting: Family Medicine

## 2010-09-19 NOTE — Assessment & Plan Note (Signed)
[facility]                          [facility] NOTE   [name], [name]                     MRN:          [id number]  DATE:07/15/2008                            DOB:          01-20-1934    PRIMARY CARE PHYSICIAN:  Milus Mallick. Lodema Hong, MD   REASON FOR VISIT:  Followup palpitations.   HISTORY OF PRESENT ILLNESS:  I saw [name] back in December in our  [place] office.  She has a history of intermittent palpitations with  normal coronary arteries documented at catheterization in 2003.  She had  been experiencing more symptomatic palpitations, and when we last saw  her, a followup echocardiogram was arranged to ensure that her left  ventricular systolic function had not changed.  This study was done on  May 10, 2008, and demonstrated normal left ventricular systolic  function with no regional wall motion abnormalities and no major  valvular abnormalities.  We did ask that she try to take an extra 12.5  mg of atenolol if she had palpitations in the evening which was her  typical pattern and this did seem to help.  She is due to have bladder  surgery with Dr. Rito Ehrlich next week on an outpatient basis.   ALLERGIES:  SULFA drugs and CODEINE.   MEDICATIONS:  1. Aspirin 81 mg p.o. daily.  2. Xanax 0.5 mg p.o. b.i.d.  3. Atenolol 25 mg p.o. daily.  4. Lotrel 10/20 mg p.o. daily.  5. Hydrochlorothiazide 25 mg p.o. daily.  6. Loratadine 10 mg p.o. daily.  7. Flonase nasal spray 2 puffs daily.  8. Os-Cal with vitamin D b.i.d.  9. Multivitamin daily.  10.Nexium 40 mg p.o. daily.  11.Simvastatin 40 mg p.o. at bedtime.  12.Tylenol.  13.Restoril.  14.Antivert p.r.n.   REVIEW OF SYSTEMS:  As outlined above.  Otherwise negative.   PHYSICAL EXAMINATION:  VITAL SIGNS:  Blood pressure is 145/67, heart  rate 62 and regular, weight is 102 pounds.  GENERAL:  The patient is comfortable in no acute distress.  HEENT:  Conjunctiva is  normal.  Oropharynx is clear.  NECK:  Supple.  No elevated jugular venous pressure.  No loud bruits.  No thyromegaly.  LUNGS:  Clear without labored breathing at rest.  CARDIAC:  Regular rate and rhythm.  No loud murmur or gallop.  ABDOMEN:  Soft, nontender.  Active bowel sounds.  EXTREMITIES:  No significant pitting edema.  Distal pulses are 2+.  SKIN:  Warm and dry.  SKIN:  Warm and dry.  MUSCULOSKELETAL:  No kyphosis noted.  NEUROPSYCHIATRIC:  The patient is alert and oriented x3.  Affect is  appropriate.   IMPRESSION AND RECOMMENDATIONS:  Intermittent symptomatic palpitations,  typically in the evening.  This was responsive to using an extra 12.5 mg  of atenolol and her left ventricular systolic function in followup  remains normal.  She does have a resting heart rate typically in the low  60s, and following her bladder surgery, I have asked her to increase her  atenolol to 25 mg in the morning and 12.5 mg in the evening with heart  rate checks thereafter.  If she does not become symptomatically  bradycardic and remains with good symptom control of her palpitations,  we will continue that regimen.  Otherwise, we may need to consider  switching her to a different medication such as pindolol.  I will bring  back over the next 6 weeks to see how she is doing.     Jonelle Sidle, MD  Electronically Signed    SGM/MedQ  DD: 07/15/2008  DT: 07/16/2008  Job #: [id number]   cc:   Milus Mallick. Lodema Hong, M.D.

## 2010-09-19 NOTE — Op Note (Signed)
[name], [name]              ACCOUNT NO.:  [id number]   MEDICAL RECORD NO.:  [id number]          PATIENT TYPE:  INP   LOCATION:  A329                          FACILITY:  [facility]   PHYSICIAN:  Dennie Maizes, M.D.   DATE OF BIRTH:  [birth year]   DATE OF PROCEDURE:  12/22/2007  DATE OF DISCHARGE:                               OPERATIVE REPORT   PREOPERATIVE DIAGNOSES:  1. Hematuria.  2. Bladder tumor.   POSTOPERATIVE DIAGNOSES:  1. Hematuria.  2. Bladder tumor.   OPERATIVE:  Cystoscopy, multiple bladder biopsies, and transurethral  resection of bladder tumor (3.5 cm in size).   ANESTHESIA:  Spinal.   SURGEON:  Dennie Maizes, MD.   COMPLICATIONS:  None.   ESTIMATED BLOOD LOSS:  Minimal.   DRAINS:  A 22-French triple-lumen Foley catheter with 30 mL balloon in  the bladder.   SPECIMEN:  Bladder biopsies x3 and bladder tumor x1.   INDICATIONS FOR THE PROCEDURE:  This 77 year old female was evaluated  for gross hematuria.  X-rays and cystoscopy revealed a large papillary  bladder tumor in the left lateral wall of the bladder.  The patient was  taken to operating room today for cystoscopy, multiple bladder biopsies,  and transurethral resection of the large papillary bladder tumor.   DESCRIPTION OF PROCEDURE:  Spinal anesthesia was induced, and the  patient was placed on the OR table in the dorsal lithotomy position.  The lower abdomen and genitalia were prepped and draped in a sterile  fashion.  Cystoscopy was done with a 25-French scope.  The trigone and  ureteral orifices were normal.  There is a large papillary bladder tumor  about 3.5 cm in size in the left lateral wall just below the ureteral  orifice.  The rest of the bladder mucosa was normal.   With the rigid biopsy forceps, mucosal biopsies were done from the right  lateral wall, posterior wall, and left lateral wall of the bladder.  The  cystoscope was removed.   Urethra was dilated with straight metal sounds  up to 30-French.  A 28-  Jamaica Iglesias resectoscope with continuous bladder irrigation was then  inserted into the bladder.  The biopsy sites were first examined and  fulgurated.  The papillary tumor was then dissected up to the muscle  base.  Muscle base was then thoroughly fulgurated.  There was no active  bleeding at this time.  The tumor pieces were removed and sent for  histopathological examination.  The tumor size was about 3.5 cm.  The  instruments were removed.  A 22-French triple-lumen Foley catheter with  30 mL balloon was then inserted into the bladder and continuous bladder  irrigation was started.  The returns were clear.  Estimated blood loss  was minimal.  The patient was transferred to the PACU in a satisfactory  condition.      Dennie Maizes, M.D.  Electronically Signed     SK/MEDQ  D:  12/22/2007  T:  12/23/2007  Job:  [id number]   cc:   Dr. Lodema Hong

## 2010-09-19 NOTE — H&P (Signed)
[name], [name]              ACCOUNT NO.:  [id number]   MEDICAL RECORD NO.:  [id number]          PATIENT TYPE:  AMB   LOCATION:  DAY                           FACILITY:  [facility]   PHYSICIAN:  Dennie Maizes, M.D.   DATE OF BIRTH:  Sep 28, 1933   DATE OF ADMISSION:  12/22/2007  DATE OF DISCHARGE:  [facility]                              HISTORY & PHYSICAL   CHIEF COMPLAINT:  Hematuria, bladder tumor.   HISTORY OF PRESENT ILLNESS:  A 77 year old female was referred to me by  Dr. Lodema Hong.  She has noticed intermittent gross hematuria for several  weeks.  Also passed some blood clots in the urine.  She denied having  voiding difficulty, dysuria, abdominal pain or flank pain.  She has  urinary frequency x3, nocturia x3.  There is no history of urinary  leakage.   PAST MEDICAL HISTORY:  Negative for GU problems.  She has palpitations,  hypertension and elevated cholesterol.   MEDICATIONS:  1. Atenolol 25 mg 14 one p.o. daily.  2. Hydrochlorothiazide 25 mg p.o. daily.  3. Lotrel 10/20 one mg p.o. daily.  4. Simvastatin 14 mg one p.o. daily.  5. Nexium 40 mg one p.o. daily.  6. Flonase nasal spray.  7. Alprazolam 0.5 mg p.o. p.r.n.  8. Temazepam 15 mg p.o. q.h.s.  9. Tylenol.  10.Calcium with vitamin D.  11.Multivitamins.  12.B12.  13.Folic acid.   ALLERGIES:  SULFA AND CODEINE.   FAMILY HISTORY:  Positive for diabetes mellitus and heart disease,  carcinoma, unknown site.   PHYSICAL EXAMINATION:  VITAL SIGNS:  Height 5 feet 2 inches, 118 pounds.  HEENT: Normal.  NECK:  No masses.  LUNGS:  Clear to auscultation.  HEART:  Regular rate and rhythm.  No murmurs.  ABDOMEN:  Soft.  No palpable flank mass or CVA tenderness.  Bladder is  not palpable.  PELVIC:  Examination negative.   STUDIES:  Renal ultrasound done at [facility] revealed right  renal length 8.5 cm, left renal and 8.8 cm.  There are no solid renal  masses, hydronephrosis or calcifications.  The mass in the  bladder base  measuring 2.3 x 2.1 x 1.6 cm in size.  Urine cytology revealed atypical  urothelial cells.  CT scan of abdomen, pelvis with and without contrast  revealed a left sided bladder mass about 2.5 cm in size.   IMPRESSION:  Hematuria, bladder mass.   PLAN:  I discussed with the patient regarding the management options.  She is scheduled to undergo cystoscopy, multiple bladder biopsies and  transurethral  resection of bladder mass at [name].  I  explained to the patient regarding the diagnosis, operative details of  the treatment outcome, possible risks and complications and she has  agreed for the procedure to be done.      Dennie Maizes, M.D.  Electronically Signed     SK/MEDQ  D:  12/22/2007  T:  12/22/2007  Job:  [id number]   cc:   Milus Mallick. Lodema Hong, M.D.  Fax: [phone]   [facility]

## 2010-09-19 NOTE — Assessment & Plan Note (Signed)
[facility]                       [place] [facility] OFFICE NOTE   [name], [name]                     MRN:          [id number]  DATE:04/09/2008                            DOB:          Jul 20, 1933    CARDIOLOGIST:  Jonelle Sidle, MD   PRIMARY CARE PHYSICIAN:  Milus Mallick. Lodema Hong, MD   REASON FOR VISIT:  Palpitations.   HISTORY OF PRESENT ILLNESS:  [name] is a 77 year old female  patient who has followed with Dr. Diona Browner in our [facility] for the  last several years with a history of palpitations and normal coronary  arteries by cardiac catheterization in 2003.  When she was last seen in  November 2008, she had her atenolol dose decreased secondary to  bradycardia.  However, the patient saw her primary care doctor in  followup sometime after that secondary to palpitations and she was asked  to go back on 25 mg of atenolol a day.  She was also apparently set up  with an event monitor.  The results of this are unknown.  She had this  done with Dr. Mirna Mires in [place].  She was subsequently diagnosed  with bladder cancer and underwent resection in August 2009.  Around that  time she began to have palpitations again and these have continued since  that time.  She denies any syncope or associated near syncope.  She  thinks that her palpitations are probably fast.  She denies chest pain.  She denies any significant shortness of breath.  She denies orthopnea or  PND.  She denies pedal edema.  She denies any excessive caffeine use or  over-the-counter cold medicine use.  She is now referred by Dr. Lodema Hong  for further evaluation.   CURRENT MEDICATIONS:  Aspirin daily, Lotrel 10/20 mg nightly, Atenolol  25 mg daily, HCTZ 25 mg daily, Simvastatin 40 mg daily, Multivitamin  daily,  Kapidex 60 mg daily, Sucralfate 1 g daily, Nitroglycerin p.r.n. chest  pain, Flonase, Loratadine, Xanax, Temazepam, Tylenol,  B12, Folate.   ALLERGIES:  CODEINE and SULFA   SOCIAL HISTORY:  She denies tobacco abuse.   REVIEW OF SYSTEMS:  Please see HPI.  She denies any fevers, chills,  cough, melena, hematochezia, hematuria, dysuria.  Rest of the review of  systems are negative.   PHYSICAL EXAMINATION:  GENERAL:  She is a well-nourished, well-developed  female in no acute distress.  VITAL SIGNS:  Blood pressure is 120/60, pulse 68, weight 113 pounds.  HEENT:  Normal neck without JVD.  CARDIAC:  Normal S1 and S2.  Regular rate and rhythm.  No significant  murmurs.  LUNGS:  Clear to auscultation bilaterally.  ABDOMEN:  Soft and nontender.  EXTREMITIES:  Without edema.  NEUROLOGIC:  She is alert and oriented x3.  Cranial nerves II through  XII grossly intact.  SKIN:  Warm and dry.   EKG, sinus bradycardia with heart rate of 55, normal axis, no acute  changes.   ASSESSMENT AND PLAN:  Palpitations.  [name] has had a long history  of palpitations previously.  She has also had a significant workup for  this in the past.  These have been previously controlled on atenolol.  She has had worsening of her symptoms since she was diagnosed with  bladder cancer this past summer.  Her symptoms seem to be more related  to stress/anxiety than anything else.  She has no other associated  symptoms with these.  She had lab work performed yesterday with her  primary care physician.  We will obtain those results.  We will also  contact Dr. Loleta Chance in [place] and try to obtain the results of her  event monitor done last year.  We will obtain a 2-D echocardiogram to  assess her LV structure and function and valvular status.  I have  explained to her that if she is having significantly increased symptoms,  she can certainly take an extra 12.5 mg of atenolol as needed.  I do not  think she would be able to tolerate a much higher dose of atenolol on a  daily basis secondary to her underlying bradycardia.  I discussed her  case with Dr.  Diona Browner who agreed.   DISPOSITION:  She will be brought back in followup in the next 1-2  months or sooner p.r.n.      Tereso Newcomer, PA-C  Electronically Signed      Jonelle Sidle, MD  Electronically Signed   SW/MedQ  DD: 04/09/2008  DT: 04/10/2008  Job #: [id number]   cc:   Milus Mallick. Lodema Hong, M.D.

## 2010-09-19 NOTE — Assessment & Plan Note (Signed)
[facility]                          [facility] NOTE   ANAPAOLA, KINSEL                     MRN:          [id number]  DATE:12/10/2008                            DOB:          Jan 24, 1934    PRIMARY CARE PHYSICIAN:  Milus Mallick. Lodema Hong, MD   REASON FOR VISIT:  Followup palpitations.   HISTORY OF PRESENT ILLNESS:  [name] is doing fairly well since her  last visit in April, denying any problems with palpitations since we  adjusted her dose of atenolol.  She is not reporting any chest pain or  breathlessness at this time.  Her main complaint is of progressive  reflux symptoms despite using both Nexium and Zantac.  She has had no  dizziness or syncope.   ALLERGIES:  SULFA DRUGS and CODEINE.   PRESENT MEDICATIONS:  1. Aspirin 81 mg p.o. daily.  2. Xanax 0.5 mg p.o. b.i.d.  3. Atenolol 25 mg p.o. q.a.m. and 12.5 mg p.o. q.p.m.  4. Lotrel 10/20 mg p.o. daily.  5. Hydrochlorothiazide 25 mg p.o. daily.  6. Loratadine 10 mg p.o. daily.  7. Os-Cal.  8. Vitamin D 1 p.o. b.i.d.  9. Multivitamin once daily.  10.Nexium 40 mg p.o. daily.  11.Zantac 150 mg p.o. daily.  12.Restoril 30 mg p.o. nightly p.r.n.  13.Tylenol Extra Strength p.r.n.  14.Flonase nasal spray p.r.n.   REVIEW OF SYSTEMS:  As outlined above.  Otherwise, reviewed negative.   PHYSICAL EXAMINATION:  VITAL SIGNS:  Blood pressure 116/65, heart rate  is 58 and regular, and weight is 106 pounds, up from 103.  GENERAL:  The patient is in no acute distress.  HEENT:  Conjunctivae is normal.  Oropharynx clear.  NECK:  Supple.  No elevated jugular venous pressure, loud bruits, or  thyromegaly.  LUNGS:  Clear to P and A.  CARDIAC:  Regular rate and rhythm.  No murmur, rub, or gallop.  EXTREMITIES:  No pitting edema.   IMPRESSION AND RECOMMENDATIONS:  History of palpitations with previously  documented premature atrial complexes, presently well controlled on  present dose  of atenolol.  We will make  no other specific changes at this time.  She will continue regular  primary care followup with Dr. Lodema Hong for management of hypertension  and reflux symptoms.     Jonelle Sidle, MD  Electronically Signed    SGM/MedQ  DD: 12/10/2008  DT: 12/11/2008  Job #: [id number]   cc:   Milus Mallick. Lodema Hong, M.D.

## 2010-09-19 NOTE — H&P (Signed)
[name]              ACCOUNT NO.:  [id number]   MEDICAL RECORD NO.:  [id number]          PATIENT TYPE:  INP   LOCATION:  A330                          FACILITY:  [facility]   PHYSICIAN:  Osvaldo Shipper, MD     DATE OF BIRTH:  Apr 07, 1934   DATE OF ADMISSION:  08/18/2008  DATE OF DISCHARGE:  [facility]                              HISTORY & PHYSICAL   The patient's primary medical doctor is Milus Mallick. [name], M.D.  Urologist, Dennie Maizes, M.D.   ADMISSION DIAGNOSES:  1. Severe hyponatremia.  2. Severe hypokalemia.  3. Likely gastroenteritis.  4. History of bladder cancer.  5. History of hypertension.   CHIEF COMPLAINT:  Nausea, vomiting since last night.   HISTORY OF PRESENT ILLNESS:  The patient is a 77 year old African  American female who presents to the hospital this morning with  complaints of nausea, vomiting and some palpitations.  Apparently she  underwent treatment for bladder cancer yesterday in her urologist's  office.  This involved BCG therapy.  This BCG was put in the bladder and  was washed out.  She went home and then started feeling nauseous and  then overnight started throwing up.  She mentioned that she has thrown  up multiple times.  She denies any abdominal pain.  She has been having  diarrhea as well overnight.  This was loose stool.  She denies any blood  in the stool.  Denies any fever or chills.  She has been feeling weak,  shaky, nervous, confused all night long.  This morning she decided to  come into the hospital. She denies any dysuria or hematuria.   MEDICATIONS AT HOME:  1. Aspirin 81 mg daily.  2. Xanax 0.5 mg b.i.d.  3. Atenolol 25 mg daily.  4. Lotrel 10/20 daily.  5. Hydrochlorothiazide 25 mg daily.  6. Loratadine 10 mg daily.  7. Fluticasone spray twice daily.  8. Os-Cal Plus D daily.  9. Multivitamin daily.  10.Tylenol Extra Strength as needed.  11.Restoril 30 mg at bedtime.  12.Antivert 12.5 mg 3 times a day as needed.  13.Nexium 40 mg daily.  14.Simvastatin 40 mg daily.   ALLERGIES:  SULFA and CODEINE.   PAST MEDICAL HISTORY:  1. Acid reflux disease.  2. Heart murmur.  3. Hypertension.  4. Dyslipidemia.  5. Bladder cancer diagnosed in 2009.  She has had cystoscopy.  6. She has had eye surgeries, cholecystectomy and tubal ligation.   SOCIAL HISTORY:  Lives alone in [place].  No smoking, alcohol or illicit  drug use.  Independent with her daily activities.   FAMILY HISTORY:  Positive for diabetes and heart disease.   REVIEW OF SYSTEMS:  GENERAL:  This is positive for weakness, malaise.  HEENT:  Unremarkable.  CARDIOVASCULAR:  Unremarkable.  RESPIRATORY:  Unremarkable.  GI:  As in HPI.  GU:  As in HPI.  NEUROLOGIC:  Unremarkable.  PSYCHIATRIC:  Unremarkable.  DERMATOLOGIC:  Unremarkable.  Other systems unremarkable.   PHYSICAL EXAMINATION:  VITAL SIGNS:  Temperature was 97.6, heart rate  73, respiratory rate 16, blood pressure 144/62, saturation 100% on 2 L.  GENERAL EXAM:  This is a thin, elderly African American female in no  distress.  HEENT:  No pallor, no icterus.  Oral mucous membrane is moist.  No oral  lesions are noted.  NECK:  Soft and supple.  No thyromegaly is appreciated.  LYMPHATIC SYSTEM:  No cervical lymphadenopathy was noted.  LUNGS:  Clear to auscultation bilaterally.  No wheezing, rales.  CARDIOVASCULAR:  S1, S2 is normal, regular.  No S3, S4.  There is a  systolic murmur appreciated.  No rubs.  No bruits.  ABDOMEN:  Soft, bowel sounds are present.  Tenderness in the suprapubic  area is noted.  No rebound, rigidity or guarding.  No masses are  present.  No organomegaly is appreciated.  MUSCULOSKELETAL:  Unremarkable.  NEUROLOGIC:  She is alert and oriented x3.  No focal neurologic deficits  are present.  Examination of the back did not reveal any sacral decubitus.   LABORATORY DATA:  White count is normal, hemoglobin is normal, platelet  count is normal.  Sodium is 119,  potassium is 2.9, chloride is 78,  glucose 158, BUN and creatinine normal, bilirubin 1.8, AST 48, ALT 37,  lipase is 20.   No imaging studies have been done.   ASSESSMENT:  This is a 77 year old African American female who has  hypertension, bladder cancer, who is status post BCG treatment to her  bladder yesterday, presents with nausea and vomiting overnight.  I spoke  with Dr. Dennie Maizes, who mentioned that BCG should not cause these  symptoms, so I think the patient may have gastroenteritis.  She is  already feeling better.  She has not thrown up since this morning.   PLAN:  1. Nausea and vomiting, likely gastroenteritis.  Treat this      symptomatically.  Give her clear liquids.  Advance slowly.  If she      has recurrence of her symptoms, she warrants imaging studies.  2. Severe hyponatremia and hypokalemia is likely a result of      dehydration.  We will give her IV fluids and replace her potassium.  3. Hypertension.  Continue with her Lotrel; however, hold off on the      diuretic.  4. History of dyslipidemia.  Continue with simvastatin.  5. History of GERD.  Continue with Protonix.  6. DVT prophylaxis will be initiated.   Further management decisions will depend on the results of further  testing and patient's response to treatment.      Osvaldo Shipper, MD  Electronically Signed     GK/MEDQ  D:  08/18/2008  T:  08/18/2008  Job:  [id number]   cc:   Dennie Maizes, M.D.  Fax: [phone]   Milus Mallick. [name], M.D.  Fax: [phone]

## 2010-09-19 NOTE — Consult Note (Signed)
NAMESUZZANNE, [name]              ACCOUNT NO.:  [id number]   MEDICAL RECORD NO.:  [id number]          PATIENT TYPE:  INP   LOCATION:  A329                          FACILITY:  [facility]   PHYSICIAN:  Margaretmary Dys, M.D.DATE OF BIRTH:  June 28, 1933   DATE OF CONSULTATION:  12/22/2007  DATE OF DISCHARGE:                                 CONSULTATION   REASON FOR CONSULTATION:  Inpatient management of multiple medical  problems, including hypertension and dyslipidemia.   HISTORY OF PRESENT ILLNESS:  [name] is a 77 year old female who  has a bladder tumor and hematuria.  The patient had a TURBT  (transurethral resection of a bladder tumor) today.   This was done by Dr. Rito Ehrlich.  The patient had an uneventful surgery.  The patient is now admitted to the medical floor for postoperative  management.  She has a little tinge of blood in her Foley catheter.  Otherwise, the patient reports that she is stable.  She has some mild  pain, but she says it is well controlled with her pain medications.  She  denies any fever or chills.  No nausea or vomiting.   REVIEW OF SYSTEMS:  Otherwise negative except as mentioned in the  history of present illness.   PAST MEDICAL HISTORY:  1. History of anxiety.  2. Palpitations.  3. Hypertension.  4. Dyslipidemia.  5. Depression.   MEDICATIONS:  1. Atenolol 25 mg p.o. once a day.  2. Hydrochlorothiazide 25 mg p.o. once a day.  3. Lotrel 10/20 one tablet p.o. daily.  4. Simvastatin 40 mg p.o. once a day.  5. Nexium 40 mg p.o. daily.  6. Flonase nasal spray.  7. Alprazolam 0.5 mg p.o. p.r.n.  8. Temazepam 50 mg p.o. nightly.  9. Tylenol.  10.Calcium with Vitamin D.  11.Multivitamins.  12.B12.  13.Folic acid.   ALLERGIES:  THE PATIENT IS ALLERGIC TO SULFA AND CODEINE.   FAMILY HISTORY:  Positive for diabetes, coronary artery disease and  history of cancer.  She does not know the site.   SOCIAL HISTORY:  She has three daughters living, all  grown.  One son  died at age 66, secondary to a heart attack.  She denies any alcohol  abuse or recreational drug use.   PHYSICAL EXAMINATION:  GENERAL:  The patient was conscious, alert,  comfortable, not in acute distress.  Well oriented in time, place and  person.  VITAL SIGNS:  Her blood pressure was 124/60 with a pulse of 64,  respirations 20.  Temperature 97.9 degrees Fahrenheit.  Oxygen  saturation was 98% on room air.  HEENT:  Normocephalic, atraumatic.  Oral mucosa was moist with no  exudates.  NECK:  Supple.  No JVD or lymphadenopathy.  LUNGS:  Clear.  Clinically good air entry bilaterally.  HEART:  S1-S2 regular.  No S3-S4, gallops or rubs.  ABDOMEN:  Soft, nontender.  Bowel sounds positive.  No masses palpable.  EXTREMITIES:  No pitting pedal edema.  The patient has a Foley catheter  in place.  CNS:  Grossly intact.   LABORATORY/DIAGNOSTIC DATA:  The patient's blood work on  December 19, 2007, showed a white blood cell count of 6, hemoglobin 12.9, hematocrit  38.3, platelet count was 247, sodium 139, potassium 3.8, chloride 99,  CO2 of 34, glucose 95, BUN of 16, creatinine 1.01, calcium is 10.1.   ASSESSMENT:  This is a 77 year old female, status post transurethral  resection of a bladder tumor by Dr. Rito Ehrlich, who has requested  inpatient management of the patient's hypertension and dyslipidemia.   PLAN:  1. Will resume all the patient's home medications at this time.  2. Will place the patient on telemetry as she says she has a history      of palpitations.  I did review an echocardiogram that was obtained      several years ago, which showed a normal ejection fraction.  3. I would continue to monitor the patient's hematocrit and      hemoglobin.  The patient will get a CBC and a BMET in a.m.      Overall, the patient remains stable at this time.  I will continue      to follow with Dr. Rito Ehrlich.   I would like to thank Dr. Rito Ehrlich for this consult.       Margaretmary Dys, M.D.  Electronically Signed     AM/MEDQ  D:  12/23/2007  T:  12/23/2007  Job:  [id number]   cc:   Dennie Maizes, M.D.  Fax: [phone]

## 2010-09-19 NOTE — Op Note (Signed)
NAME:  [name], [name]              ACCOUNT NO.:  [id number]   MEDICAL RECORD NO.:  [id number]          PATIENT TYPE:  AMB   LOCATION:  DAY                           FACILITY:  [facility]   PHYSICIAN:  Dennie Maizes, M.D.   DATE OF BIRTH:  Sep 13, 1933   DATE OF PROCEDURE:  07/19/2008  DATE OF DISCHARGE:                               OPERATIVE REPORT   PREOPERATIVE DIAGNOSES:  Recurrent bladder tumors, carcinoma of bladder  stage T1, N0, M0.   POSTOPERATIVE DIAGNOSES:  Recurrent bladder tumors, carcinoma of bladder  stage T1, N0, M0.   OPERATIVE PROCEDURE:  Cystoscopy, multiple bladder biopsies, bilateral  retrograde pyelograms, biopsy and fulguration of bladder tumors (1.5  cm).   ANESTHESIA:  Spinal.   SURGEON:  Dennie Maizes, MD   COMPLICATIONS:  None.   ESTIMATED BLOOD LOSS:  Minimal.   DRAINS:  18-French Foley catheter in the bladder.   SPECIMEN:  Bladder biopsies x3, bladder tumor x1.   INDICATIONS FOR THE PROCEDURE:  This 77 year old female had T1, N0, M0  carcinoma of bladder in August 2009.  She has undergone transverse  resection of bladder tumor at that time.  Recent cystoscopy in the  office revealed small recurrent bladder tumors.  The patient was brought  to the operating room today for cystoscopy, bilateral retrograde  pyelogram, multiple bladder biopsies, biopsy and fulguration of the  bladder tumors.   DESCRIPTION OF THE PROCEDURE:  Spinal anesthesia was induced and the  patient was placed on the OR table in the dorsal lithotomy position.  The lower abdomen and genitalia were prepped and draped in a sterile  fashion.  Cystoscopy was done with a 25-French scope.  The urethra,  bladder neck, and trigone were normal.  Both ureteral orifices were  normal.  There was small recurrent papillary flat bladder tumors in the  left lateral wall near the ureteral orifice, and also in the left side  of the posterior wall.  The total size of the bladder tumors was about  1.5 cm.   The rigid biopsy forceps, mucosal biopsies were done from the right  lateral wall, posterior wall, and left lateral wall of the bladder.  The  bladder tumors are also biopsied.  The renal electrode was then inserted  into the bladder.  The bladder tumors were fulgurated thoroughly up to  the muscle base.  Biopsy sites were then examined and fulgurated.   A 5-French wedge catheter was then placed in the left ureteral orifice.  About 7 mL of Renograffin was injected into the collecting system and a  retrograde pyelogram was done.  There was no evidence of any filling  defect or obstruction of the right collecting system.  The left  retrograde pyelogram also revealed no abnormality.  There was  no  evidence of any filling defects suggestive of upper tract urothelial  tumors.  The instruments were removed.  An 18-French Foley catheter was  inserted into the bladder.  The urine was clear.  Estimated blood loss  was minimal.  The patient was transferred to the PACU in a satisfactory  condition.  The patient  will receive intravesical mitomycin C in the  post anesthesia care unit.      Dennie Maizes, M.D.  Electronically Signed     SK/MEDQ  D:  07/19/2008  T:  07/19/2008  Job:  [id number]   cc:   Lodema Hong _

## 2010-09-19 NOTE — Assessment & Plan Note (Signed)
[facility]                          [facility] OFFICE NOTE   [name], [name]                     MRN:          [id number]  DATE:08/25/2008                            DOB:          1933-07-24    PRIMARY CARDIOLOGIST:  Jonelle Sidle, MD   REASON FOR VISIT:  Scheduled followup.   Ms. Boesen returns to our clinic following a recent scheduled followup  with Dr. Diona Browner, here on March 11.  At that time, Dr. Diona Browner  recommended that she increase her total daily dose of atenolol to 25 mg  q.a.m., with the addition of 12.5 mg q.p.m. for long-standing treatment  of palpitations.  He did suggest that if this proved ineffective, then  we might consider substituting this with pindolol.   Ms. Rossmann was recently briefly hospitalized at [facility], following  presentation with nausea/vomiting and diarrhea.  She was found to have  significant hyponatremia (119) and hypokalemia (2.9).  She was diagnosed  with possible acute gastroenteritis, and it was noted that she had  undergone recent treatment with BCG, by Dr. Rito Ehrlich, for recurrent  bladder tumors.  She had undergone previous bladder surgery in August  2009, for treatment of a carcinoma.   Clinically, Ms. Greer does not describe very much in the way of  symptoms with these long-standing palpitations.  She is aware of them,  however, and refers to it as a racing sensation, or at times as  fluttering.  They seem to occur on a near daily basis, typically in  the late afternoon.  She tells me that she takes her evening dose of  atenolol approximately 1-2 hours later.   Of note, Ms. Samons was previously evaluated with a 30-day event  monitoring in 2005, which was notable only for rare PVCs.   CURRENT MEDICATIONS:  1. Aspirin 81 daily.  2. Xanax 0.5 b.i.d.  3. Atenolol 25 mg q.a.m./12.5 mg q.p.m.  4. Lotrel 10/20 mg daily.  5. Hydrochlorothiazide 25 mg daily.  6. Loratadine 10  mg daily.  7. Flonase and nasal spray.  8. Nexium 40 daily.  9. Simvastatin 40 nightly.   PHYSICAL EXAMINATION:  VITAL SIGNS:  Blood pressure 103/54, pulse 59 and  regular, weight 103.  GENERAL:  A 77 year old female sitting upright in no distress.  HEENT:  Normocephalic, atraumatic.  NECK:  Palpable carotid pulse without bruits; no JVD at 90 degrees.  LUNGS:  Clear to auscultation in all fields.  HEART:  Regular rate and rhythm.  No significant murmurs.  No rubs.  ABDOMEN:  Benign.  EXTREMITIES:  No significant edema.  NEUROLOGIC:  Alert and oriented.   IMPRESSION:  1. Chronic palpitations.      a.     Negative 30-day event monitoring in 2005.      b.     Improved with the addition of low-dose atenolol q.p.m.  2. Relative hypotension.  3. Status post recent severe hyponatremia/hypokalemia.  4. Hypertension.  5. Normal left ventricular function.  6. Recurrent bladder cancer.      a.     Followed by Dr. Dennie Maizes.  PLAN:  1. Recommendation is that the patient take her evening dose of      atenolol in the late afternoon, when she seems to feel the onset of      these palpitations, for better control.  Of note, we are unable to      further up-titrate this medication, in light of her low normal      blood pressure and low resting heart rate.  2. The patient is scheduled to follow up with Dr. Syliva Overman      tomorrow.  She will need followup blood work at that time, for      close monitoring of her potassium and sodium levels.  3. Schedule return clinic followup with myself and Dr. Diona Browner in 4      months.      Rozell Searing, PA-C  Electronically Signed      Jonelle Sidle, MD  Electronically Signed   GS/MedQ  DD: 08/25/2008  DT: 08/26/2008  Job #: [id number]   cc:   Milus Mallick. Lodema Hong, M.D.

## 2010-09-19 NOTE — Assessment & Plan Note (Signed)
[facility]                          [facility] OFFICE NOTE   LIANNA, SITZMANN                     MRN:          [id number]  DATE:03/13/2007                            DOB:          April 12, 1934    PRIMARY CARE PHYSICIAN:  Dr. Mirna Mires   REASON FOR VISIT:  Cardiac followup.   HISTORY OF PRESENT ILLNESS:  Ms. Dottavio comes in for her annual visit.  She does not report any progressive problems with palpitations.  She  does state that she seems to be somewhat fatigued after taking her  atenolol in the morning.  We talked about perhaps moving this to an  evening dose.  Her electrocardiogram today shows sinus bradycardia at 56  beats per minute with no other significant changes.  She reports having  her cholesterol just recently obtained by Dr. Loleta Chance within the last  month.  Otherwise,  she has been trying to do some walking and has only mild NYHA class 2  dyspnea on exertion.  She is not reporting any chest pain.  She did have  carotid Dopplers after her last visit which showed no significant  hemodynamic stenoses with only a mild amount of plaque in the carotid  bulbs.   ALLERGIES:  SULFA DRUGS AND CODEINE.   PRESENT MEDICATIONS:  1. Lotrel 10/20 mg p.o. daily.  2. Simvastatin 40 mg p.o. q.h.s.  3. Hydrochlorothiazide 25 mg p.o. daily.  4. Atenolol 25 mg p.o. daily.  5. Xanax 2.5 mg p.o. b.i.d.  6. Aspirin 81 mg p.o. daily.  7. Nexium 40 mg p.o. daily.  8. Calcium supplements.   REVIEW OF SYSTEMS:  As described in history of present illness.  Otherwise, negative.   EXAMINATION:  Blood pressure is 130/66.  Heart rate is 59.  Weight is  121 pounds.  The patient is normally nourished, no acute distress.  HEENT:  Conjunctivae is normal.  Pharynx is clear.  NECK:  Supple.  No elevated jugular venous pressure, no loud bruits, no  thyromegaly.  LUNGS:  Clear without labored breathing at rest.  CARDIAC:  Regular rate and rhythm.  No  loud murmur or S3 gallop.  No  pericardial rub.  ABDOMEN:  Soft, nontender, no bruits.  EXTREMITIES:  No significant pitting edema.  Distal pulses are 2+  SKIN:  Warm and dry.  MUSCULOSKELETAL:  No kyphosis is noted.  NEURO/PSYCHIATRIC:  The patient is alert and oriented x3.  Affect is  normal.   IMPRESSION AND RECOMMENDATIONS:  1. History of palpitations, stable on low dose beta blocker.  I have      asked Ms. Barbaro to change her atenolol from a morning dose to an      evening dose to see if this helps with her sense of fatigue after      use of the medicine.  If not, she may be able to cut the dose back      to 12.5 mg daily.  2. Previous history of normal coronary arteries in March 2003.  The      patient is not reporting any significant chest pain  or limiting      dyspnea.  But in general, we will recommend continued risk factor      modification including cholesterol control.  LDL should be at least      under 100.  She continues on statin therapy.  3. History of mild plaque in the carotid bulbs without any      hemodynamically significant stenosis.  The patient continues on      aspirin.     Jonelle Sidle, MD  Electronically Signed    SGM/MedQ  DD: 03/13/2007  DT: 03/13/2007  Job #: [id number]   cc:   Annia Friendly. Loleta Chance, MD

## 2010-09-19 NOTE — Discharge Summary (Signed)
[name], [name]              ACCOUNT NO.:  [id number]   MEDICAL RECORD NO.:  [id number]          PATIENT TYPE:  INP   LOCATION:  A330                          FACILITY:  [facility]   PHYSICIAN:  Osvaldo Shipper, MD     DATE OF BIRTH:  [birth year]   DATE OF ADMISSION:  08/18/2008  DATE OF DISCHARGE:  04/15/2010LH                               DISCHARGE SUMMARY   Please review H and P dictated yesterday for details regarding the  patient's presenting illness.   The patient's PMD is Dr. Syliva Overman.   DISCHARGE DIAGNOSES:  1. Severe hyponatremia and hypokalemia, resolved.  2. Possibly acute gastroenteritis.  3. History of bladder cancer.  4. History of hypertension.  5. History of acid reflux disease.   BRIEF HOSPITAL COURSE:  Briefly, this is a 77 year old African American  female who unfortunately has bladder cancer who underwent BCG treatment  to her urinary bladder on Tuesday.  She started developing symptoms of  nausea, vomiting, and diarrhea on Tuesday night, presented to the ED on  Wednesday morning and was found to have sodium level of 119, potassium  of 2.9, chloride of 78, BUN and creatinine were normal.  The patient was  admitted to the hospital, started on normal saline.  Sodium come up to  140 today, potassium is 5.0, and chloride is 113.  Renal function is  normal.  She also had mildly abnormal LFTs which also have corrected.  The etiology for her abnormal LFT is not clear.  It is most likely that  she had symptoms of gastroenteritis.  I discussed the case with Dr.  Dennie Maizes, urologist who mentioned that BCG treatment is a very  localized treatment and should not cause any systemic symptoms.  However, the patient has continued to show improvement.  She is  tolerating p.o. intake.  She denies any complaints this morning except  for some heartburn.  Denies any abdominal pain.   PHYSICAL EXAMINATION:  VITAL SIGNS:  Temperature 98.2, heart rate is 69,  respiratory rate is 20, blood pressure is 116/58, and saturation 99% on  room air.  GENERAL:  A thin black female in no distress.  HEENT:  No pallor.  No icterus.  Oral mucous membrane is moist.  LUNGS:  Clear to auscultation bilaterally.  CARDIOVASCULAR:  S1 and S2  normal and regular.  ABDOMEN:  Soft, nontender, and nondistended.  No masses are present.  EXTREMITIES:  No edema is present in the lower extremities.   LABORATORY DATA:  Labs have been discussed above, hemoglobin is 11.9,  platelet count is 207, and white count is normal.   So, overall the patient has improved.  She is tolerating p.o. diet.  She  is ambulating and she is stable for discharge.   DISCHARGE MEDICATIONS:  No new prescriptions are being written.  She can  continue her home medications as follows:  1. Aspirin 81 mg daily.  2. Xanax 0.5 mg b.i.d.  3. Atenolol 25 mg daily.  4. Lotrel 10/20 one tablet daily.  5. Hydrochlorothiazide 25 mg daily.  6. Loratadine 10 mg daily.  7.  Fluticasone as before.  8. Os-Cal plus D daily.  9. Multivitamin daily.  10.Tylenol Extra Strength as needed.  11.Restoril 30 mg at bedtime.  12.Antivert 12.5 mg t.i.d. as needed.  13.Nexium 40 mg daily.  14.Simvastatin 40 mg daily.   Follow up with Dr. Lodema Hong in 1 week.   DIET:  I have asked her to eat soft diet and nonfried food for today and  then she can slowly restart her usual diet from tomorrow.   PHYSICAL ACTIVITY:  No restrictions.   No consultations obtained during this admission.  No imaging studies  done during this admission.      Osvaldo Shipper, MD  Electronically Signed     GK/MEDQ  D:  08/19/2008  T:  08/20/2008  Job:  [id number]   cc:   Milus Mallick. Lodema Hong, M.D.  Fax: [phone]   Dennie Maizes, M.D.  Fax: [phone]

## 2010-09-19 NOTE — H&P (Signed)
NAMEMarland Kitchen  [name], [name]              ACCOUNT NO.:  [id number]   MEDICAL RECORD NO.:  [id number]          PATIENT TYPE:  AMB   LOCATION:  DAY                           FACILITY:  [facility]   PHYSICIAN:  Dennie Maizes, M.D.   DATE OF BIRTH:  [birth year]   DATE OF ADMISSION:  07/19/2008  DATE OF DISCHARGE:  LH                              HISTORY & PHYSICAL   CHIEF COMPLAINT:  Recurrent bladder tumors, history of carcinoma  bladder, stage T1 and zero and zero.   HISTORY OF PRESENT ILLNESS:  This 77 year old female was referred to me  by Dr. Lodema Hong about 8 months ago.  The patient had intermittent gross  hematuria at that time.  Evaluation revealed a 3.5-cm size papillary  tumor on the left lateral wall of the bladder.  Transverse resection of  bladder tumor was done.  This revealed high-grade papillary urothelial  carcinoma of the bladder with invasion of lamina propria.  The patient  is kept under close followup.  Recent cystoscopy in the office revealed  small recurrent bladder tumors.  The cytology some positive for the  urothelial dysplasia suggestive of low-grade transitional carcinoma.   The patient denied having voiding difficulty or gross hematuria at  present.  Urinary frequency x3, nocturia x2 to three.   PAST MEDICAL HISTORY:  1. History of carcinoma bladder diagnosed and treated in August 2009.  2. History of palpitations.  3. Hypertension.  4. Elevated cholesterol.   MEDICATIONS:  1. Atenolol 25 mg p.o. daily.  2. Hydrochlorothiazide 25 mg p.o. daily.  3. Lotrel 10/20 p.o. daily.  4. Simvastatin 40 mg p.o. daily.  5. Nexium 40 mg p.o. daily.   1. Alprazolam 0.5 mg p.o. p.r.n.  2. Temazepam 50 mg p.o. at bedtime.  3. Tylenol.  4. Calcium with vitamin D.  5. Multivitamins.  6. B12.  7. Folic acid.   ALLERGIES:  SULFA and CODEINE.   FAMILY HISTORY:  Family history is positive for diabetes mellitus, heart  disease and cancer of unknown site.   PHYSICAL EXAMINATION:   VITAL SIGNS:  Height 5 feet 2 inches, weight 118  pounds.  HEENT: Normal.  NECK:.  No masses.  LUNGS:  Clear to auscultation.  HEART:  Regular rate and rhythm.  No murmurs.  ABDOMEN:  No palpable flank mass or CVA tenderness.  Bladder is not  palpable.   IMPRESSION:  Recurrent bladder tumors, history of carcinoma bladder,  stage T1,N0,M0.   PLAN:  I have discussed with the patient and her family regarding the  diagnosis and management options.  She is scheduled to undergo  cystoscopy, bilateral retrograde pyelograms, multiple bladder biopsies,  biopsy and fulguration of bladder tumors under anesthesia in short-stay  center.  She will receive intravesical mitomycin in the Post Anesthesia  Care Unit.  She will be kept in the hospital for observation.      Dennie Maizes, M.D.  Electronically Signed     SK/MEDQ  D:  07/18/2008  T:  07/19/2008  Job:  [id number]   cc:   Milus Mallick. Lodema Hong, M.D.  Fax: [phone]   [facility]  Fax: [phone]

## 2010-09-22 NOTE — Discharge Summary (Signed)
[facility]  Patient:    [name], [name] Visit Number: [id number] MRN: [id number]          Service Type: MED Location: [phone] Attending Physician:  Nelta Numbers Dictated by:   Guy Franco, P.A. Admit Date:  07/10/2001 Disc. Date: 07/14/01   CC:         Dr. Weyman Pedro, [address] [place], [place]  [address]  Dr. Kirstie Peri, [address], [place], [place]  [address]   Referring Physician Discharge [facility]  DISCHARGE DIAGNOSES: 1. Chest pain, noncardiac, resolved. 2. Hypertension, treated. 3. Arthritis. 4. Cataract surgery. 5. Status post tubal ligation in 1972.  HOSPITAL COURSE:  [name] is a 77 year old female patient who was admitted to [facility] on July 09, 2001 with recurrent chest pain.  She was consulted the following day by Dr. Simona Huh who proceeded to transfer her to [facility] for aggressive cardiac workup.  While in [place] she did have an initial set of cardiac isoenzymes which were negative.  At that time her EKG revealed sinus bradycardia with no acute changes.  A nuclear scan was performed prior to the admission on June 19, 2001 which was clinically negative without chest pain though nuclear data was mildly abnormal.  The wall motion was normal.  There was evidence of some decrease in the mid and distal anterior wall with reversibility suggesting some ischemia in that distribution.  The patient was transferred to [facility] on July 10, 2001 and her catheterization was eventually performed on July 14, 2001.  She was found to have normal coronaries with normal LV EF, calculated at 80%.  She was planned for discharge to home on the same day and was asked to follow up with Dr. Eliberto Ivory in the next week to evaluate her blood pressure and to check her right groin from cardiac catheterization.  LABORATORY DATA:  During her hospital stay at [facility] she had the following lab work performed:   Total cholesterol 189, triglycerides 88, LDL 112, HDL 59.  TSH 1.623.  CK-MB x2 negative.  At [facility] her BUN was 9, creatinine 1.0, sodium 137, potassium 3.9.  Hemoglobin 14.2, hematocrit 42.1, white cells 7.4, platelets 256.  DISCHARGE MEDICATIONS: 1. Altace 10 mg one p.o. q.d. 2. Zantac 150 mg one p.o. q.d. 3. Flonase nasal spray one spray each nostril q.d. 4. Atenolol 50 mg one p.o. q.d.  Apparently the patient has been on 25 mg a    day of atenolol; therefore, I have asked her to take two tablets in the    morning and to have her blood pressure checked by Dr. Eliberto Ivory to assure    that she has an adequate blood pressure. 5. She is to add enteric-coated aspirin 325 mg one p.o. q.d.  ACTIVITY:  No strenuous activity for two days and then gradually resume normal activity.  DIET:  Remain on a low cholesterol/low fat diet.  WOUND CARE:  Clean over catheterization site with soap and water.  FOLLOW-UP:  Call for any questions or concerns and follow up with Dr. Eliberto Ivory in one week. Dictated by:   Guy Franco, P.A. Attending Physician:  Nelta Numbers DD:  07/14/01 TD:  07/14/01 Job: [id number]

## 2010-09-22 NOTE — Procedures (Signed)
NAME:  [name], [name]                        ACCOUNT NO.:  [id number]   MEDICAL RECORD NO.:  [id number]                   PATIENT TYPE:  OBV   LOCATION:  A202                                 FACILITY:  [facility]   PHYSICIAN:  Duncombe Bing, M.D.               DATE OF BIRTH:  May 02, 1934   DATE OF PROCEDURE:  06/18/2003  DATE OF DISCHARGE:  06/18/2003                                  ECHOCARDIOGRAM   CLINICAL DATA:  A 77 year old woman with angina, dyspnea and hypertension.   M-MODE:  1. Aorta 2.6.  2. Left atrium 3.2.  3. Septum 1.1.  4. Posterior wall 0.8.  5. LV diastole 3.2.  6. LV systole 1.7.   FINDINGS:  1. Technically-adequate echocardiographic study.  2. Normal left atrium, right atrium and right ventricle.  3. Normal mitral valve.  4. Mild aortic valvular sclerosis; mild aortic annular calcification.  5. Normal tricuspid and pulmonic valves.  6. Normal IVC.  7. Normal internal dimension, wall thickness, regional and global function     of the left ventricle.  8. No change when compared to a prior study of October 07, 2002.      ___________________________________________                                             Bing, M.D.   RR/MEDQ  D:  06/18/2003  T:  06/18/2003  Job:  [id number]

## 2010-09-22 NOTE — Discharge Summary (Signed)
[place]  Patient:    [name], [name] Visit Number: [id number] MRN: [id number]          Service Type: MED Location: [phone] Attending Physician:  Nelta Numbers Dictated by:   Brita Romp, P.A.-C. Admit Date:  07/10/2001 Discharge Date: 07/15/2001   CC:         Weyman Pedro, M.D.  Jonelle Sidle, M.D. [facility], [place], [place]   Discharge Summary  ADDENDUM:  Due to some continued oozing at the catheterization site, [name] was kept overnight for observation.  The next day, she was seen by Dr. Eden Emms and felt that her groin was stable and ready for discharge.  The results of her right hip x-ray on July 13, 2001, are as follows:  There were degenerative changes consistent with arthritis but no acute findings. Dictated by:   Brita Romp, P.A.-C. Attending Physician:  Nelta Numbers DD:  07/15/01 TD:  07/17/01 Job: [id number]

## 2010-09-22 NOTE — Procedures (Signed)
   NAME:  [name], [name]                        ACCOUNT NO.:  [id number]   MEDICAL RECORD NO.:  [id number]                   PATIENT TYPE:  OUT   LOCATION:  RAD                                  FACILITY:  [facility]   PHYSICIAN:  Maisie Fus C. Wall, M.D.                DATE OF BIRTH:  Oct 15, 1933   DATE OF PROCEDURE:  DATE OF DISCHARGE:                                  ECHOCARDIOGRAM   INDICATIONS:  Murmur.  785.2.   STUDY:  The echocardiogram is of adequate quality.   CONCLUSION:  1. Normal left and right heart size.  2. Normal left ventricular chamber size and overall systolic function.     There is mild anteroseptal hypokinesia.  The overall ejection fraction is     normal, though, at 50-55%.  3. No evidence of left ventricular hypertrophy.  4. Normal right-sided structures and function.  5. No important valve abnormalities.                                                 Thomas C. Wall, M.D.    TCW/MEDQ  D:  10/07/2002  T:  10/07/2002  Job:  [id number]   cc:   Annia Friendly. Loleta Chance, M.D.  [address]  [place]  [place] [address]  Fax: [phone]

## 2010-09-22 NOTE — Assessment & Plan Note (Signed)
[facility]                            [facility] OFFICE NOTE   Tamara, Washington                     MRN:          [id number]  DATE:02/19/2006                            DOB:          1933/07/11    PRIMARY CARDIOLOGIST:  Dr. Simona Huh.   REASON FOR OFFICE VISIT:  Schedule annual followup.   [name] is a very pleasant 77 year old female with history of  palpitations and hypertension who presents for scheduled followup. The  patient has no known history of coronary artery disease with a previous  coronary angiogram in March 2003 which was normal. More recently she had a  negative stress echocardiogram in May 2005.   Since last seen here in the clinic one year ago by Dr. Diona Browner, the patient  reports no significant change from her baseline. She continues to have  occasional palpitations but with no significant increase in either frequency  or severity. Additionally these are described more of a thumping sensation  rather than as actual flutter. She also has no significant associated  symptoms when she experiences these. These typically occur under periods of  stress.   The patient denies any exertional angina pectoris. She does have some mild  exertional dyspnea when walking up the hill but not on level ground.   CURRENT MEDICATIONS:  1. Lotrel 10/20 daily.  2. HCTZ 25 daily.  3. Atenolol 25 daily.  4. Simvastatin 40 q.h.s.  5. Xanax 0.5 b.i.d.  6. Aspirin 81 daily.  7. Prevacid 30 daily.  8. Loratadine 10 daily.   PHYSICAL EXAMINATION:  VITAL SIGNS:  Blood pressure 118/64, pulse 64,  regular weight 136.8.  GENERAL:  A 77 year old female sitting upright no apparent distress.  NECK:  Palpable bilateral carotid pulses with soft distant left carotid  bruit; no bruit on the right.  LUNGS:  Clear to auscultation all fields.  HEART:  Soft grade 1/6 systolic ejection murmur at the base.  ABDOMEN:  Soft, nontender.  EXTREMITIES:  Intact. Distal pulses without edema.  NEUROLOGICAL:  No focal deficits.   IMPRESSION:  1. Longstanding palpitations.      a.     Stable.  2. Left carotid bruit.  3. Hypertension.      a.     Multimodal therapy.  4. History of atypical chest pain.      a.     Normal coronary angiogram March 2003.      b.     Negative exercise stress echocardiogram May 2005.  5. Anxiety.  6. Hyperlipidemia.      a.     Followed by Dr. Dorothyann Peng.   PLAN:  1. Scheduled carotid Dopplers for further evaluation of left carotid      bruit.  2. Schedule return clinic followup with Dr. Levin Bacon in one year.      ______________________________  Rozell Searing, PA-C    ______________________________  Jonelle Sidle, MD    GS/MedQ  DD:  02/19/2006  DT:  02/20/2006  Job #:  [id number]   cc:   Candyce Churn. Allyne Gee, M.D.

## 2010-09-22 NOTE — Consult Note (Signed)
NAME:  [name], VETTER NO.:  [id number]   MEDICAL RECORD NO.:  [id number]                   PATIENT TYPE:  OBV   LOCATION:  A202                                 FACILITY:  [facility]   PHYSICIAN:  Cecil Cranker, M.D.             DATE OF BIRTH:  03/18/1934   DATE OF CONSULTATION:  DATE OF DISCHARGE:                                   CONSULTATION   The patient is a 77 year old black female with history of normal coronary  angiography and left ventricular angiography in March 2003 after false-  positive stress Cardiolite which suggested possibly some anterior ischemia.  The patient was told of a click in the heart many years ago at [facility]  and she has had nocturnal palpitations for some time. Last evening she  noticed onset of chest pain, nonexertional, and no associated nausea,  vomiting, diaphoresis, or shortness of breath. She was brought to the ER by  her friend. The symptoms persisted on arrival. She was given aspirin 362,  nitroglycerin sublingual times three with resolution of chest pain. She has  risks factors as follows: Hypertension, hyperlipidemia. She also has a  history of GERD and chronic anxiety. She has never had diabetes, cancer,  seizure. No prior history of cardiac disease other than the click and the  palpitations.   PAST MEDICAL HISTORY:  1. Cholecystectomy.  2. Tubal ligation.  3. A 2-D echocardiogram in June 2004 revealed an EF of 50% to 55%, without     valvular abnormality.   SOCIAL HISTORY:  The patient lives in [place]. She has never used tobacco.   FAMILY HISTORY:  Sister died at 59 of a stroke. Brother died at 59 of MI.   REVIEW OF SYSTEMS:  Unremarkable except as noted in the present illness.   PHYSICAL EXAMINATION:  VITAL SIGNS: Blood pressure 140/57, pulse 66, sinus  bradycardia, respirations normal, temperature afebrile.  GENERAL APPEARANCE: Normal. JVP is not elevated. Carotid pulses normal and  without bruits.  LUNGS: Clear.  CARDIAC: 1-2/6 short systolic ejection murmur in the pulmonic area and a 1-  2/6 pansystolic murmur at the apex with a late midsystolic click.  ABDOMEN: Unremarkable.  EXTREMITIES: Normal. Pulses felt equally bilaterally.   EKG is normal.   IMPRESSION:  1. Nonexertional chest pain with history of normal coronary angiography in     March 2003.  2. Mitral valve prolapse, now mitral regurgitation.  3. Hypertension.  4. Hyperlipidemia.  5. Gastroesophageal reflux disease.   The symptoms are somewhat atypical for coronary disease. I have suggested a  stress test which I think would best be in the form of a stress echo.   Thank you for the opportunity to see this nice patient in consultation.      ___________________________________________  Cecil Cranker, M.D.   EJL/MEDQ  D:  06/17/2003  T:  06/18/2003  Job:  [id number]   cc:   Annia Friendly. Loleta Chance, M.D.  [address]  [place]  [place] [address]  Fax: [phone]   Jonelle Sidle, M.D. [facility]

## 2010-09-22 NOTE — Cardiovascular Report (Signed)
[place]  Patient:    ABILENE, MCPHEE Visit Number: [id number] MRN: [id number]          Service Type: MED Location: [phone] Attending Physician:  Nelta Numbers Dictated by:   Noralyn Pick Eden Emms, M.D. [facility] Proc. Date: 07/14/01 Admit Date:  07/10/2001   CC:         Weyman Pedro, M.D.  Jonelle Sidle, M.D. [facility]   Cardiac Catheterization  PROCEDURE PERFORMED: Coronary arteriography.  INDICATIONS: Recurrent chest pain and palpitations with Cardiolite study suggesting anterior wall ischemia.  DESCRIPTION OF PROCEDURE: Standard catheters were used for the left coronary artery. A Noto catheter was used for the right coronary artery.  The patient has a small aortic root. Gary Fleet was done from the right femoral artery.  Left main coronary artery was normal.  Left anterior descending artery was normal. It was a large artery and wrapped the apex. It was somewhat circuitous given the patients high blood pressure. The first diagonal branch was normal. The second diagonal branch was normal.  The circumflex coronary artery was normal.  The right coronary artery was small and it was normal.  Aortic pressure was in the 184/63 range. LV pressure was in the 184/18 range.  RIGHT ANTERIOR OBLIQUE VENTRICULOGRAPHY: RAO ventriculography showed hyperdynamic LV function with no evidence of wall motion abnormalities. Ejection fraction was in excess of 80%.  An aortic root injection was done since the patient had significant chest pain and severe hypertension. There was no evidence of aortic dissection.  There was no aortic insufficiency.  DISTAL AORTOGRAM: Distal aortography was done due to the patients severe hypertension to rule out renal artery stenosis. There was no evidence of this.  IMPRESSION: The patients primary problem would appear to be significant hypertension. She was given enalapril and nitroglycerin before sheath  removal. She will be discharged later today to follow up with Dr. Eliberto Ivory.  She has no evidence of coronary disease and the Cardiolite study would appear to be a false-positive.  In addition, there was no evidence of aortic dissection and her blood pressure would appear to be essential hypertension with no evidence of renal artery stenosis. Dictated by:   Noralyn Pick Eden Emms, M.D. [facility] Attending Physician:  Nelta Numbers DD:  07/14/01 TD:  07/14/01 Job: [id number]

## 2010-09-22 NOTE — H&P (Signed)
NAME:  [name], [name]                        ACCOUNT NO.:  [id number]   MEDICAL RECORD NO.:  [id number]                   PATIENT TYPE:  OBV   LOCATION:  A202                                 FACILITY:  [facility]   PHYSICIAN:  Annia Friendly. Loleta Chance, M.D.                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   CHIEF COMPLAINT:  Chest heaviness.   Chest heaviness occurred at 0600 on the day of admission.  The patient  noticed chest discomfort upon talking on the telephone this morning.  The  heaviness was located in the center of the chest.  She also experienced  increased heart rate and feeling of faint-like.  The patient denied arm  pain, shortness of breath, nausea, vomiting, or sweating.  She became afraid  because of chest discomfort, and therefore she drove herself to a friend's  house in [place].  The patient's friend transported her to the emergency  room for evaluation.  The chest heaviness persisted upon arrival to the  hospital.  In the emergency room she was given four aspirin 81 mg each p.o.,  nitroglycerin sublingual every five minutes x3 with resolution of her chest  discomfort.  Risk factors are positive for hypertension, hyperlipidemia, and  postmenopausal black female.   Medical history is also positive for gastroesophageal reflux disease and  chronic anxiety.  Medical history is negative for diabetes, tuberculosis,  cancer, sickle cell, asthma, seizure disorder, and no history of heart  disease.   Prescribed medications on admission:  1. Prilosec 20 mg p.o. daily.  2. Hydrochlorothiazide 25 mg p.o. daily.  3. Atenolol 25 mg p.o. daily.  4. Lotrel 10/20 mg one capsule p.o. every day.  5. Zocor 40 mg p.o. at bedtime.  6. One aspirin 81 mg p.o. every day.   The  patient is allergic to SULFA DRUGS (rash) and CODEINE (itching).   HABITS:  Negative for use of ethanol, tobacco products, and street drugs.   PAST MEDICAL HISTORY:  Positive for hospitalization for pregnancy,  cholecystectomy at [facility] in 2001 secondary to cholecystitis by  Dr. Marlene Bast, bilateral tubal ligation at age 54.   FAMILY HISTORY:  Mother deceased at 40 secondary to complications of  pneumonia; father deceased, cause unknown.  Three sisters living at age 74,  history of diabetes; age 24, with a history of diabetes mellitus; age 45,  with a history of hypertension.  Two sisters deceased, age 62, secondary to  cancer; and age 62, secondary to complication of stroke.  One brother living  at age 41 with a history of hypertension.  One brother deceased secondary to  heart attack.  Three daughters living, age 14, with history of hypertension;  age 68, good health; age 44, mental retardation.  One son deceased at age 90  secondary to heart attack.   REVIEW OF SYSTEMS:  Positive for episodic joint pain and stiffness (knees  and shoulders), chronic right shoulder pain secondary to pinched nerve  according to the patient, insomnia, episodes of heart palpitations.  The  patient indicated that she had a heart catheterization in March 2003 at  [facility].  Review of systems negative for epistaxis,  dysphagia, bleeding gums, hematemesis, gross hematuria, dysuria, vaginal  bleeding, vaginal itching, edema of legs, melena, diarrhea, and  constipation.  Last menstrual period was age 21.   PHYSICAL EXAMINATION:  VITAL SIGNS:  Vitals in the emergency room at 0940,  temperature 97.2, blood pressure 140/57, pulse 64, respirations 20.  GENERAL:  A slight, small-framed, medium-height, alert black female who is  resting quietly in no apparent respiratory distress.  SKIN:  Warm and dry.  HEENT:  Head normocephalic.  Ears:  Normal auricle.  External canal patent,  tympanic  membrane pearly gray.  Eyes:  Lids negative for ptosis.  Sclerae  white.  Pupils round, equal, and reactive to light.  Extraocular movements  intact.  Nose:  Negative for discharge.  Mouth:  Dentition good, positive  missing teeth, no bleeding gums, posterior pharynx benign.  NECK:  Negative for lymphadenopathy or thyromegaly.  CHEST:  Lungs clear.  CARDIAC:  Positive S1, S2, with snap, regular rate and rhythm.  BREASTS:  No skin change, no nodule on palpation.  Nipples erect without  discharge.  ABDOMEN:  No distention, hypoactive bowel sounds.  Soft, nontender in all  four quadrants.  No palpable mass, no organomegaly.  PELVIC:  Deferred.  RECTAL:  Deferred.  EXTREMITIES:  No edema, no joint swelling, no joint redness, no joint  hotness.  Palpable dorsalis pedis bilaterally.  No tibial edema.  NEUROLOGIC:  Alert and oriented to person, place, and time.  Cranial nerves  II-XII appeared intact.   Chest x-ray was read as no active disease.  Labs:  White count 6.9,  hemoglobin 13.6, hematocrit 40.0, platelets 293,000.  Prothrombin time 12.7  seconds.  Partial thromboplastin time 28 seconds.  Total protein 6.7,  albumin 3.9, SGOT 34, SGPT 21, alkaline phosphatase 62, total bilirubin 0.8.  Sodium 136, potassium 3.8, chloride 99, CO2 33, glucose 123, BUN 11,  creatinine 1.0, calcium 9.8.  Total CPK 277, CK-MB 2.6, troponin I of less  than 0.01.  EKG demonstrated a normal sinus rhythm with a rate of 58, less  than 0.04 ST elevation in lead II and lead III.  No Q-waves.   IMPRESSION:  Atypical chest pain.   SECONDARY DIAGNOSES:  1. Hypertension.  2. Chronic anxiety.  3. Hyperlipidemia.  4. Gastroesophageal reflux disease.   PLAN:  1. Admit to telemetry.  2. Cardiology consult.  3. IV normal saline at [facility].  4. Activity:  Bed rest.  5. Medication:  Oxygen at 2 L/min. per nasal cannula, Protonix 40 mg p.o.    daily, nitroglycerin 1/150 sublingual p.r.n. for chest pain based on      protocol. Tylenol 500 mg p.o. q.4-6h. p.r.n. for headache,     hydrochlorothiazide 25 mg p.o. daily, atenolol 25 mg p.o. daily, one     aspirin 81 mg p.o. daily, Zocor 50 mg p.o. daily, calcium 500 mg p.o.     t.i.d., Lotrel 10/20 mg one capsule p.o. every day, Xanax 0.25 mg p.o.     b.i.d., nitroglycerin patch 0.2  mg per hour every 24 hours.  6. Diet:  4 g sodium, low cholesterol.  7. Labs:  Thyroid functions, urinalysis, repeat cardiac enzymes q.8h. x2.     ___________________________________________                                         Annia Friendly. Loleta Chance, M.D.   Levonne Hubert  D:  06/17/2003  T:  06/17/2003  Job:  [id number]

## 2010-10-05 ENCOUNTER — Encounter: Payer: Self-pay | Admitting: Family Medicine

## 2010-10-05 ENCOUNTER — Ambulatory Visit (INDEPENDENT_AMBULATORY_CARE_PROVIDER_SITE_OTHER): Payer: Medicare Other | Admitting: Family Medicine

## 2010-10-05 VITALS — BP 120/70 | HR 58 | Resp 16 | Ht 61.75 in | Wt 110.4 lb

## 2010-10-05 DIAGNOSIS — R519 Headache, unspecified: Secondary | ICD-10-CM | POA: Insufficient documentation

## 2010-10-05 DIAGNOSIS — F411 Generalized anxiety disorder: Secondary | ICD-10-CM

## 2010-10-05 DIAGNOSIS — R51 Headache: Secondary | ICD-10-CM

## 2010-10-05 DIAGNOSIS — I10 Essential (primary) hypertension: Secondary | ICD-10-CM

## 2010-10-05 DIAGNOSIS — E785 Hyperlipidemia, unspecified: Secondary | ICD-10-CM

## 2010-10-05 MED ORDER — ALPRAZOLAM 0.5 MG PO TABS: 0.5000 mg | ORAL_TABLET | Freq: Every day | ORAL | Status: DC

## 2010-10-05 MED ORDER — ACETAMINOPHEN 325 MG PO TABS
325.0000 mg | ORAL_TABLET | Freq: Once | ORAL | Status: AC
  Administered 2010-10-05: 325 mg via ORAL

## 2010-10-05 MED ORDER — KETOROLAC TROMETHAMINE 60 MG/2ML IM SOLN
30.0000 mg | Freq: Once | INTRAMUSCULAR | Status: AC
  Administered 2010-10-05: 30 mg via INTRAMUSCULAR

## 2010-10-05 NOTE — Assessment & Plan Note (Signed)
Controlled, no change in medication  

## 2010-10-05 NOTE — Assessment & Plan Note (Signed)
Deteriorated, injecion in office and tylenol, also mRI brain

## 2010-10-05 NOTE — Progress Notes (Signed)
  Subjective:    Patient ID: [name], female    DOB: [birth year], 77 y.o.   MRN: [id number]  HPI Headache on the crown of her head , for the past 4 hrs, advil did not relieve fully, though slightly improved. Did feel slightly faint this morning but occurs when she takes her meds in the morning. Was a 6 earlier this morning. Denies any recent fever or chills    Review of Systems Denies recent fever or chills. Denies sinus pressure, nasal congestion, ear pain or sore throat. Denies chest congestion, productive cough or wheezing. Denies chest pains, palpitations, paroxysmal nocturnal dyspnea, orthopnea and leg swelling Denies abdominal pain, nausea, vomiting,diarrhea or constipation.  Denies joint pain, swelling and limitation in mobility. Denies seizure, numbness, or tingling. Denies depression,uncontrolled anxiety or insomnia. Denies skin break down or rash.        Objective:   Physical Exam Patient alert and oriented and in no Cardiopulmonary distress.  HEENT: No facial asymmetry, EOMI, no sinus tenderness, TM's clear, Oropharynx pink and moist.  Neck supple no adenopathy.  Chest: Clear to auscultation bilaterally.  CVS: S1, S2 no murmurs, no S3.  ABD: Soft non tender. Bowel sounds normal.  Ext: No edema  Psych: Good eye contact, normal affect. Memory intact not anxious or depressed appearing.  CNS: CN 2-12 intact, power, tone and sensation normal throughout.        Assessment & Plan:

## 2010-10-05 NOTE — Patient Instructions (Signed)
F/u as before.  You will be referred for a brain scan to evaluate your headaches.  You will get an appt for rept chest CT scan in November.  You will get toradol 30mg  iM aand 1 tylenol tablet for your headache in the office today, this should make you feel much better soon

## 2010-10-08 NOTE — Assessment & Plan Note (Signed)
Low fat diet discussed and encouraged 

## 2010-10-08 NOTE — Assessment & Plan Note (Signed)
Controlled, no change in medication  

## 2010-10-10 ENCOUNTER — Telehealth: Payer: Self-pay | Admitting: Family Medicine

## 2010-10-11 ENCOUNTER — Ambulatory Visit (HOSPITAL_COMMUNITY)
Admission: RE | Admit: 2010-10-11 | Discharge: 2010-10-11 | Disposition: A | Discharge status: Home / Self Care | Payer: Medicare Other | Source: Ambulatory Visit | Attending: Family Medicine | Admitting: Family Medicine

## 2010-10-11 ENCOUNTER — Other Ambulatory Visit: Payer: Self-pay | Admitting: Family Medicine

## 2010-10-11 DIAGNOSIS — C679 Malignant neoplasm of bladder, unspecified: Secondary | ICD-10-CM | POA: Insufficient documentation

## 2010-10-11 DIAGNOSIS — R51 Headache: Secondary | ICD-10-CM | POA: Insufficient documentation

## 2010-10-11 MED ORDER — ACETAMINOPHEN 325 MG PO TABS: ORAL_TABLET | ORAL | Status: DC

## 2010-10-11 NOTE — Telephone Encounter (Signed)
Patient aware.

## 2010-10-11 NOTE — Telephone Encounter (Signed)
Done. pls let her know

## 2010-10-13 ENCOUNTER — Telehealth: Payer: Self-pay | Admitting: Family Medicine

## 2010-10-13 ENCOUNTER — Other Ambulatory Visit: Payer: Self-pay

## 2010-10-13 MED ORDER — ACETAMINOPHEN 500 MG PO TABS: 1000.0000 mg | ORAL_TABLET | Freq: Two times a day (BID) | ORAL | Status: DC | PRN

## 2010-10-13 NOTE — Telephone Encounter (Signed)
Patient aware.

## 2010-10-16 NOTE — Telephone Encounter (Signed)
aware

## 2010-11-09 ENCOUNTER — Encounter: Payer: Self-pay | Admitting: Family Medicine

## 2010-11-20 ENCOUNTER — Encounter: Payer: Self-pay | Admitting: Family Medicine

## 2010-11-20 ENCOUNTER — Ambulatory Visit: Payer: Medicare Other | Admitting: Family Medicine

## 2010-11-20 ENCOUNTER — Encounter: Payer: Medicare Other | Admitting: Family Medicine

## 2010-11-21 ENCOUNTER — Ambulatory Visit (INDEPENDENT_AMBULATORY_CARE_PROVIDER_SITE_OTHER): Payer: Medicare Other | Admitting: Family Medicine

## 2010-11-21 ENCOUNTER — Encounter: Payer: Self-pay | Admitting: Family Medicine

## 2010-11-21 VITALS — BP 120/60 | HR 54 | Resp 16 | Ht 61.0 in | Wt 111.0 lb

## 2010-11-21 DIAGNOSIS — IMO0002 Reserved for concepts with insufficient information to code with codable children: Secondary | ICD-10-CM

## 2010-11-21 DIAGNOSIS — M549 Dorsalgia, unspecified: Secondary | ICD-10-CM

## 2010-11-21 DIAGNOSIS — I10 Essential (primary) hypertension: Secondary | ICD-10-CM

## 2010-11-21 DIAGNOSIS — F411 Generalized anxiety disorder: Secondary | ICD-10-CM

## 2010-11-21 DIAGNOSIS — K219 Gastro-esophageal reflux disease without esophagitis: Secondary | ICD-10-CM

## 2010-11-21 MED ORDER — KETOROLAC TROMETHAMINE 30 MG/ML IJ SOLN: 30.0000 mg | Freq: Once | INTRAMUSCULAR | Status: DC

## 2010-11-21 MED ORDER — METHYLPREDNISOLONE ACETATE 80 MG/ML IJ SUSP: 40.0000 mg | Freq: Once | INTRAMUSCULAR | Status: DC

## 2010-11-21 MED ORDER — PREDNISONE (PAK) 5 MG PO TABS: 5.0000 mg | ORAL_TABLET | ORAL | Status: DC

## 2010-11-21 NOTE — Patient Instructions (Addendum)
CPE in september  The pain in your right thigh is due to arthritis in your low back and bulging disc, probably pressing on your nerve  You will receive toradol and depomedrol injections in the office today.  Alleve is to be started one twice daily in the morning for 7 days total. Pls stop  tylenol  For the next 1 week Prednisone is prescribed, and is to be started tomorrow also  . It is OK to stop the prednisone and the alleve after 3 days if your pain is gone  Call if not improved

## 2010-11-22 DIAGNOSIS — IMO0002 Reserved for concepts with insufficient information to code with codable children: Secondary | ICD-10-CM

## 2010-11-22 MED ORDER — METHYLPREDNISOLONE ACETATE 40 MG/ML IJ SUSP
40.0000 mg | Freq: Once | INTRAMUSCULAR | Status: AC
  Administered 2010-11-22: 40 mg via INTRAMUSCULAR

## 2010-11-22 MED ORDER — KETOROLAC TROMETHAMINE 30 MG/ML IJ SOLN
60.0000 mg | Freq: Once | INTRAMUSCULAR | Status: AC
  Administered 2010-11-22: 60 mg via INTRAMUSCULAR

## 2010-11-22 MED ORDER — KETOROLAC TROMETHAMINE 30 MG/ML IJ SOLN: 60.0000 mg | Freq: Once | INTRAMUSCULAR | Status: AC

## 2010-11-24 NOTE — Assessment & Plan Note (Signed)
Improved and controlled  

## 2010-11-24 NOTE — Progress Notes (Signed)
  Subjective:    Patient ID: [name], female    DOB: 1933-05-15, 77 y.o.   MRN: [id number]  HPI C/o 2 week h/o right thigh pain disturbing her sleep and limiting her abilit to function, also a numbness and coldness in the thigh. She denies trauma or weakness, has established dis disease in the spine. Pain radiates from low back to right groin and anterior thigh. No other complaints or concerns   Review of Systems Denies recent fever or chills. Denies sinus pressure, nasal congestion, ear pain or sore throat. Denies chest congestion, productive cough or wheezing. Denies chest pains, palpitations, paroxysmal nocturnal dyspnea, orthopnea and leg swelling Denies abdominal pain, nausea, vomiting,diarrhea or constipation.  Denies rectal bleeding or change in bowel movement. Denies dysuria, frequency, hesitancy or incontinence. Denies headaches,  Denies depression, anxiety  And insomnia are improved and controlled with medication Denies skin break down or rash.        Objective:   Physical Exam Patient alert and oriented and in no Cardiopulmonary distress.  HEENT: No facial asymmetry, EOMI, no sinus tenderness, , Oropharynx pink and moist.  Neck supple no adenopathy.  Chest: Clear to auscultation bilaterally.  CVS: S1, S2 no murmurs, no S3.  ABD: Soft non tender. Bowel sounds normal.  Ext: No edema MS:  Decreased ROM lumbar spine adequate in , shoulders, hips and knees.  Skin: Intact, no ulcerations or rash noted.  Psych: Good eye contact, normal affect. Memory intact not anxious or depressed appearing.  CNS: CN 2-12 intact, power,  normal throughout.Slightly decreased sensation right thigh        Assessment & Plan:

## 2010-11-24 NOTE — Assessment & Plan Note (Signed)
Controlled, no change in medication  

## 2010-11-24 NOTE — Assessment & Plan Note (Signed)
Deteriorated, anti-inflammatory injections in the office and script for prednisone also

## 2010-11-24 NOTE — Assessment & Plan Note (Signed)
Currently controlled.

## 2010-11-27 ENCOUNTER — Ambulatory Visit (INDEPENDENT_AMBULATORY_CARE_PROVIDER_SITE_OTHER): Payer: Medicare Other | Admitting: Internal Medicine

## 2010-11-27 ENCOUNTER — Encounter (INDEPENDENT_AMBULATORY_CARE_PROVIDER_SITE_OTHER): Payer: Self-pay | Admitting: Internal Medicine

## 2010-11-27 VITALS — BP 118/68 | HR 70 | Temp 97.9°F | Ht 62.0 in | Wt 112.0 lb

## 2010-11-27 DIAGNOSIS — R1013 Epigastric pain: Secondary | ICD-10-CM

## 2010-11-27 NOTE — Patient Instructions (Addendum)
1.Take pantoprazole 4o mg by mouth befor evening meal and at bedtime.  2. Heme occult once.  3. Colace 2 tablets by mouth daily at bedtime for constipation.  4. Decrease Alleve to once daily after two weeks; always take with food.

## 2010-11-27 NOTE — Progress Notes (Signed)
Subjective: epigastric pain.     Patient ID: [name], female   DOB: 11/16/1933, 77 y.o.   MRN: [id number]  Abdominal Pain This is a recurrent problem. Onset quality: and nagging. The problem occurs daily. The pain is located in the epigastric region. The pain is mild. The quality of the pain is dull. The abdominal pain does not radiate. Associated symptoms include constipation (she takes MOM twice a week.). Pertinent negatives include no anorexia, diarrhea, hematochezia, melena, nausea, vomiting or weight loss.  Pain generally occurs at night. She wonders if she should take 3 protonix tablets. She does not have any pain during the daytime. Marland Kitchen She was treated for H. Pylori infection 2009. Last EGD was in April 2009 revealing H.P. Gastritis. She has history of colonic polyps and last TCS by Dr. Gabriel Cirri possibly in 2010,   Review of Systems  Constitutional: Negative for weight loss.  Gastrointestinal: Positive for abdominal pain and constipation (she takes MOM twice a week.). Negative for nausea, vomiting, diarrhea, melena, hematochezia and anorexia.       Objective:   Physical Exam  Constitutional: She appears well-developed.  HENT:  Mouth/Throat: Oropharynx is clear and moist.  Eyes: Conjunctivae are normal. No scleral icterus.  Neck: Neck supple. No thyromegaly present.  Cardiovascular: Normal rate, regular rhythm and normal heart sounds.   No murmur heard. Pulmonary/Chest: Breath sounds normal.  Abdominal: Bowel sounds are normal. She exhibits no distension. There is no splenomegaly or hepatomegaly. There is tenderness (mild midepigastric tenderness.).  Musculoskeletal: She exhibits no edema.  Skin: Skin is warm and dry.   Filed Vitals:   11/27/10 1552  BP: 118/68  Pulse: 70  Temp: 97.9 F (36.6 C)      Assessment:    Chronic Epigastric pain primarily at night; need to rule rec HP infection or PUD secondary to NSAID.    Plan:     1. Take Protonix 40 mg po before  evening meal and at bedtime. 2. Heme occult once. 3. Stool Antigen for H.Pylori. 4. Decrease Alleve to once a day as soon as possible. 5. OV in 2 months.

## 2010-12-01 ENCOUNTER — Other Ambulatory Visit: Payer: Self-pay

## 2010-12-01 DIAGNOSIS — I10 Essential (primary) hypertension: Secondary | ICD-10-CM

## 2010-12-01 MED ORDER — ATENOLOL 25 MG PO TABS: 25.0000 mg | ORAL_TABLET | Freq: Every day | ORAL | Status: DC

## 2010-12-15 ENCOUNTER — Encounter (INDEPENDENT_AMBULATORY_CARE_PROVIDER_SITE_OTHER): Payer: Self-pay

## 2010-12-18 ENCOUNTER — Encounter (HOSPITAL_COMMUNITY): Payer: Self-pay | Admitting: *Deleted

## 2010-12-18 ENCOUNTER — Emergency Department (HOSPITAL_COMMUNITY)
Admission: EM | Admit: 2010-12-18 | Discharge: 2010-12-18 | Disposition: A | Discharge status: Home / Self Care | Payer: Medicare Other | Attending: Emergency Medicine | Admitting: Emergency Medicine

## 2010-12-18 DIAGNOSIS — I10 Essential (primary) hypertension: Secondary | ICD-10-CM | POA: Insufficient documentation

## 2010-12-18 DIAGNOSIS — R1013 Epigastric pain: Secondary | ICD-10-CM | POA: Insufficient documentation

## 2010-12-18 DIAGNOSIS — K219 Gastro-esophageal reflux disease without esophagitis: Secondary | ICD-10-CM | POA: Insufficient documentation

## 2010-12-18 DIAGNOSIS — Z8551 Personal history of malignant neoplasm of bladder: Secondary | ICD-10-CM | POA: Insufficient documentation

## 2010-12-18 LAB — CBC
MCH: 31.7 pg (ref 26.0–34.0)
Platelets: 164 10*3/uL (ref 150–400)
RBC: 4.17 MIL/uL (ref 3.87–5.11)
WBC: 6.2 10*3/uL (ref 4.0–10.5)

## 2010-12-18 LAB — BASIC METABOLIC PANEL
CO2: 24 mEq/L (ref 19–32)
Calcium: 9.7 mg/dL (ref 8.4–10.5)
GFR calc non Af Amer: >60 mL/min (ref >60)
Sodium: 143 mEq/L (ref 135–145)

## 2010-12-18 LAB — HEPATIC FUNCTION PANEL
Albumin: 4 g/dL (ref 3.5–5.2)
Alkaline Phosphatase: 75 U/L (ref 39–117)
Indirect Bilirubin: 0.7 mg/dL (ref 0.3–0.9)
Total Protein: 7.9 g/dL (ref 6.0–8.3)

## 2010-12-18 LAB — URINE MICROSCOPIC-ADD ON

## 2010-12-18 LAB — URINALYSIS, ROUTINE W REFLEX MICROSCOPIC
Glucose, UA: NEGATIVE mg/dL
Leukocytes, UA: NEGATIVE
Protein, ur: NEGATIVE mg/dL
pH: 6 (ref 5.0–8.0)

## 2010-12-18 LAB — LIPASE, BLOOD: Lipase: 36 U/L (ref 11–59)

## 2010-12-18 MED ORDER — ONDANSETRON HCL 4 MG/2ML IJ SOLN
4.0000 mg | Freq: Once | INTRAMUSCULAR | Status: AC
  Administered 2010-12-18: 4 mg via INTRAVENOUS
  Filled 2010-12-18: qty 2

## 2010-12-18 MED ORDER — PANTOPRAZOLE SODIUM 40 MG IV SOLR
40.0000 mg | Freq: Once | INTRAVENOUS | Status: AC
  Administered 2010-12-18: 40 mg via INTRAVENOUS
  Filled 2010-12-18: qty 40

## 2010-12-18 MED ORDER — PANTOPRAZOLE SODIUM 20 MG PO TBEC: 20.0000 mg | DELAYED_RELEASE_TABLET | Freq: Every day | ORAL | Status: DC

## 2010-12-18 MED ORDER — SODIUM CHLORIDE 0.9 % IV SOLN
Freq: Once | INTRAVENOUS | Status: AC
  Administered 2010-12-18: 15:00:00 via INTRAVENOUS

## 2010-12-18 NOTE — ED Notes (Signed)
Pt c/o abdominal pain since yesterday. States that she is spitting up clear thick liquid. Denies nausea, vomiting or diarrhea.

## 2010-12-18 NOTE — ED Notes (Signed)
Remains alert. Resp even and unlabored. Skin w/p/d. Pt comfortable at this time. Daughter at bedside. NAD noted.

## 2010-12-18 NOTE — ED Provider Notes (Signed)
History    History of present illness: Patient complains of epigastric pain for approximately 12 hours. Claims to be throwing up small amounts of thick saliva. Slight dizziness. No blood in vomitus. No change in bowel habits. Pain is described as minimal. Has not taken anything for it. CSN: [id number] Arrival date & time: 12/18/2010 12:18 PM  Chief Complaint  Patient presents with  . Abdominal Pain   Patient is a 77 y.o. female presenting with abdominal pain.  Abdominal Pain The primary symptoms of the illness include abdominal pain.    Past Medical History  Diagnosis Date  . Heart palpitations     PAC's   . Hypertension   . Anxiety   . Bladder cancer   . GERD (gastroesophageal reflux disease)   . Helicobacter pylori (H. pylori)     treated in 2009    Past Surgical History  Procedure Date  . Cholecystectomy 1998  . Bladder surgey 2009  . Cataract extraction 2 years ago    both eyes    Family History  Problem Relation Age of Onset  . Hypertension Mother   . Diabetes Mother   . Pneumonia Mother   . Diabetes Father   . Stroke Sister   . Stroke Brother   . Hypertension Daughter   . Obesity Daughter   . Diabetes Daughter     History  Substance Use Topics  . Smoking status: Never Smoker   . Smokeless tobacco: Not on file  . Alcohol Use: No    OB History    Grav Para Term Preterm Abortions TAB SAB Ect Mult Living                  Review of Systems  Gastrointestinal: Positive for abdominal pain.  All other systems reviewed and are negative.    Physical Exam  BP 138/67  Pulse 53  Temp(Src) 98.1 F (36.7 C) (Oral)  Resp 20  Ht 5\' 2"  (1.575 m)  Wt 111 lb (50.349 kg)  BMI 20.30 kg/m2  SpO2 98%  Physical Exam  Nursing note and vitals reviewed. Constitutional: She is oriented to person, place, and time. She appears well-developed and well-nourished.  HENT:  Head: Normocephalic and atraumatic.  Eyes: Conjunctivae and EOM are normal. Pupils are equal,  round, and reactive to light.  Neck: Normal range of motion. Neck supple.  Cardiovascular: Normal rate and regular rhythm.   Pulmonary/Chest: Effort normal and breath sounds normal.  Abdominal: Soft. Bowel sounds are normal.       Normal epigastric tenderness. No acute abdomen.  Musculoskeletal: Normal range of motion.  Neurological: She is alert and oriented to person, place, and time.  Skin: Skin is warm and dry.  Psychiatric: She has a normal mood and affect.    ED Course  Procedures  MDM Patient does not appear to have acute abdomen. Because of her age, will check CBC chemistry panel and lipase. We'll hydrate, Rx IV Zofran, IV Protonix. Labs reviewed with patient and daughter. She feels better after IV hydration. Able to discharge home.      Donnetta Hutching, MD 12/19/10 [phone]

## 2010-12-18 NOTE — ED Notes (Signed)
INT attempted x 1. Unsuccessful. Blood obtained.

## 2010-12-20 ENCOUNTER — Other Ambulatory Visit: Payer: Self-pay

## 2010-12-20 ENCOUNTER — Other Ambulatory Visit (INDEPENDENT_AMBULATORY_CARE_PROVIDER_SITE_OTHER): Payer: Self-pay | Admitting: Internal Medicine

## 2010-12-20 MED ORDER — AMLODIPINE BESY-BENAZEPRIL HCL 10-20 MG PO CAPS: ORAL_CAPSULE | ORAL | Status: DC

## 2010-12-22 LAB — HELICOBACTER PYLORI  SPECIAL ANTIGEN: H. PYLORI Antigen: NEGATIVE

## 2010-12-30 ENCOUNTER — Other Ambulatory Visit: Payer: Self-pay | Admitting: Family Medicine

## 2011-01-01 ENCOUNTER — Other Ambulatory Visit (INDEPENDENT_AMBULATORY_CARE_PROVIDER_SITE_OTHER): Payer: Self-pay | Admitting: *Deleted

## 2011-01-01 DIAGNOSIS — K219 Gastro-esophageal reflux disease without esophagitis: Secondary | ICD-10-CM

## 2011-01-01 MED ORDER — PANTOPRAZOLE SODIUM 40 MG PO TBEC: 40.0000 mg | DELAYED_RELEASE_TABLET | Freq: Two times a day (BID) | ORAL | Status: DC

## 2011-01-01 NOTE — Telephone Encounter (Signed)
Prescription last filled 12/30/2010

## 2011-01-02 ENCOUNTER — Encounter: Payer: Self-pay | Admitting: Family Medicine

## 2011-01-03 ENCOUNTER — Other Ambulatory Visit (HOSPITAL_COMMUNITY)
Admission: RE | Admit: 2011-01-03 | Discharge: 2011-01-03 | Disposition: A | Discharge status: Home / Self Care | Payer: Medicare Other | Source: Ambulatory Visit | Attending: Family Medicine | Admitting: Family Medicine

## 2011-01-03 ENCOUNTER — Ambulatory Visit (INDEPENDENT_AMBULATORY_CARE_PROVIDER_SITE_OTHER): Payer: Medicare Other | Admitting: Family Medicine

## 2011-01-03 ENCOUNTER — Encounter: Payer: Self-pay | Admitting: Family Medicine

## 2011-01-03 VITALS — BP 120/68 | HR 58 | Resp 16 | Ht 62.0 in | Wt 108.0 lb

## 2011-01-03 DIAGNOSIS — C679 Malignant neoplasm of bladder, unspecified: Secondary | ICD-10-CM

## 2011-01-03 DIAGNOSIS — Z0000 Encounter for general adult medical examination without abnormal findings: Secondary | ICD-10-CM

## 2011-01-03 DIAGNOSIS — K219 Gastro-esophageal reflux disease without esophagitis: Secondary | ICD-10-CM

## 2011-01-03 DIAGNOSIS — Z1211 Encounter for screening for malignant neoplasm of colon: Secondary | ICD-10-CM

## 2011-01-03 DIAGNOSIS — I10 Essential (primary) hypertension: Secondary | ICD-10-CM

## 2011-01-03 DIAGNOSIS — Z23 Encounter for immunization: Secondary | ICD-10-CM

## 2011-01-03 DIAGNOSIS — Z01419 Encounter for gynecological examination (general) (routine) without abnormal findings: Secondary | ICD-10-CM | POA: Insufficient documentation

## 2011-01-03 DIAGNOSIS — Z124 Encounter for screening for malignant neoplasm of cervix: Secondary | ICD-10-CM

## 2011-01-03 MED ORDER — PANTOPRAZOLE SODIUM 40 MG PO TBEC: 40.0000 mg | DELAYED_RELEASE_TABLET | Freq: Two times a day (BID) | ORAL | Status: DC

## 2011-01-03 MED ORDER — ATENOLOL 25 MG PO TABS: 25.0000 mg | ORAL_TABLET | Freq: Every day | ORAL | Status: DC

## 2011-01-03 NOTE — Patient Instructions (Signed)
F/u in January.  Labs , chem 7, non fasting, in January  LABWORK  NEEDS TO BE DONE BETWEEN 3 TO 7 DAYS BEFORE YOUR NEXT SCEDULED  VISIT.  THIS WILL IMPROVE THE QUALITY OF YOUR CARE.   Pneumonia vac today.  We will obtain your last mammogram from [facility].  Keep well, call if you need me  We will schedule your flu shot in Sept

## 2011-01-03 NOTE — Progress Notes (Signed)
  Subjective:    Patient ID: [name], female    DOB: 03/28/1934, 77 y.o.   MRN: [id number]  HPI The PT is here for annual exam and re-evaluation of chronic medical conditions, medication management and review of any available recent lab and radiology data.  Preventive health is updated, specifically  Cancer screening and Immunization.   Questions or concerns regarding consultations or procedures which the PT has had in the interim are  addressed. The PT denies any adverse reactions to current medications since the last visit.  There are no new concerns.  There are no specific complaints       Review of Systems See HPI Denies recent fever or chills. Denies sinus pressure, nasal congestion, ear pain or sore throat. Denies chest congestion, productive cough or wheezing. Denies chest pains, palpitations and leg swelling Denies abdominal pain, nausea, vomiting,diarrhea or constipation.   Denies dysuria, frequency, hesitancy or incontinence. Denies joint pain, swelling and limitation in mobility. Denies headaches, seizures, numbness, or tingling. Denies depression, anxiety or insomnia. Denies skin break down or rash.        Objective:   Physical Exam Pleasant well nourished female, alert and oriented x 3, in no cardio-pulmonary distress. Afebrile. HEENT No facial trauma or asymetry. Sinuses non tender.  EOMI, PERTL, fundoscopic exam is normal, no hemorhage or exudate.  External ears normal, tympanic membranes clear. Oropharynx moist, no exudate, good dentition. Neck: supple, no adenopathy,JVD or thyromegaly.No bruits.  Chest: Clear to ascultation bilaterally.No crackles or wheezes. Non tender to palpation  Breast: No asymetry,no masses. No nipple discharge or inversion. No axillary or supraclavicular adenopathy  Cardiovascular system; Heart sounds normal,  S1 and  S2 ,no S3.  No murmur, or thrill. Apical beat not displaced Peripheral pulses  normal.  Abdomen: Soft, non tender, no organomegaly or masses. No bruits. Bowel sounds normal. No guarding, tenderness or rebound.  Rectal:  No mass. Guaiac negative stool.  GU: External genitalia normal. No lesions. Vaginal canal normal.No discharge. Uterus normal size, no adnexal masses, no cervical motion or adnexal tenderness.  Musculoskeletal exam: Full ROM of spine, hips , shoulders and knees. No deformity ,swelling or crepitus noted. No muscle wasting or atrophy.   Neurologic: Cranial nerves 2 to 12 intact. Power, tone ,sensation and reflexes normal throughout. No disturbance in gait. No tremor.  Skin: Intact, no ulceration, erythema , scaling or rash noted. Pigmentation normal throughout  Psych; Normal mood and affect. Judgement and concentration normal        Assessment & Plan:

## 2011-01-03 NOTE — Assessment & Plan Note (Signed)
Controlled, no change in medication  

## 2011-01-03 NOTE — Assessment & Plan Note (Signed)
Followed by GI and reports symptom improvement on current regime

## 2011-01-03 NOTE — Assessment & Plan Note (Signed)
No evidence of current disease, has f/u appt in January

## 2011-01-11 ENCOUNTER — Telehealth (INDEPENDENT_AMBULATORY_CARE_PROVIDER_SITE_OTHER): Payer: Self-pay | Admitting: *Deleted

## 2011-01-11 NOTE — Telephone Encounter (Signed)
Patient's daughter,Angela called. She says that the Pantoprazole her Mother was given by Dr. Karilyn Cota is not working that she is having reflux. They both wanted to know if there was another medication that she may try. I spoke with Dr. Karilyn Cota and he gave verbal permission that I may give the patient samples of Dexilant 60 mg- Take 1 by mouth 30 minutes before breakfast, #15 .Lot # [id number]  Exp: APR 2014.  I called Marylene Land and she is coming to pick up samples, if this works she will contact our office and we will write a prescription for it.

## 2011-01-17 ENCOUNTER — Ambulatory Visit (INDEPENDENT_AMBULATORY_CARE_PROVIDER_SITE_OTHER): Payer: Medicare Other

## 2011-01-17 DIAGNOSIS — Z23 Encounter for immunization: Secondary | ICD-10-CM

## 2011-01-17 MED ORDER — INFLUENZA VAC TYPES A & B PF IM SUSP: 0.5000 mL | Freq: Once | INTRAMUSCULAR | Status: DC

## 2011-01-18 ENCOUNTER — Telehealth (INDEPENDENT_AMBULATORY_CARE_PROVIDER_SITE_OTHER): Payer: Self-pay | Admitting: *Deleted

## 2011-01-18 NOTE — Telephone Encounter (Signed)
Per Delrae Rend - May call in Dexilant 60 mg - Take 1 by mouth everyday - #30 - 11 refills. This was called to [facility]. I called and let Marylene Land know that we had called this in.

## 2011-01-18 NOTE — Telephone Encounter (Signed)
Brittanie Dosanjh called and states Dexilant is helping [name], so she will need RX called to [facility] please  Dexilant 60 mg daily

## 2011-01-24 ENCOUNTER — Encounter (INDEPENDENT_AMBULATORY_CARE_PROVIDER_SITE_OTHER): Payer: Self-pay | Admitting: *Deleted

## 2011-01-24 NOTE — Telephone Encounter (Signed)
LMOAM at 12:31  1.  States medication did not make it to [facility]. 2.  Now her mother doesn't think the medication is working.  Can you please call her, thanks

## 2011-01-24 NOTE — Telephone Encounter (Signed)
I called the patient 's daughter back and spoke with both she and her Mother. [name] still has pain especially at night between her breast. Her daughter gave her some Gaviscon and this helped. She had been taking Dexilant 60 mg 1 by mouth every day, and had felt that this was helping but now she is not so sure.  I told them that I would make Dr. Karilyn Cota aware and I would call them back when he addressed this.

## 2011-01-24 NOTE — Telephone Encounter (Signed)
Per Dr. Karilyn Cota - May call in Carafate 1 GM - Patient to take 1 by mouth with Evening Meal , 1 by mouth at Bedtime - 1 month with 6 refills. This was called to [facility]. Also the Dexilant that I called in the other day will need a prior authorization, samples will be given per Dr. Karilyn Cota until PA complete. If the above treatment does not help per Dr. Karilyn Cota we will do a Abdominal/Pelvic CT. I called Marylene Land and made her aware.

## 2011-01-29 LAB — URINE MICROSCOPIC-ADD ON

## 2011-01-29 LAB — BASIC METABOLIC PANEL
CO2: 29
Chloride: 94 — ABNORMAL LOW
GFR calc Af Amer: >60
Potassium: 2.9 — ABNORMAL LOW
Sodium: 130 — ABNORMAL LOW

## 2011-01-29 LAB — URINALYSIS, ROUTINE W REFLEX MICROSCOPIC
Bilirubin Urine: NEGATIVE
Glucose, UA: NEGATIVE
Protein, ur: NEGATIVE
Specific Gravity, Urine: 1.01
Urobilinogen, UA: 0.2

## 2011-01-29 LAB — CBC
Hemoglobin: 13.9
MCHC: 35.1
MCV: 92.3
RBC: 4.29
WBC: 6.7

## 2011-01-29 LAB — DIFFERENTIAL
Basophils Relative: 1
Eosinophils Absolute: 0.2
Lymphs Abs: 1.6
Monocytes Absolute: 0.9
Monocytes Relative: 13 — ABNORMAL HIGH
Neutrophils Relative %: 60

## 2011-01-29 LAB — B-NATRIURETIC PEPTIDE (CONVERTED LAB): Pro B Natriuretic peptide (BNP): <30

## 2011-01-30 ENCOUNTER — Encounter (INDEPENDENT_AMBULATORY_CARE_PROVIDER_SITE_OTHER): Payer: Self-pay | Admitting: Internal Medicine

## 2011-01-30 ENCOUNTER — Ambulatory Visit (INDEPENDENT_AMBULATORY_CARE_PROVIDER_SITE_OTHER): Payer: Medicare Other | Admitting: Internal Medicine

## 2011-01-30 DIAGNOSIS — R1013 Epigastric pain: Secondary | ICD-10-CM

## 2011-01-30 DIAGNOSIS — R634 Abnormal weight loss: Secondary | ICD-10-CM

## 2011-01-30 DIAGNOSIS — Z0189 Encounter for other specified special examinations: Secondary | ICD-10-CM

## 2011-01-30 NOTE — Progress Notes (Signed)
Presenting complaint; recurrent epigastric pain and weight loss. Subjective; patient is 77 year old African female who is here for followup for her epigastric pain. She is accompanied by her daughter today. Her last visit was on 11/27/2010. Her daughter was concerned that she may have recurrent H. pylori infection. She was advised to avoid use of Aleve. Her Hemoccult was negative. Stool antigen for H. pylori he was also negative. Her protonic dose will switch to before evening meal and at bedtime. These changes did not help her symptoms. She was urged and Dexilant.. he does not have any problems during the daytime but she has pain every night usually after 8 PM. He describes his as a sharp and burning pain. This pain is not associated with nausea vomiting or shortness of breath. It may last for several minutes. She daughter states that she does not eat well. She is helping her so she is for right ear foods as well as fruit and vegetables. She says her mother eats oatmeal at every meal but she's trying to change that habit. She has lost 5 pounds since her last visit. She says her bowels move regularly. She denies melena or rectal bleeding. She does try to walk every day. Current medications Tylenol 1 g twice a day as needed Alprazolam 1.5 mg by mouth daily Gaviscon when necessary Lotrel 1021 capsule daily Atenolol 25 mg daily Os-Cal 500/200 one tablet daily dexilant 60 mg by mouth daily Multivitamin 1 tablet daily Aleve 220 mg daily by mouth Carafate 1-2 g daily every evening Meds exam 30 mg by mouth each bedtime when necessary BP 118/72  Pulse 66  Temp(Src) 97.7 F (36.5 C) (Oral)  Resp 12  Ht 5\' 2"  (1.575 m)  Wt 107 lb (48.535 kg)  BMI 19.57 kg/m2 Patient does not appear to be in NAD Conjunctiva is pink. Sclerae nonicteric Oropharyngeal mucosa is normal No neck masses or thyromegaly noted Abdomen is flat. Bowel sounds are normal no bruits noted. On palpation soft abdomen abdomen with the  midepigastric tenderness no guarding. No organomegaly or masses Extremities up in but no clubbing noted. Assessment Chronic epigastric pain not responding to PPI therapy as well as Carafate. Very curious presentation as the pain occurs virtually every night. It is very some to know that she has lost few pounds and does not have good appetite. While she could have chronic dyspepsia need to rule out pancreatic disease or chronic gastritis. Plan Continue PPI every morning Take Carafate 2 g between 7 and 8 PM daily Abdominopelvic CT with contrast.

## 2011-01-30 NOTE — Patient Instructions (Signed)
Take sucralfate 2 gm daily between 7 and 8 pm.

## 2011-01-31 ENCOUNTER — Telehealth (INDEPENDENT_AMBULATORY_CARE_PROVIDER_SITE_OTHER): Payer: Self-pay | Admitting: *Deleted

## 2011-01-31 DIAGNOSIS — Z0189 Encounter for other specified special examinations: Secondary | ICD-10-CM

## 2011-01-31 LAB — CK: Total CK: 84 U/L (ref 7–177)

## 2011-01-31 NOTE — Telephone Encounter (Signed)
[name] from [facility] called over to request that we send her an order for a creatine to be drawn on 02-01-2011. Lab order completed and faxed to the [facility].

## 2011-02-01 ENCOUNTER — Ambulatory Visit (HOSPITAL_COMMUNITY)
Admission: RE | Admit: 2011-02-01 | Discharge: 2011-02-01 | Disposition: A | Discharge status: Home / Self Care | Payer: Medicare Other | Source: Ambulatory Visit | Attending: Internal Medicine | Admitting: Internal Medicine

## 2011-02-01 DIAGNOSIS — K5909 Other constipation: Secondary | ICD-10-CM | POA: Insufficient documentation

## 2011-02-01 DIAGNOSIS — R109 Unspecified abdominal pain: Secondary | ICD-10-CM | POA: Insufficient documentation

## 2011-02-01 DIAGNOSIS — Z8551 Personal history of malignant neoplasm of bladder: Secondary | ICD-10-CM | POA: Insufficient documentation

## 2011-02-01 DIAGNOSIS — R9389 Abnormal findings on diagnostic imaging of other specified body structures: Secondary | ICD-10-CM | POA: Insufficient documentation

## 2011-02-01 DIAGNOSIS — R1013 Epigastric pain: Secondary | ICD-10-CM

## 2011-02-01 DIAGNOSIS — R634 Abnormal weight loss: Secondary | ICD-10-CM | POA: Insufficient documentation

## 2011-02-01 LAB — CREATININE, SERUM: Creatinine, Ser: 1.08 mg/dL (ref 0.50–1.10)

## 2011-02-01 MED ORDER — IOHEXOL 300 MG/ML  SOLN
100.0000 mL | Freq: Once | INTRAMUSCULAR | Status: AC | PRN
  Administered 2011-02-01: 100 mL via INTRAVENOUS

## 2011-02-01 NOTE — Progress Notes (Signed)
I called [facility] spoke with Tresa Endo and explained that the test was ordered incorrectly, we are in process of learning new system and the wrong test was clicked on. She states that they will credit the patient but that we would be billed for this test. She did not know that the charge but was going to turn it over to billing. Merian Capron our office manager will be made aware.

## 2011-02-02 LAB — CBC
MCHC: 33.8
MCV: 95.5
Platelets: 247
RDW: 13.9

## 2011-02-02 LAB — BASIC METABOLIC PANEL
BUN: 16
CO2: 34 — ABNORMAL HIGH
Calcium: 10.1
Chloride: 99
Creatinine, Ser: 1.01

## 2011-02-02 NOTE — Progress Notes (Signed)
CT Results faxed to Dr. Lodema Hong.

## 2011-02-26 ENCOUNTER — Telehealth (INDEPENDENT_AMBULATORY_CARE_PROVIDER_SITE_OTHER): Payer: Self-pay | Admitting: *Deleted

## 2011-02-26 NOTE — Telephone Encounter (Signed)
Daughter wants to know if there is a diet sheet for her reflux or can she be referred to dietician.  We can mail this to her address at home.   [address]  [place] [address]

## 2011-02-28 NOTE — Telephone Encounter (Signed)
I faxed a copy of a GERD diet to the patient.

## 2011-03-07 ENCOUNTER — Telehealth (INDEPENDENT_AMBULATORY_CARE_PROVIDER_SITE_OTHER): Payer: Self-pay | Admitting: *Deleted

## 2011-03-07 NOTE — Telephone Encounter (Signed)
Patient presented to the office today for a weight check. Her weight was 104.7. At time of recent office visit, 01-30-11 she weighed 107 lbs. Dr. Karilyn Cota will be made aware and we will contact the patient with his recommendations.

## 2011-03-09 ENCOUNTER — Telehealth: Payer: Self-pay | Admitting: Family Medicine

## 2011-03-09 DIAGNOSIS — I10 Essential (primary) hypertension: Secondary | ICD-10-CM

## 2011-03-09 MED ORDER — ATENOLOL 25 MG PO TABS: 25.0000 mg | ORAL_TABLET | Freq: Every day | ORAL | Status: DC

## 2011-03-09 MED ORDER — ALPRAZOLAM 0.5 MG PO TABS: ORAL_TABLET | ORAL | Status: DC

## 2011-03-09 NOTE — Telephone Encounter (Signed)
Refilled to [facility] 

## 2011-03-10 NOTE — Telephone Encounter (Signed)
I talk with the patient and her daughter. Overall she's doing better. She is having less pain at night but some pain during the daytime. No real change in her appetite. She is using Gaviscon on a when necessary basis and she can also use TUMS. She has an office visit in the next 4-6 week.

## 2011-04-06 ENCOUNTER — Other Ambulatory Visit: Payer: Self-pay

## 2011-04-06 MED ORDER — AMLODIPINE BESY-BENAZEPRIL HCL 10-20 MG PO CAPS: ORAL_CAPSULE | ORAL | Status: DC

## 2011-04-13 ENCOUNTER — Encounter: Payer: Self-pay | Admitting: Cardiology

## 2011-04-17 ENCOUNTER — Encounter (INDEPENDENT_AMBULATORY_CARE_PROVIDER_SITE_OTHER): Payer: Self-pay | Admitting: Internal Medicine

## 2011-04-17 ENCOUNTER — Ambulatory Visit (INDEPENDENT_AMBULATORY_CARE_PROVIDER_SITE_OTHER): Payer: Medicare Other | Admitting: Internal Medicine

## 2011-04-17 VITALS — BP 130/70 | HR 68 | Temp 98.0°F | Resp 12 | Ht 62.0 in | Wt 108.0 lb

## 2011-04-17 DIAGNOSIS — R630 Anorexia: Secondary | ICD-10-CM

## 2011-04-17 DIAGNOSIS — R1013 Epigastric pain: Secondary | ICD-10-CM

## 2011-04-17 MED ORDER — MEGESTROL ACETATE 40 MG/ML PO SUSP: 200.0000 mg | Freq: Every day | ORAL | Status: AC

## 2011-04-17 NOTE — Patient Instructions (Signed)
Your new medication is Megace or Megestrol 200 mg or one teaspoonful daily.

## 2011-04-19 NOTE — Progress Notes (Signed)
Presenting complaint; Epigastric pain and poor appetite. Subjective: Patient is 77 year old [place] American female who is here for scheduled visit accompanied by her daughter. Just when she started to feel better she was switched to pantoprazole while waiting for Dexilant. In her epigastric pain is back worse in the evening. She also has burping and regurgitation after evening meal. She does not have a good appetite and her daughter is very concerned about her inability to gain weight. Current Medications: Current Outpatient Prescriptions on File Prior to Visit  Medication Sig Dispense Refill  . acetaminophen (TYLENOL) 500 MG tablet Take 1,000 mg by mouth 2 (two) times daily as needed. As needed       . ALPRAZolam (XANAX) 0.5 MG tablet Take 0.5 mg by mouth daily.  30 tablet  3  . aluminum hydroxide-magnesium carbonate (GAVISCON) 31.7-137 MG/5ML suspension Take 5 mLs by mouth every 6 (six) hours as needed.        Marland Kitchen amLODipine-benazepril (LOTREL) 10-20 MG per capsule One cap daily  30 capsule  3  . atenolol (TENORMIN) 25 MG tablet Take 1 tablet (25 mg total) by mouth daily.  30 tablet  3  . calcium-vitamin D (OSCAL 500/200 D-3) 500 MG tablet Take 1 tablet by mouth daily.       Marland Kitchen dexlansoprazole (DEXILANT) 60 MG capsule Take 60 mg by mouth daily.        . Multiple Vitamin (MULTIVITAMINS PO) Take by mouth daily.        . Naproxen Sodium (ALEVE PO) Take 220 mg by mouth as needed. FOR LEG PAIN      . Polyethyl Glycol-Propyl Glycol (SYSTANE ULTRA PF) 0.4-0.3 % SOLN Place 2 drops into both eyes daily as needed. FOR IRRITATION       . sucralfate (CARAFATE) 1 G tablet Take 2 g by mouth as directed. Take sucralfate between 7 and 8 pm daily.      . temazepam (RESTORIL) 30 MG capsule Take 30 mg by mouth at bedtime as needed.          Objective: BP 130/70  Pulse 68  Temp(Src) 98 F (36.7 C) (Oral)  Resp 12  Ht 5\' 2"  (1.575 m)  Wt 108 lb (48.988 kg)  BMI 19.75 kg/m2  Conjunctiva is pink. Sclera is  nonicteric Oral pharyngeal mucosa is normal. No neck masses or thyromegaly noted. Cardiac exam with regular rhythm normal S1 and S2. No murmur or gallop noted. Lungs are clear to auscultation. Abdomen is soft with mild midepigastric tenderness. No organomegaly or masses noted. No LE edema or clubbing noted.  Assessment: Epigastric pain. She developed relapse of pain on pantoprazole but she had excellent response to Dexilant. We just got this medication authorized for her. She's had reasonable workup and no need for further studies unless she does not respond this time. Anorexia and weight loss; however her weight has been stable since last visit.   Plan: Go back on Dexilant 60 mg by mouth every morning. Take Carafate in the evening as before. Megace 200 mg by mouth daily. Office visit in 2 months.

## 2011-04-24 ENCOUNTER — Other Ambulatory Visit: Payer: Self-pay

## 2011-04-24 MED ORDER — AMLODIPINE BESY-BENAZEPRIL HCL 10-20 MG PO CAPS: ORAL_CAPSULE | ORAL | Status: DC

## 2011-04-24 MED ORDER — TEMAZEPAM 30 MG PO CAPS: 30.0000 mg | ORAL_CAPSULE | Freq: Every evening | ORAL | Status: DC | PRN

## 2011-04-26 NOTE — Telephone Encounter (Signed)
This encounter was created in error - please disregard.

## 2011-04-27 ENCOUNTER — Ambulatory Visit (INDEPENDENT_AMBULATORY_CARE_PROVIDER_SITE_OTHER): Payer: Medicare Other | Admitting: Cardiology

## 2011-04-27 ENCOUNTER — Encounter: Payer: Self-pay | Admitting: Cardiology

## 2011-04-27 VITALS — BP 107/57 | HR 56 | Ht 62.0 in | Wt 105.0 lb

## 2011-04-27 DIAGNOSIS — I10 Essential (primary) hypertension: Secondary | ICD-10-CM

## 2011-04-27 DIAGNOSIS — R002 Palpitations: Secondary | ICD-10-CM

## 2011-04-27 NOTE — Assessment & Plan Note (Signed)
Well-controlled on current dose of atenolol. Cardiac testing from last year was reassuring as outlined in the prior note. No changes made today. Follow up arranged.

## 2011-04-27 NOTE — Patient Instructions (Signed)
Your physician wants you to follow-up in: 6 months. You will receive a reminder letter in the mail one-two months in advance. If you don't receive a letter, please call our office to schedule the follow-up appointment. Your physician recommends that you continue on your current medications as directed. Please refer to the Current Medication list given to you today. 

## 2011-04-27 NOTE — Progress Notes (Signed)
Clinical Summary [name] is a 77 y.o.female presenting for followup. She was seen back in December of last year. She has been followed in the interim by Dr. Lodema Hong and also Dr. Karilyn Cota.  She is here with her daughter. Reports no significant palpitations or chest pain. ECG is normal. She is tolerating Atenolol.   Allergies  Allergen Reactions  . Codeine Other (See Comments)    UNKNOWN  . Sulfonamide Derivatives Other (See Comments)    UNKNOWN    Medication list reviewed.  Past Medical History  Diagnosis Date  . Heart palpitations     PAC's   . Hypertension   . Anxiety   . Bladder cancer   . GERD (gastroesophageal reflux disease)   . Helicobacter pylori (H. pylori)     treated in 2009    Social History [name] reports that she has never smoked. She has never used smokeless tobacco. [name] reports that she does not drink alcohol.  Review of Systems Has been losing weight, reports stable appetite. Also some abdominal discomfort. No reported melena or hematochezia. No syncope.  Physical Examination Filed Vitals:   04/27/11 1053  BP: 107/57  Pulse: 56    Thin woman in no acute distress.  HEENT: Conjunctiva and lids normal, oropharynx with moist mucosa.  Neck: Supple, no bruits.  Lungs: Clear to auscultation, nonlabored.  Cardiac: Regular rate and rhythm, rare ectopic beat, no significant systolic murmur.  Extremities: No pitting edema.    Problem List and Plan

## 2011-06-04 ENCOUNTER — Encounter: Payer: Self-pay | Admitting: Family Medicine

## 2011-06-05 ENCOUNTER — Telehealth: Payer: Self-pay | Admitting: Family Medicine

## 2011-06-05 ENCOUNTER — Ambulatory Visit (INDEPENDENT_AMBULATORY_CARE_PROVIDER_SITE_OTHER): Payer: Medicare Other | Admitting: Family Medicine

## 2011-06-05 ENCOUNTER — Encounter: Payer: Self-pay | Admitting: Family Medicine

## 2011-06-05 ENCOUNTER — Other Ambulatory Visit: Payer: Self-pay | Admitting: Family Medicine

## 2011-06-05 VITALS — BP 130/68 | HR 82 | Resp 18 | Ht 61.0 in | Wt 108.0 lb

## 2011-06-05 DIAGNOSIS — E785 Hyperlipidemia, unspecified: Secondary | ICD-10-CM

## 2011-06-05 DIAGNOSIS — K219 Gastro-esophageal reflux disease without esophagitis: Secondary | ICD-10-CM

## 2011-06-05 DIAGNOSIS — E559 Vitamin D deficiency, unspecified: Secondary | ICD-10-CM

## 2011-06-05 DIAGNOSIS — G4700 Insomnia, unspecified: Secondary | ICD-10-CM

## 2011-06-05 DIAGNOSIS — R5381 Other malaise: Secondary | ICD-10-CM

## 2011-06-05 DIAGNOSIS — I10 Essential (primary) hypertension: Secondary | ICD-10-CM

## 2011-06-05 DIAGNOSIS — F411 Generalized anxiety disorder: Secondary | ICD-10-CM

## 2011-06-05 DIAGNOSIS — C679 Malignant neoplasm of bladder, unspecified: Secondary | ICD-10-CM

## 2011-06-05 DIAGNOSIS — R5383 Other fatigue: Secondary | ICD-10-CM

## 2011-06-05 NOTE — Telephone Encounter (Signed)
Patient is aware 

## 2011-06-05 NOTE — Assessment & Plan Note (Signed)
Controlled, no change in medication  

## 2011-06-05 NOTE — Assessment & Plan Note (Signed)
Dietary management only at this time

## 2011-06-05 NOTE — Assessment & Plan Note (Signed)
Controlled , no med change °

## 2011-06-05 NOTE — Assessment & Plan Note (Signed)
No med change 

## 2011-06-05 NOTE — Patient Instructions (Signed)
F/u in 4 months.  Happy belated birthday...78!  No med changes at this time.  I hope you continue to keep very well.  Pls call if you need to see me before.  Fasting labs before next visit  CBC, chem 7, lipid, tsh, vit D

## 2011-06-10 NOTE — Progress Notes (Signed)
  Subjective:    Patient ID: [name], female    DOB: 26-Sep-1933, 78 y.o.   MRN: [id number]  HPI The PT is here for follow up and re-evaluation of chronic medical conditions, medication management and review of any available recent lab and radiology data.  Preventive health is updated, specifically  Cancer screening and Immunization.   Questions or concerns regarding consultations or procedures which the PT has had in the interim are  Addressed.Currently on megace as an appetite stimuant The PT denies any adverse reactions to current medications since the last visit.  There are no new concerns.  There are no specific complaints , states she feels very well and recently celebrated her 78th birthday!      Review of Systems See HPI Denies recent fever or chills. Denies sinus pressure, nasal congestion, ear pain or sore throat. Denies chest congestion, productive cough or wheezing. Denies chest pains, palpitations and leg swelling Denies abdominal pain, nausea, vomiting,diarrhea or constipation.   Denies dysuria, frequency, hesitancy or incontinence. Denies joint pain, swelling and limitation in mobility. Denies headaches, seizures, numbness, or tingling. Denies depression, anxiety or insomnia. Denies skin break down or rash.        Objective:   Physical Exam Patient alert and oriented and in no cardiopulmonary distress.  HEENT: No facial asymmetry, EOMI, no sinus tenderness,  oropharynx pink and moist.  Neck supple no adenopathy.  Chest: Clear to auscultation bilaterally.  CVS: S1, S2 no murmurs, no S3.  ABD: Soft non tender. Bowel sounds normal.  Ext: No edema  MS: Adequate ROM spine, shoulders, hips and knees.  Skin: Intact, no ulcerations or rash noted.  Psych: Good eye contact, normal affect. Memory intact not anxious or depressed appearing.  CNS: CN 2-12 intact, power, tone and sensation normal throughout.        Assessment & Plan:

## 2011-06-10 NOTE — Assessment & Plan Note (Signed)
Treated and in remission, followed closely by urology

## 2011-06-10 NOTE — Assessment & Plan Note (Signed)
Currently controlled , and follows with GI

## 2011-07-16 ENCOUNTER — Encounter (INDEPENDENT_AMBULATORY_CARE_PROVIDER_SITE_OTHER): Payer: Self-pay | Admitting: Internal Medicine

## 2011-07-16 ENCOUNTER — Ambulatory Visit (INDEPENDENT_AMBULATORY_CARE_PROVIDER_SITE_OTHER): Payer: Medicare Other | Admitting: Internal Medicine

## 2011-07-16 VITALS — BP 140/80 | HR 76 | Temp 98.1°F | Resp 16 | Ht 62.0 in | Wt 107.7 lb

## 2011-07-16 DIAGNOSIS — R1013 Epigastric pain: Secondary | ICD-10-CM

## 2011-07-16 NOTE — Progress Notes (Signed)
Presenting complaint; Followup for epigastric pain and weight loss. Subjective: [name] is 78 year old African American female who is here for three-month followup visit to review her GI symptoms. She is accompanied by her daughter. Patient states that she feels fine. Her appetite is fair; her weight is same as on the last visit. She has had few episodes of epigastric pain relieved with Gaviscon. This pain almost always occurs in the evening and not associated with nausea or vomiting. Her bowels move regularly. She has occasional nausea but no vomiting. She takes Aleve no more than 2 doses per month.  Current Medications: Current Outpatient Prescriptions  Medication Sig Dispense Refill  . acetaminophen (TYLENOL) 500 MG tablet Take 1,000 mg by mouth 2 (two) times daily as needed. As needed       . aluminum hydroxide-magnesium carbonate (GAVISCON) 31.7-137 MG/5ML suspension Take 5 mLs by mouth every 6 (six) hours as needed.        Marland Kitchen amLODipine-benazepril (LOTREL) 10-20 MG per capsule One cap daily  30 capsule  3  . atenolol (TENORMIN) 25 MG tablet Take 1 tablet (25 mg total) by mouth daily.  30 tablet  3  . atenolol (TENORMIN) 25 MG tablet TAKE (1) TABLET BY MOUTH ONCE DAILY.  30 tablet  3  . calcium-vitamin D (OSCAL 500/200 D-3) 500 MG tablet Take 1 tablet by mouth daily.       Marland Kitchen dexlansoprazole (DEXILANT) 60 MG capsule Take 60 mg by mouth daily.        . megestrol (MEGACE) 40 MG/ML suspension Take 200 mg by mouth daily.      . Multiple Vitamin (MULTIVITAMINS PO) Take by mouth daily.        . Naproxen Sodium (ALEVE PO) Take 220 mg by mouth as needed. FOR LEG PAIN      . Polyethyl Glycol-Propyl Glycol (SYSTANE ULTRA PF) 0.4-0.3 % SOLN Place 2 drops into both eyes daily as needed. FOR IRRITATION       . sucralfate (CARAFATE) 1 G tablet Take 2 g by mouth as directed. Take sucralfate between 7 and 8 pm daily.      . temazepam (RESTORIL) 30 MG capsule Take 1 capsule (30 mg total) by mouth at bedtime as  needed.  30 capsule  1  . XANAX 0.5 MG tablet TAKE (1) TABLET BY MOUTH ONCE DAILY.  30 each  1     Objective: Blood pressure 140/80, pulse 76, temperature 98.1 F (36.7 C), temperature source Oral, resp. rate 16, height 5\' 2"  (1.575 m), weight 107 lb 11.2 oz (48.852 kg).  Conjunctiva is pink. Sclera is nonicteric Oropharyngeal mucosa is normal. No neck masses or thyromegaly noted. Cardiac exam with regular rhythm normal S1 and S2. No murmur or gallop noted. Lungs are clear to auscultation. Abdomen is flat. Bowel sounds are normal. On palpation is soft and nontender without organomegaly or masses the  No LE edema or clubbing noted.  Assessment:  Chronic epigastric pain responding to PPI and evening dose of Carafate. H. pylori gastritis was treated about 3 years ago. Weight has remained stable over the last one year. Her weight 2 years ago was 97 pounds and now she is 107.7 pounds. Unless her weight drops will not pursue with further workup. Not sure if Megace is helping.   Plan:  Continue Gaviscon,  Dexlansoprazole and sucralfate as before. Discontinue Megace when prescription runs out. Call if her weight drops by more than 5 pounds or pain recurs otherwise office visit in 6 months.

## 2011-07-16 NOTE — Patient Instructions (Addendum)
Discontinue Megace or Megestrol when prescription finished. Notify if your weight drops by more than 5 pounds or epigastric pain recurs.

## 2011-07-23 ENCOUNTER — Emergency Department (HOSPITAL_COMMUNITY)
Admission: EM | Admit: 2011-07-23 | Discharge: 2011-07-24 | Disposition: A | Discharge status: Home / Self Care | Payer: Medicare Other | Attending: Emergency Medicine | Admitting: Emergency Medicine

## 2011-07-23 ENCOUNTER — Encounter (HOSPITAL_COMMUNITY): Payer: Self-pay | Admitting: Oncology

## 2011-07-23 ENCOUNTER — Encounter (HOSPITAL_COMMUNITY): Payer: Self-pay | Admitting: *Deleted

## 2011-07-23 ENCOUNTER — Emergency Department (HOSPITAL_COMMUNITY): Payer: Medicare Other

## 2011-07-23 ENCOUNTER — Emergency Department (HOSPITAL_COMMUNITY)
Admission: EM | Admit: 2011-07-23 | Discharge: 2011-07-23 | Disposition: A | Discharge status: Home / Self Care | Payer: Medicare Other | Attending: Emergency Medicine | Admitting: Emergency Medicine

## 2011-07-23 ENCOUNTER — Other Ambulatory Visit: Payer: Self-pay

## 2011-07-23 DIAGNOSIS — R51 Headache: Secondary | ICD-10-CM | POA: Insufficient documentation

## 2011-07-23 DIAGNOSIS — R109 Unspecified abdominal pain: Secondary | ICD-10-CM | POA: Insufficient documentation

## 2011-07-23 DIAGNOSIS — R110 Nausea: Secondary | ICD-10-CM | POA: Insufficient documentation

## 2011-07-23 DIAGNOSIS — R10819 Abdominal tenderness, unspecified site: Secondary | ICD-10-CM | POA: Insufficient documentation

## 2011-07-23 DIAGNOSIS — R112 Nausea with vomiting, unspecified: Secondary | ICD-10-CM | POA: Insufficient documentation

## 2011-07-23 DIAGNOSIS — I10 Essential (primary) hypertension: Secondary | ICD-10-CM | POA: Insufficient documentation

## 2011-07-23 DIAGNOSIS — R1013 Epigastric pain: Secondary | ICD-10-CM | POA: Insufficient documentation

## 2011-07-23 DIAGNOSIS — K219 Gastro-esophageal reflux disease without esophagitis: Secondary | ICD-10-CM | POA: Insufficient documentation

## 2011-07-23 DIAGNOSIS — Z79899 Other long term (current) drug therapy: Secondary | ICD-10-CM | POA: Insufficient documentation

## 2011-07-23 DIAGNOSIS — Z8551 Personal history of malignant neoplasm of bladder: Secondary | ICD-10-CM | POA: Insufficient documentation

## 2011-07-23 LAB — COMPREHENSIVE METABOLIC PANEL
ALT: 14 U/L (ref 0–35)
AST: 22 U/L (ref 0–37)
Albumin: 3.7 g/dL (ref 3.5–5.2)
Alkaline Phosphatase: 57 U/L (ref 39–117)
CO2: 25 mEq/L (ref 19–32)
Chloride: 108 mEq/L (ref 96–112)
GFR calc non Af Amer: 55 mL/min — ABNORMAL LOW (ref >90)
Potassium: 3.8 mEq/L (ref 3.5–5.1)
Sodium: 141 mEq/L (ref 135–145)
Total Bilirubin: 0.9 mg/dL (ref 0.3–1.2)

## 2011-07-23 LAB — CBC
HCT: 38.4 % (ref 36.0–46.0)
Platelets: 264 10*3/uL (ref 150–400)
RDW: 13.8 % (ref 11.5–15.5)
WBC: 7 10*3/uL (ref 4.0–10.5)

## 2011-07-23 LAB — DIFFERENTIAL
Basophils Absolute: 0 10*3/uL (ref 0.0–0.1)
Basophils Relative: 0 % (ref 0–1)
Lymphocytes Relative: 40 % (ref 12–46)
Monocytes Absolute: 0.7 10*3/uL (ref 0.1–1.0)
Neutro Abs: 3.3 10*3/uL (ref 1.7–7.7)
Neutrophils Relative %: 48 % (ref 43–77)

## 2011-07-23 MED ORDER — SODIUM CHLORIDE 0.9 % IV BOLUS (SEPSIS)
250.0000 mL | Freq: Once | INTRAVENOUS | Status: AC
  Administered 2011-07-23: 250 mL via INTRAVENOUS

## 2011-07-23 MED ORDER — PANTOPRAZOLE SODIUM 40 MG IV SOLR
40.0000 mg | Freq: Once | INTRAVENOUS | Status: AC
  Administered 2011-07-23: 40 mg via INTRAVENOUS
  Filled 2011-07-23: qty 40

## 2011-07-23 MED ORDER — FAMOTIDINE IN NACL 20-0.9 MG/50ML-% IV SOLN
20.0000 mg | Freq: Once | INTRAVENOUS | Status: AC
  Administered 2011-07-23: 20 mg via INTRAVENOUS
  Filled 2011-07-23: qty 50

## 2011-07-23 MED ORDER — SODIUM CHLORIDE 0.9 % IV SOLN
INTRAVENOUS | Status: DC
  Administered 2011-07-23: 1000 mL via INTRAVENOUS

## 2011-07-23 MED ORDER — METOCLOPRAMIDE HCL 10 MG PO TABS: 10.0000 mg | ORAL_TABLET | Freq: Four times a day (QID) | ORAL | Status: DC | PRN

## 2011-07-23 MED ORDER — ONDANSETRON HCL 4 MG/2ML IJ SOLN
4.0000 mg | Freq: Once | INTRAMUSCULAR | Status: AC
  Administered 2011-07-23: 4 mg via INTRAVENOUS
  Filled 2011-07-23: qty 2

## 2011-07-23 MED ORDER — IOHEXOL 300 MG/ML  SOLN
100.0000 mL | Freq: Once | INTRAMUSCULAR | Status: AC | PRN
  Administered 2011-07-23: 100 mL via INTRAVENOUS

## 2011-07-23 MED ORDER — SODIUM CHLORIDE 0.9 % IV SOLN
Freq: Once | INTRAVENOUS | Status: AC
  Administered 2011-07-23: 11:00:00 via INTRAVENOUS

## 2011-07-23 MED ORDER — GI COCKTAIL ~~LOC~~
30.0000 mL | Freq: Once | ORAL | Status: AC
  Administered 2011-07-23: 30 mL via ORAL
  Filled 2011-07-23: qty 30

## 2011-07-23 NOTE — Discharge Instructions (Signed)
Followup with your gastroenterologist. Make sure you don't miss any doses of your medications. Return to the emergency department if symptoms are getting worse.  Abdominal Pain Abdominal pain can be caused by many things. Your caregiver decides the seriousness of your pain by an examination and possibly blood tests and X-rays. Many cases can be observed and treated at home. Most abdominal pain is not caused by a disease and will probably improve without treatment. However, in many cases, more time must pass before a clear cause of the pain can be found. Before that point, it may not be known if you need more testing, or if hospitalization or surgery is needed. HOME CARE INSTRUCTIONS   Do not take laxatives unless directed by your caregiver.   Take pain medicine only as directed by your caregiver.   Only take over-the-counter or prescription medicines for pain, discomfort, or fever as directed by your caregiver.   Try a clear liquid diet (broth, tea, or water) for as long as directed by your caregiver. Slowly move to a bland diet as tolerated.  SEEK IMMEDIATE MEDICAL CARE IF:   The pain does not go away.   You have a fever.   You keep throwing up (vomiting).   The pain is felt only in portions of the abdomen. Pain in the right side could possibly be appendicitis. In an adult, pain in the left lower portion of the abdomen could be colitis or diverticulitis.   You pass bloody or black tarry stools.  MAKE SURE YOU:   Understand these instructions.   Will watch your condition.   Will get help right away if you are not doing well or get worse.  Document Released: 01/31/2005 Document Revised: 04/12/2011 Document Reviewed: 12/10/2007 [facility] Patient Information 2012 [place], [place].  Nausea and Vomiting Nausea is a sick feeling that often comes before throwing up (vomiting). Vomiting is a reflex where stomach contents come out of your mouth. Vomiting can cause severe loss of body fluids  (dehydration). Children and elderly adults can become dehydrated quickly, especially if they also have diarrhea. Nausea and vomiting are symptoms of a condition or disease. It is important to find the cause of your symptoms. CAUSES   Direct irritation of the stomach lining. This irritation can result from increased acid production (gastroesophageal reflux disease), infection, food poisoning, taking certain medicines (such as nonsteroidal anti-inflammatory drugs), alcohol use, or tobacco use.   Signals from the brain.These signals could be caused by a headache, heat exposure, an inner ear disturbance, increased pressure in the brain from injury, infection, a tumor, or a concussion, pain, emotional stimulus, or metabolic problems.   An obstruction in the gastrointestinal tract (bowel obstruction).   Illnesses such as diabetes, hepatitis, gallbladder problems, appendicitis, kidney problems, cancer, sepsis, atypical symptoms of a heart attack, or eating disorders.   Medical treatments such as chemotherapy and radiation.   Receiving medicine that makes you sleep (general anesthetic) during surgery.  DIAGNOSIS Your caregiver may ask for tests to be done if the problems do not improve after a few days. Tests may also be done if symptoms are severe or if the reason for the nausea and vomiting is not clear. Tests may include:  Urine tests.   Blood tests.   Stool tests.   Cultures (to look for evidence of infection).   X-rays or other imaging studies.  Test results can help your caregiver make decisions about treatment or the need for additional tests. TREATMENT You need to stay well  hydrated. Drink frequently but in small amounts.You may wish to drink water, sports drinks, clear broth, or eat frozen ice pops or gelatin dessert to help stay hydrated.When you eat, eating slowly may help prevent nausea.There are also some antinausea medicines that may help prevent nausea. HOME CARE INSTRUCTIONS     Take all medicine as directed by your caregiver.   If you do not have an appetite, do not force yourself to eat. However, you must continue to drink fluids.   If you have an appetite, eat a normal diet unless your caregiver tells you differently.   Eat a variety of complex carbohydrates (rice, wheat, potatoes, bread), lean meats, yogurt, fruits, and vegetables.   Avoid high-fat foods because they are more difficult to digest.   Drink enough water and fluids to keep your urine clear or pale yellow.   If you are dehydrated, ask your caregiver for specific rehydration instructions. Signs of dehydration may include:   Severe thirst.   Dry lips and mouth.   Dizziness.   Dark urine.   Decreasing urine frequency and amount.   Confusion.   Rapid breathing or pulse.  SEEK IMMEDIATE MEDICAL CARE IF:   You have blood or brown flecks (like coffee grounds) in your vomit.   You have black or bloody stools.   You have a severe headache or stiff neck.   You are confused.   You have severe abdominal pain.   You have chest pain or trouble breathing.   You do not urinate at least once every 8 hours.   You develop cold or clammy skin.   You continue to vomit for longer than 24 to 48 hours.   You have a fever.  MAKE SURE YOU:   Understand these instructions.   Will watch your condition.   Will get help right away if you are not doing well or get worse.  Document Released: 04/23/2005 Document Revised: 04/12/2011 Document Reviewed: 09/20/2010 [facility] Patient Information 2012 [place], [place].  Metoclopramide tablets What is this medicine? METOCLOPRAMIDE (met oh kloe PRA mide) is used to treat the symptoms of gastroesophageal reflux disease (GERD) like heartburn. It is also used to treat people with slow emptying of the stomach and intestinal tract. This medicine may be used for other purposes; ask your health care provider or pharmacist if you have questions. What should I  tell my health care provider before I take this medicine? They need to know if you have any of these conditions: -breast cancer -depression -diabetes -heart failure -high blood pressure -kidney disease -liver disease -Parkinson's disease or a movement disorder -pheochromocytoma -seizures -stomach obstruction, bleeding, or perforation -an unusual or allergic reaction to metoclopramide, procainamide, sulfites, other medicines, foods, dyes, or preservatives -pregnant or trying to get pregnant -breast-feeding How should I use this medicine? Take this medicine by mouth with a glass of water. Follow the directions on the prescription label. Take this medicine on an empty stomach, about 30 minutes before eating. Take your doses at regular intervals. Do not take your medicine more often than directed. Do not stop taking except on the advice of your doctor or health care professional. A special MedGuide will be given to you by the pharmacist with each prescription and refill. Be sure to read this information carefully each time. Talk to your pediatrician regarding the use of this medicine in children. Special care may be needed. Overdosage: If you think you have taken too much of this medicine contact a poison control center or  emergency room at once. NOTE: This medicine is only for you. Do not share this medicine with others. What if I miss a dose? If you miss a dose, take it as soon as you can. If it is almost time for your next dose, take only that dose. Do not take double or extra doses. What may interact with this medicine? -acetaminophen -cyclosporine -digoxin -medicines for blood pressure -medicines for diabetes, including insulin -medicines for hay fever and other allergies -medicines for depression, especially an Monoamine Oxidase Inhibitor (MAOI) -medicines for Parkinson's disease, like levodopa -medicines for sleep or for pain -tetracycline This list may not describe all possible  interactions. Give your health care provider a list of all the medicines, herbs, non-prescription drugs, or dietary supplements you use. Also tell them if you smoke, drink alcohol, or use illegal drugs. Some items may interact with your medicine. What should I watch for while using this medicine? It may take a few weeks for your stomach condition to start to get better. However, do not take this medicine for longer than 12 weeks. The longer you take this medicine, and the more you take it, the greater your chances are of developing serious side effects. If you are an elderly patient, a female patient, or you have diabetes, you may be at an increased risk for side effects from this medicine. Contact your doctor immediately if you start having movements you cannot control such as lip smacking, rapid movements of the tongue, involuntary or uncontrollable movements of the eyes, head, arms and legs, or muscle twitches and spasms. Patients and their families should watch out for worsening depression or thoughts of suicide. Also watch out for any sudden or severe changes in feelings such as feeling anxious, agitated, panicky, irritable, hostile, aggressive, impulsive, severely restless, overly excited and hyperactive, or not being able to sleep. If this happens, especially at the beginning of treatment or after a change in dose, call your doctor. Do not treat yourself for high fever. Ask your doctor or health care professional for advice. You may get drowsy or dizzy. Do not drive, use machinery, or do anything that needs mental alertness until you know how this drug affects you. Do not stand or sit up quickly, especially if you are an older patient. This reduces the risk of dizzy or fainting spells. Alcohol can make you more drowsy and dizzy. Avoid alcoholic drinks. What side effects may I notice from receiving this medicine? Side effects that you should report to your doctor or health care professional as soon as  possible: -allergic reactions like skin rash, itching or hives, swelling of the face, lips, or tongue -abnormal production of milk in females -breast enlargement in both males and females -change in the way you walk -difficulty moving, speaking or swallowing -drooling, lip smacking, or rapid movements of the tongue -excessive sweating -fever -involuntary or uncontrollable movements of the eyes, head, arms and legs -irregular heartbeat or palpitations -muscle twitches and spasms -unusually weak or tired Side effects that usually do not require medical attention (report to your doctor or health care professional if they continue or are bothersome): -change in sex drive or performance -depressed mood -diarrhea -difficulty sleeping -headache -menstrual changes -restless or nervous This list may not describe all possible side effects. Call your doctor for medical advice about side effects. You may report side effects to FDA at 1-800-FDA-1088. Where should I keep my medicine? Keep out of the reach of children. Store at room temperature between 20 and  25 degrees C (68 and 77 degrees F). Protect from light. Keep container tightly closed. Throw away any unused medicine after the expiration date. NOTE: This sheet is a summary. It may not cover all possible information. If you have questions about this medicine, talk to your doctor, pharmacist, or health care provider.  2012, [employer]. (12/17/2007 4:30:05 PM)

## 2011-07-23 NOTE — ED Provider Notes (Addendum)
History   This chart was scribed for Tamara Jakes, MD by Sofie Rower. The patient was seen in room APA04/APA04 and the patient's care was started at 8:49PM.    CSN: [id number]  Arrival date & time 07/23/11  1745   First MD Initiated Contact with Patient 07/23/11 1958      Chief Complaint  Patient presents with  . Abdominal Pain    (Consider location/radiation/quality/duration/timing/severity/associated sxs/prior treatment) HPI  [name] is a 78 y.o. female who presents to the Emergency Department complaining of moderate, waxing and wanning abdominal pain located epigastrically onset today with associated symptoms of nausea, headache, high blood pressure. Pt was seen today at [facility] at 9:00AM for abd pain. Pt daughter states they performed blood work but did not complete a cat scan. Pt has a hx of hypertension.  Pt denies vomiting, having any pains like these before, fever, sore throat, cough, congestion, chest pain, neck pain or back pain, dysuria, rash, leg swelling.   PCP is Dr. Lodema Hong.   Past Medical History  Diagnosis Date  . Heart palpitations     PAC's   . Hypertension   . Anxiety   . Bladder cancer   . GERD (gastroesophageal reflux disease)   . Helicobacter pylori (H. pylori)     treated in 2009    Past Surgical History  Procedure Date  . Cholecystectomy 1998  . Bladder surgey 2009  . Cataract extraction 2 years ago    both eyes    Family History  Problem Relation Age of Onset  . Hypertension Mother   . Diabetes Mother   . Pneumonia Mother   . Diabetes Father   . Stroke Sister   . Stroke Brother   . Hypertension Daughter   . Obesity Daughter   . Diabetes Daughter     History  Substance Use Topics  . Smoking status: Never Smoker   . Smokeless tobacco: Never Used  . Alcohol Use: No    OB History    Grav Para Term Preterm Abortions TAB SAB Ect Mult Living                  Review of Systems  All other systems reviewed and are  negative.    10 Systems reviewed and are negative for acute change except as noted in the HPI.   Allergies  Codeine and Sulfonamide derivatives  Home Medications   Current Outpatient Rx  Name Route Sig Dispense Refill  . ACETAMINOPHEN 325 MG PO TABS Oral Take 325 mg by mouth every 6 (six) hours as needed. headaches    . ALUM HYDROXIDE-MAG CARBONATE 31.7-137 MG/5ML PO SUSP Oral Take 5 mLs by mouth every 6 (six) hours as needed.      Marland Kitchen AMLODIPINE BESY-BENAZEPRIL HCL 10-20 MG PO CAPS Oral Take 1 capsule by mouth every evening. One cap daily    . ATENOLOL 25 MG PO TABS Oral Take 25 mg by mouth every morning.     Marland Kitchen CALCIUM-VITAMIN D 500 MG PO TABS Oral Take 1 tablet by mouth daily.     . DEXLANSOPRAZOLE 60 MG PO CPDR Oral Take 60 mg by mouth daily.      . MEGESTROL ACETATE 40 MG/ML PO SUSP Oral Take 5 mLs by mouth daily.    Marland Kitchen METOCLOPRAMIDE HCL 10 MG PO TABS Oral Take 1 tablet (10 mg total) by mouth every 6 (six) hours as needed (nausea). 30 tablet 0  . ADULT MULTIVITAMIN W/MINERALS CH Oral Take  1 tablet by mouth daily.    Marland Kitchen NAPROXEN SODIUM 220 MG PO CAPS Oral Take 220 mg by mouth as needed. For leg pain    . POLYETHYL GLYCOL-PROPYL GLYCOL 0.4-0.3 % OP SOLN Both Eyes Place 2 drops into both eyes daily as needed. FOR IRRITATION     . SUCRALFATE 1 G PO TABS Oral Take 1 g by mouth 2 (two) times daily.     Marland Kitchen TEMAZEPAM 30 MG PO CAPS Oral Take 30 mg by mouth at bedtime as needed.    Marland Kitchen XANAX 0.5 MG PO TABS  TAKE (1) TABLET BY MOUTH ONCE DAILY. 30 each 1  . FAMOTIDINE 20 MG PO TABS Oral Take 1 tablet (20 mg total) by mouth 2 (two) times daily. 30 tablet 0    BP 141/55  Pulse 76  Temp(Src) 98.3 F (36.8 C) (Oral)  Resp 16  Ht 5\' 2"  (1.575 m)  Wt 107 lb (48.535 kg)  BMI 19.57 kg/m2  SpO2 100%  Physical Exam  Nursing note and vitals reviewed. Constitutional: She is oriented to person, place, and time. She appears well-developed.  HENT:  Head: Normocephalic and atraumatic.  Right Ear:  External ear normal.  Left Ear: External ear normal.  Nose: Nose normal.  Eyes: Conjunctivae and EOM are normal. Pupils are equal, round, and reactive to light. No scleral icterus.  Neck: Neck supple. No thyromegaly present.  Cardiovascular: Normal rate, regular rhythm and normal heart sounds.  Exam reveals no gallop and no friction rub.   No murmur heard. Pulmonary/Chest: Breath sounds normal. No stridor. She has no wheezes. She has no rales. She exhibits no tenderness.  Abdominal: Bowel sounds are normal. She exhibits no distension. There is tenderness (Epigastric.). There is no rebound and no guarding.  Musculoskeletal: Normal range of motion. She exhibits no edema.  Lymphadenopathy:    She has no cervical adenopathy.  Neurological: She is alert and oriented to person, place, and time. Coordination normal.  Skin: Skin is warm and dry. No rash noted. No erythema.  Psychiatric: She has a normal mood and affect. Her behavior is normal.    ED Course  Procedures (including critical care time)  DIAGNOSTIC STUDIES: Oxygen Saturation is 100% on room air, normal by my interpretation.    COORDINATION OF CARE:   Results for orders placed during the hospital encounter of 07/23/11  TROPONIN I      Component Value Range   Troponin I <0.30  <0.30 (ng/mL)   Dg Chest 2 View  07/23/2011  *RADIOLOGY REPORT*  Clinical Data: Abdominal pain and weakness.  CHEST - 2 VIEW  Comparison: Chest radiograph performed 04/02/2010  Findings: The lungs are well-aerated and clear.  There is no evidence of focal opacification, pleural effusion or pneumothorax.  The heart is normal in size; calcification is noted in the aortic arch.  No acute osseous abnormalities are seen.  Clips are noted within the right upper quadrant, reflecting prior cholecystectomy.  IMPRESSION: No acute cardiopulmonary process seen.  Original Report Authenticated By: Tonia Ghent, M.D.   Ct Abdomen Pelvis W Contrast  07/23/2011  *RADIOLOGY  REPORT*  Clinical Data: Epigastric abdominal pain and nausea.  CT ABDOMEN AND PELVIS WITH CONTRAST  Technique:  Multidetector CT imaging of the abdomen and pelvis was performed following the standard protocol during bolus administration of intravenous contrast.  Contrast: OMNIPAQUE IOHEXOL 300 MG/ML IJ SOLN  Comparison: CT of the abdomen and pelvis performed 02/01/2011  Findings: The visualized lung bases are clear.  There is a tiny herniation of fat above the right hemidiaphragm.  There is mild apparent wall thickening at the distal esophagus; this is nonspecific in appearance and likely reflects normal slight tortuosity of the distal esophagus, though if there is significant clinical concern for a small mass, endoscopy could be considered for further evaluation.  The liver and spleen are unremarkable in appearance.  The patient is status post cholecystectomy; the common hepatic duct measures 1.1 cm, within normal limits status post cholecystectomy.  Clips are noted at the gallbladder fossa.  The pancreas and adrenal glands are unremarkable in appearance.  Scattered small bilateral renal cysts are seen, measuring up to 1.6 cm in size.  The kidneys are otherwise unremarkable in appearance. There is no evidence of hydronephrosis.  No renal or ureteral stones are seen.  No significant perinephric stranding is appreciated.  No free fluid is identified.  The small bowel is unremarkable in appearance.  The stomach is filled with contrast and is within normal limits.  No acute vascular abnormalities are seen.  Diffuse calcification is noted along the abdominal aorta and its branches.  The appendix is normal in caliber and contains air, without evidence for appendicitis.  The colon is partially filled with stool and is unremarkable in appearance.  The bladder is mildly distended and grossly unremarkable in appearance.  The uterus is within normal limits.  A 3.7 cm right adnexal cystic lesion appears essentially  stable from 2012, though slightly enlarged from 2009.  As before, pelvic ultrasound could be considered for further evaluation, on an elective non-emergent basis, when and as deemed clinically appropriate.  No inguinal lymphadenopathy is seen.  No acute osseous abnormalities are identified.  There is grade 1 anterolisthesis of L4 on L5, reflecting facet disease.  IMPRESSION:  1.  Mild apparent wall thickening at the distal esophagus; this is nonspecific in appearance and likely reflects normal slight tortuosity of the distal esophagus, though if there is significant clinical concern for a small mass, endoscopy could be considered for further evaluation. 2.  Scattered small bilateral renal cysts seen. 3.  Diffuse calcification along the abdominal aorta and its branches. 4.  3.7 cm right adnexal cystic lesion appears essentially stable from 2012, though slightly enlarged from 2009.  Though this is likely benign, persistence of an ovarian cystic lesion is unusual, and pelvic ultrasound could be considered for further evaluation, on an elective non-emergent basis, when and as deemed clinically appropriate.  Original Report Authenticated By: Tonia Ghent, M.D.     Date: 07/24/2011  Rate: 86  Rhythm: normal sinus rhythm and premature ventricular contractions (PVC)  QRS Axis: normal  Intervals: normal  ST/T Wave abnormalities: normal  Conduction Disutrbances:none  Narrative Interpretation:   Old EKG Reviewed: unchanged No significant change in EKG from 04/04/2010    Labs Reviewed  TROPONIN I   Dg Chest 2 View  07/23/2011  *RADIOLOGY REPORT*  Clinical Data: Abdominal pain and weakness.  CHEST - 2 VIEW  Comparison: Chest radiograph performed 04/02/2010  Findings: The lungs are well-aerated and clear.  There is no evidence of focal opacification, pleural effusion or pneumothorax.  The heart is normal in size; calcification is noted in the aortic arch.  No acute osseous abnormalities are seen.  Clips are  noted within the right upper quadrant, reflecting prior cholecystectomy.  IMPRESSION: No acute cardiopulmonary process seen.  Original Report Authenticated By: Tonia Ghent, M.D.   Ct Abdomen Pelvis W Contrast  07/23/2011  *RADIOLOGY REPORT*  Clinical Data: Epigastric  abdominal pain and nausea.  CT ABDOMEN AND PELVIS WITH CONTRAST  Technique:  Multidetector CT imaging of the abdomen and pelvis was performed following the standard protocol during bolus administration of intravenous contrast.  Contrast: OMNIPAQUE IOHEXOL 300 MG/ML IJ SOLN  Comparison: CT of the abdomen and pelvis performed 02/01/2011  Findings: The visualized lung bases are clear.  There is a tiny herniation of fat above the right hemidiaphragm.  There is mild apparent wall thickening at the distal esophagus; this is nonspecific in appearance and likely reflects normal slight tortuosity of the distal esophagus, though if there is significant clinical concern for a small mass, endoscopy could be considered for further evaluation.  The liver and spleen are unremarkable in appearance.  The patient is status post cholecystectomy; the common hepatic duct measures 1.1 cm, within normal limits status post cholecystectomy.  Clips are noted at the gallbladder fossa.  The pancreas and adrenal glands are unremarkable in appearance.  Scattered small bilateral renal cysts are seen, measuring up to 1.6 cm in size.  The kidneys are otherwise unremarkable in appearance. There is no evidence of hydronephrosis.  No renal or ureteral stones are seen.  No significant perinephric stranding is appreciated.  No free fluid is identified.  The small bowel is unremarkable in appearance.  The stomach is filled with contrast and is within normal limits.  No acute vascular abnormalities are seen.  Diffuse calcification is noted along the abdominal aorta and its branches.  The appendix is normal in caliber and contains air, without evidence for appendicitis.  The colon  is partially filled with stool and is unremarkable in appearance.  The bladder is mildly distended and grossly unremarkable in appearance.  The uterus is within normal limits.  A 3.7 cm right adnexal cystic lesion appears essentially stable from 2012, though slightly enlarged from 2009.  As before, pelvic ultrasound could be considered for further evaluation, on an elective non-emergent basis, when and as deemed clinically appropriate.  No inguinal lymphadenopathy is seen.  No acute osseous abnormalities are identified.  There is grade 1 anterolisthesis of L4 on L5, reflecting facet disease.  IMPRESSION:  1.  Mild apparent wall thickening at the distal esophagus; this is nonspecific in appearance and likely reflects normal slight tortuosity of the distal esophagus, though if there is significant clinical concern for a small mass, endoscopy could be considered for further evaluation. 2.  Scattered small bilateral renal cysts seen. 3.  Diffuse calcification along the abdominal aorta and its branches. 4.  3.7 cm right adnexal cystic lesion appears essentially stable from 2012, though slightly enlarged from 2009.  Though this is likely benign, persistence of an ovarian cystic lesion is unusual, and pelvic ultrasound could be considered for further evaluation, on an elective non-emergent basis, when and as deemed clinically appropriate.  Original Report Authenticated By: Tonia Ghent, M.D.     1. Abdominal pain, acute, epigastric     8:50PM- EDP at bedside discusses treatment plan concerning cat scan.   MDM   Workup in the emergency department with a CAT scan without any specific findings other than some of the swelling around the distal esophagus which could just be torturous also could represent a small mass will treat with Pepcid to see if that has any benefit patient has referral to GI medicine and can followup with her primary care doctor as well. No acute surgical findings no significant abnormalities  cardiac workup was negative as well.  Addendum: Patient already followed by Dr.  Raman for the referral to GI medicine will be to him. Patient already on 3 reflux type of medications however the Pepcid helped her tremendously here in the emergency department prescription has been sent electronically to her pharmacist have requested to the daughter talked to her GI doctor before starting her on Pepcid. They should be able to do that tomorrow morning.   I personally performed the services described in this documentation, which was scribed in my presence. The recorded information has been reviewed and considered.     Tamara Jakes, MD 07/24/11 1610  Tamara Jakes, MD 07/27/11 [phone]

## 2011-07-23 NOTE — ED Provider Notes (Signed)
History   This chart was scribed for Dione Booze, MD by Clarita Crane. The patient was seen in room APA14/APA14. Patient's care was started at 0908.    CSN: [id number]  Arrival date & time 07/23/11  0908   First MD Initiated Contact with Patient 07/23/11 [phone]      Chief Complaint  Patient presents with  . Abdominal Pain  . Epigastric pain     (Consider location/radiation/quality/duration/timing/severity/associated sxs/prior treatment) HPI [name] is a 78 y.o. female who presents to the Emergency Department complaining of waxing and waning epigastric abdominal pain onset yesterday and persistent since with associated nausea and vomiting. Patient rates epigastric pain a 10 out of 10 currently. Notes pain is aggravated by nothing and was relieved following BM. Reports last BM occurred this morning. Denies fever, chills diaphoresis, constipation. Patient with h/o palpitations, HTN, anxiety, bladder CA, GERD, H. Pylori, cholecystectomy.  Past Medical History  Diagnosis Date  . Heart palpitations     PAC's   . Hypertension   . Anxiety   . Bladder cancer   . GERD (gastroesophageal reflux disease)   . Helicobacter pylori (H. pylori)     treated in 2009    Past Surgical History  Procedure Date  . Cholecystectomy 1998  . Bladder surgey 2009  . Cataract extraction 2 years ago    both eyes    Family History  Problem Relation Age of Onset  . Hypertension Mother   . Diabetes Mother   . Pneumonia Mother   . Diabetes Father   . Stroke Sister   . Stroke Brother   . Hypertension Daughter   . Obesity Daughter   . Diabetes Daughter     History  Substance Use Topics  . Smoking status: Never Smoker   . Smokeless tobacco: Never Used  . Alcohol Use: No    OB History    Grav Para Term Preterm Abortions TAB SAB Ect Mult Living                  Review of Systems  Constitutional: Negative for fever, chills and diaphoresis.  Respiratory: Negative for shortness of  breath.   Cardiovascular: Negative for chest pain.  Gastrointestinal: Positive for nausea, vomiting and abdominal pain. Negative for diarrhea and constipation.  All other systems reviewed and are negative.    Allergies  Codeine and Sulfonamide derivatives  Home Medications   Current Outpatient Rx  Name Route Sig Dispense Refill  . ACETAMINOPHEN 325 MG PO TABS Oral Take 325 mg by mouth every 6 (six) hours as needed. headaches    . ALUM HYDROXIDE-MAG CARBONATE 31.7-137 MG/5ML PO SUSP Oral Take 5 mLs by mouth every 6 (six) hours as needed.      Marland Kitchen AMLODIPINE BESY-BENAZEPRIL HCL 10-20 MG PO CAPS  One cap daily 30 capsule 3  . ATENOLOL 25 MG PO TABS      . CALCIUM-VITAMIN D 500 MG PO TABS Oral Take 1 tablet by mouth daily.     . DEXLANSOPRAZOLE 60 MG PO CPDR Oral Take 60 mg by mouth daily.      . MEGESTROL ACETATE 40 MG/ML PO SUSP Oral Take 5 mLs by mouth daily.    . MULTIVITAMINS PO Oral Take by mouth daily.      . ALEVE PO Oral Take 220 mg by mouth as needed. FOR LEG PAIN    . POLYETHYL GLYCOL-PROPYL GLYCOL 0.4-0.3 % OP SOLN Both Eyes Place 2 drops into both eyes daily as needed. FOR  IRRITATION     . SUCRALFATE 1 G PO TABS Oral Take 1 g by mouth 2 (two) times daily.     Marland Kitchen TEMAZEPAM 30 MG PO CAPS Oral Take 30 mg by mouth at bedtime as needed.    Marland Kitchen XANAX 0.5 MG PO TABS  TAKE (1) TABLET BY MOUTH ONCE DAILY. 30 each 1    BP 132/67  Pulse 86  Temp(Src) 98.4 F (36.9 C) (Oral)  Resp 18  Ht 5\' 2"  (1.575 m)  Wt 107 lb (48.535 kg)  BMI 19.57 kg/m2  SpO2 98%  Physical Exam  Nursing note and vitals reviewed. Constitutional: She is oriented to person, place, and time. She appears well-developed and well-nourished. No distress.  HENT:  Head: Normocephalic and atraumatic.  Eyes: EOM are normal. Pupils are equal, round, and reactive to light.  Neck: Neck supple. No tracheal deviation present.  Cardiovascular: Normal rate and regular rhythm.  Exam reveals no gallop and no friction rub.     No murmur heard. Pulmonary/Chest: Effort normal. No respiratory distress. She has no wheezes. She has no rales.  Abdominal: Soft. She exhibits no distension. There is tenderness. There is no rebound and no guarding.       Tender to palpation across upper abdomen and left side of abdomen. Bowel sounds diminished.   Musculoskeletal: Normal range of motion. She exhibits no edema.  Neurological: She is alert and oriented to person, place, and time. No sensory deficit.  Skin: Skin is warm and dry.  Psychiatric: She has a normal mood and affect. Her behavior is normal.    ED Course  Procedures (including critical care time)  DIAGNOSTIC STUDIES: Oxygen Saturation is 98% on room air, normal by my interpretation.    COORDINATION OF CARE: 10:11AM- Patient informed of current plan for treatment and evaluation and agrees with plan at this time.  12:04PM- Patient informed of lab results. Patient states she is feeling better at this time and states she feels capable of going home with Rx for antiemetics. Will d/c home.    Results for orders placed during the hospital encounter of 07/23/11  CBC      Component Value Range   WBC 7.0  4.0 - 10.5 (K/uL)   RBC 4.13  3.87 - 5.11 (MIL/uL)   Hemoglobin 13.4  12.0 - 15.0 (g/dL)   HCT 78.2  95.6 - 21.3 (%)   MCV 93.0  78.0 - 100.0 (fL)   MCH 32.4  26.0 - 34.0 (pg)   MCHC 34.9  30.0 - 36.0 (g/dL)   RDW 08.6  57.8 - 46.9 (%)   Platelets 264  150 - 400 (K/uL)  DIFFERENTIAL      Component Value Range   Neutrophils Relative 48  43 - 77 (%)   Neutro Abs 3.3  1.7 - 7.7 (K/uL)   Lymphocytes Relative 40  12 - 46 (%)   Lymphs Abs 2.8  0.7 - 4.0 (K/uL)   Monocytes Relative 10  3 - 12 (%)   Monocytes Absolute 0.7  0.1 - 1.0 (K/uL)   Eosinophils Relative 2  0 - 5 (%)   Eosinophils Absolute 0.1  0.0 - 0.7 (K/uL)   Basophils Relative 0  0 - 1 (%)   Basophils Absolute 0.0  0.0 - 0.1 (K/uL)  COMPREHENSIVE METABOLIC PANEL      Component Value Range   Sodium  141  135 - 145 (mEq/L)   Potassium 3.8  3.5 - 5.1 (mEq/L)   Chloride 108  96 - 112 (mEq/L)   CO2 25  19 - 32 (mEq/L)   Glucose, Bld 98  70 - 99 (mg/dL)   BUN 13  6 - 23 (mg/dL)   Creatinine, Ser 1.61  0.50 - 1.10 (mg/dL)   Calcium 9.7  8.4 - 09.6 (mg/dL)   Total Protein 7.0  6.0 - 8.3 (g/dL)   Albumin 3.7  3.5 - 5.2 (g/dL)   AST 22  0 - 37 (U/L)   ALT 14  0 - 35 (U/L)   Alkaline Phosphatase 57  39 - 117 (U/L)   Total Bilirubin 0.9  0.3 - 1.2 (mg/dL)   GFR calc non Af Amer 55 (*) >90 (mL/min)   GFR calc Af Amer 64 (*) >90 (mL/min)  LIPASE, BLOOD      Component Value Range   Lipase 44  11 - 59 (U/L)     1. Abdominal pain   2. Nausea and vomiting       MDM  Abdominal pain most likely secondary to GERD possibly related to missing medication doses. Workup has been ordered.     I personally performed the services described in this documentation, which was scribed in my presence. The recorded information has been reviewed and considered.       Dione Booze, MD 07/23/11 [phone]

## 2011-07-23 NOTE — ED Notes (Signed)
[name] reports 2 days of abdominal pain, epigastric region w/ vomiting episode x 1 yesterday.  Pt denies fevers or diarrhea.

## 2011-07-23 NOTE — ED Notes (Signed)
Pt reports epigastric pain for the last few days. Denies anything making better or worse. Denies vomiting or diarrhea.

## 2011-07-23 NOTE — ED Notes (Signed)
Seen this am for abdominal pain, states she is worse

## 2011-07-24 MED ORDER — FAMOTIDINE 20 MG PO TABS: 20.0000 mg | ORAL_TABLET | Freq: Two times a day (BID) | ORAL | Status: DC

## 2011-07-24 NOTE — Discharge Instructions (Signed)
CT scan of abdomen without significant findings other than some swelling of the distal esophagus this will require further workup by GI medicine. Take Pepcid for the next several days until gone follow back up with your primary care Dr. next 2 days call GI medicine for appointment for endoscopy. Return for new or worse symptoms.

## 2011-07-24 NOTE — ED Notes (Signed)
Pt alert & oriented x4, stable gait. Pt given discharge instructions, paperwork & prescription(s). Patient instructed to stop at the registration desk to finish any additional paperwork. pt verbalized understanding. Pt left department w/ no further questions.  

## 2011-07-25 ENCOUNTER — Telehealth (INDEPENDENT_AMBULATORY_CARE_PROVIDER_SITE_OTHER): Payer: Self-pay | Admitting: *Deleted

## 2011-07-25 NOTE — Telephone Encounter (Signed)
On 03-18 I rec'd a voicemail from Marylene Land in regard to her Mother , [name]. She was seen in the ED for a 2nd time, while in the Ed she was given Pepcid and the Physician ask that they call Dr. Karilyn Cota and address all the patient's meds as she is on several for the reflux. Angela's question is how should they precede with the other meds.  I have tried several times to call her. When I call the home or Angela's phone , I get messages such as Voicemail service has not been established.  I am trying to find out more specifics so that I may get this addressed with Dr. Karilyn Cota.

## 2011-07-26 NOTE — Telephone Encounter (Signed)
I spoke with [name] this morning. She states that her Mother was given Pepcid IV at one of the ED visits earlier this week and this helped, so the ED Physician wrote a prescription for Pepcid 20 mg Take one by mouth twice daily. They picked up the prescription but she has not started the medication due to concerns about the other medications that she is already taking prescribed by Dr. Karilyn Cota for reflux. Her question is which should she take? I told [name] that I would address with Dr.Rehman and call her back. I explained the messages that I was getting when trying to call her Mother or her Cell Phone, she ask that if I could not reach her on her Cell to call her sister's Cell [name]) [phone].

## 2011-07-26 NOTE — Telephone Encounter (Signed)
[name] was called and made aware of Dr. Patty Sermons recommendation. She states that she will call us in about 2 weeks and let us know how she has done on the pills , and if they will need a prescription for more.

## 2011-07-26 NOTE — Telephone Encounter (Signed)
Patient can stop the Dexilant and try Pepcid 20 mg twice daily. She should continue sucralfate in the evening

## 2011-08-06 ENCOUNTER — Other Ambulatory Visit: Payer: Self-pay | Admitting: Family Medicine

## 2011-08-06 ENCOUNTER — Encounter: Payer: Self-pay | Admitting: Family Medicine

## 2011-08-11 ENCOUNTER — Emergency Department (HOSPITAL_COMMUNITY): Payer: Medicare Other

## 2011-08-11 ENCOUNTER — Encounter (HOSPITAL_COMMUNITY): Payer: Self-pay | Admitting: Emergency Medicine

## 2011-08-11 ENCOUNTER — Emergency Department (HOSPITAL_COMMUNITY)
Admission: EM | Admit: 2011-08-11 | Discharge: 2011-08-12 | Disposition: A | Discharge status: Home / Self Care | Payer: Medicare Other | Attending: Emergency Medicine | Admitting: Emergency Medicine

## 2011-08-11 ENCOUNTER — Other Ambulatory Visit: Payer: Self-pay

## 2011-08-11 DIAGNOSIS — F411 Generalized anxiety disorder: Secondary | ICD-10-CM | POA: Insufficient documentation

## 2011-08-11 DIAGNOSIS — I10 Essential (primary) hypertension: Secondary | ICD-10-CM | POA: Insufficient documentation

## 2011-08-11 DIAGNOSIS — R079 Chest pain, unspecified: Secondary | ICD-10-CM | POA: Insufficient documentation

## 2011-08-11 DIAGNOSIS — Z79899 Other long term (current) drug therapy: Secondary | ICD-10-CM | POA: Insufficient documentation

## 2011-08-11 DIAGNOSIS — R112 Nausea with vomiting, unspecified: Secondary | ICD-10-CM | POA: Insufficient documentation

## 2011-08-11 DIAGNOSIS — R011 Cardiac murmur, unspecified: Secondary | ICD-10-CM | POA: Insufficient documentation

## 2011-08-11 DIAGNOSIS — R002 Palpitations: Secondary | ICD-10-CM | POA: Insufficient documentation

## 2011-08-11 LAB — COMPREHENSIVE METABOLIC PANEL
AST: 20 U/L (ref 0–37)
Albumin: 3.7 g/dL (ref 3.5–5.2)
BUN: 12 mg/dL (ref 6–23)
Chloride: 103 mEq/L (ref 96–112)
Creatinine, Ser: 0.86 mg/dL (ref 0.50–1.10)
Potassium: 3.5 mEq/L (ref 3.5–5.1)
Total Bilirubin: 0.7 mg/dL (ref 0.3–1.2)
Total Protein: 6.9 g/dL (ref 6.0–8.3)

## 2011-08-11 LAB — CBC
Hemoglobin: 12.6 g/dL (ref 12.0–15.0)
MCH: 32.1 pg (ref 26.0–34.0)
MCHC: 34.3 g/dL (ref 30.0–36.0)
RDW: 13 % (ref 11.5–15.5)

## 2011-08-11 LAB — URINALYSIS, ROUTINE W REFLEX MICROSCOPIC
Leukocytes, UA: NEGATIVE
Nitrite: NEGATIVE
Specific Gravity, Urine: 1.01 (ref 1.005–1.030)
Urobilinogen, UA: 0.2 mg/dL (ref 0.0–1.0)
pH: 7 (ref 5.0–8.0)

## 2011-08-11 LAB — DIFFERENTIAL
Basophils Absolute: 0 10*3/uL (ref 0.0–0.1)
Basophils Relative: 0 % (ref 0–1)
Eosinophils Absolute: 0.2 10*3/uL (ref 0.0–0.7)
Monocytes Absolute: 0.8 10*3/uL (ref 0.1–1.0)
Monocytes Relative: 12 % (ref 3–12)
Neutro Abs: 3.7 10*3/uL (ref 1.7–7.7)
Neutrophils Relative %: 52 % (ref 43–77)

## 2011-08-11 LAB — TROPONIN I
Troponin I: <0.3 ng/mL (ref <0.30)
Troponin I: <0.3 ng/mL (ref <0.30)

## 2011-08-11 LAB — D-DIMER, QUANTITATIVE: D-Dimer, Quant: 1.09 ug/mL-FEU — ABNORMAL HIGH (ref 0.00–0.48)

## 2011-08-11 MED ORDER — IOHEXOL 350 MG/ML SOLN
100.0000 mL | Freq: Once | INTRAVENOUS | Status: AC | PRN
  Administered 2011-08-11: 100 mL via INTRAVENOUS

## 2011-08-11 MED ORDER — GI COCKTAIL ~~LOC~~
30.0000 mL | Freq: Once | ORAL | Status: AC
  Administered 2011-08-11: 30 mL via ORAL
  Filled 2011-08-11: qty 30

## 2011-08-11 NOTE — ED Provider Notes (Signed)
History    Scribed for Glynn Octave, MD, the patient was seen in room APA01/APA01. This chart was scribed by Katha Cabal.   CSN: [id number]  Arrival date & time 08/11/11  2026   First MD Initiated Contact with Patient 08/11/11 2051      Chief Complaint  Patient presents with  . Tachycardia    (Consider location/radiation/quality/duration/timing/severity/associated sxs/prior treatment) HPI  Pt was seen at 8:57 PM   [name] is a 78 y.o. female who presents to the Emergency Department complaining of fast palpitations and chest pain while sitting at home.  Patient also reports chest pain since onset of palpitations this AM.  Chest pain all day that comes and goes.  Episodes of chest pain last about 30 minutes.  Pain does not radiate to back.  Patient vomited about 4 times.  Symptoms are not associated with abdominal pain, shortness of breath, leg swelling.  Pain worse with palpation of chest.  Patient with history of hypertension, anxiety and GERD.  Denies history of myocardial infarction.      PCP Syliva Overman, MD, MD      Past Medical History  Diagnosis Date  . Heart palpitations     PAC's   . Hypertension   . Anxiety   . Bladder cancer   . GERD (gastroesophageal reflux disease)   . Helicobacter pylori (H. pylori)     treated in 2009    Past Surgical History  Procedure Date  . Cholecystectomy 1998  . Bladder surgey 2009  . Cataract extraction 2 years ago    both eyes    Family History  Problem Relation Age of Onset  . Hypertension Mother   . Diabetes Mother   . Pneumonia Mother   . Diabetes Father   . Stroke Sister   . Stroke Brother   . Hypertension Daughter   . Obesity Daughter   . Diabetes Daughter     History  Substance Use Topics  . Smoking status: Never Smoker   . Smokeless tobacco: Never Used  . Alcohol Use: No    OB History    Grav Para Term Preterm Abortions TAB SAB Ect Mult Living                  Review of Systems    All other systems reviewed and are negative.    Allergies  Codeine and Sulfonamide derivatives  Home Medications   Current Outpatient Rx  Name Route Sig Dispense Refill  . ACETAMINOPHEN 325 MG PO TABS Oral Take 325 mg by mouth every 6 (six) hours as needed. headaches    . ALUM HYDROXIDE-MAG CARBONATE 31.7-137 MG/5ML PO SUSP Oral Take 5 mLs by mouth every 6 (six) hours as needed.      Marland Kitchen AMLODIPINE BESY-BENAZEPRIL HCL 10-20 MG PO CAPS Oral Take 1 capsule by mouth every evening. One cap daily    . ATENOLOL 25 MG PO TABS Oral Take 25 mg by mouth every morning.     Marland Kitchen CALCIUM-VITAMIN D 500 MG PO TABS Oral Take 1 tablet by mouth daily.     . DEXLANSOPRAZOLE 60 MG PO CPDR Oral Take 60 mg by mouth daily.      Marland Kitchen FAMOTIDINE 20 MG PO TABS Oral Take 1 tablet (20 mg total) by mouth 2 (two) times daily. 30 tablet 0  . MEGESTROL ACETATE 40 MG/ML PO SUSP Oral Take 5 mLs by mouth daily.    . ADULT MULTIVITAMIN W/MINERALS CH Oral Take 1 tablet by  mouth daily.    Marland Kitchen NAPROXEN SODIUM 220 MG PO CAPS Oral Take 220 mg by mouth as needed. For leg pain    . POLYETHYL GLYCOL-PROPYL GLYCOL 0.4-0.3 % OP SOLN Both Eyes Place 2 drops into both eyes daily as needed. FOR IRRITATION     . RESTORIL 30 MG PO CAPS  TAKE 1 CAPSULE BY MOUTH AT BEDTIME. 30 each 2  . SUCRALFATE 1 G PO TABS Oral Take 1 g by mouth 2 (two) times daily.     Marland Kitchen XANAX 0.5 MG PO TABS  TAKE (1) TABLET BY MOUTH ONCE DAILY. 30 each 1    BP 107/38  Pulse 108  Temp(Src) 98.8 F (37.1 C) (Oral)  Resp 20  Ht 5\' 2"  (1.575 m)  Wt 105 lb (47.628 kg)  BMI 19.20 kg/m2  SpO2 99%  Physical Exam  Nursing note and vitals reviewed. Constitutional: She is oriented to person, place, and time. She appears well-developed. No distress.  HENT:  Head: Normocephalic and atraumatic.  Eyes: Conjunctivae and EOM are normal.  Neck: Neck supple.  Cardiovascular: Normal rate and regular rhythm.   Murmur heard.      holosystolic murmur 3/6  Pulmonary/Chest: Effort  normal and breath sounds normal. No respiratory distress. She exhibits tenderness.       Reproducible chest tenderness   Abdominal: Soft. There is no tenderness. There is no rebound and no guarding.  Musculoskeletal: Normal range of motion. She exhibits no edema.  Neurological: She is alert and oriented to person, place, and time. No sensory deficit.  Skin: Skin is warm and dry. No rash noted.  Psychiatric: Her speech is normal and behavior is normal.       Flat affect     ED Course  Procedures (including critical care time)   DIAGNOSTIC STUDIES: Oxygen Saturation is 99% on room air, normal by my interpretation.     COORDINATION OF CARE: 8:59 PM  Physical exam complete.   9:15 PM  GI Cocktail ordered.     LABS / RADIOLOGY:   Labs Reviewed  COMPREHENSIVE METABOLIC PANEL - Abnormal; Notable for the following:    Glucose, Bld 159 (*)    GFR calc non Af Amer 64 (*)    GFR calc Af Amer 74 (*)    All other components within normal limits  D-DIMER, QUANTITATIVE - Abnormal; Notable for the following:    D-Dimer, Quant 1.09 (*)    All other components within normal limits  URINALYSIS, ROUTINE W REFLEX MICROSCOPIC - Abnormal; Notable for the following:    Hgb urine dipstick SMALL (*)    All other components within normal limits  URINE MICROSCOPIC-ADD ON - Abnormal; Notable for the following:    Bacteria, UA FEW (*)    All other components within normal limits  CBC  DIFFERENTIAL  TROPONIN I  TROPONIN I  TSH  T4, FREE   Dg Chest 2 View  08/11/2011  *RADIOLOGY REPORT*  Clinical Data: Chest pain, confusion  CHEST - 2 VIEW  Comparison: 07/23/2011  Findings: Lungs are clear.  Apparent nodular opacity overlying the right lower lung reflects a nipple shadow. No pleural effusion or pneumothorax.  Cardiomediastinal silhouette is within normal limits.  Mild degenerative changes of the visualized thoracolumbar spine.  Cholecystectomy clips.  IMPRESSION: No evidence of acute cardiopulmonary  disease.  Original Report Authenticated By: Charline Bills, M.D.   Ct Angio Chest W/cm &/or Wo Cm  08/11/2011  *RADIOLOGY REPORT*  Clinical Data: Tachycardia, chest pain, evaluate for  PE  CT ANGIOGRAPHY CHEST  Technique:  Multidetector CT imaging of the chest using the standard protocol during bolus administration of intravenous contrast. Multiplanar reconstructed images including MIPs were obtained and reviewed to evaluate the vascular anatomy.  Contrast: OMNIPAQUE IOHEXOL 350 MG/ML SOLN  Comparison: Chest radiograph dated 08/11/2011.  CT chest dated 04/02/2010.  Findings: No evidence of pulmonary embolism.  Mild nodular scarring in the posterior right lower lobe (series 5/image 45) 4 mm right lower lobe pulmonary nodule (series 5/image 56).  Both findings are unchanged from 2011, benign.  No pleural effusion or pneumothorax.  There left thyroid gland is mildly nodular.  The heart is normal in size.  No pericardial effusion. Atherosclerotic calcifications of the aortic arch.  No suspicious mediastinal, hilar, or axillary lymphadenopathy.  Visualized upper abdomen is notable for a very small hiatal hernia, vascular calcifications, cholecystectomy clips, and dilatation of the common bile duct.  IMPRESSION: No evidence of pulmonary embolism.  No evidence of acute cardiopulmonary disease.  Original Report Authenticated By: Charline Bills, M.D.         MDM  Palpitations, anxiety, chest pain associated with nausea vomiting. Low suspicion of ACS  Hemoglobin stable. Heart rate improved to resting rate in the 90s. Chest pain resolved no shortness of breath. EKG nonischemic, delta troponin negative. D-dimer positive. CTPE negative.   Date: 08/11/2011  Rate: 99  Rhythm: normal sinus rhythm  QRS Axis: normal  Intervals: normal  ST/T Wave abnormalities: normal  Conduction Disutrbances:none  Narrative Interpretation:   Old EKG Reviewed: none available    MEDICATIONS GIVEN IN THE  E.D. Scheduled Meds:    . gi cocktail  30 mL Oral Once   Continuous Infusions:      IMPRESSION: 1. Palpitations      NEW MEDICATIONS: New Prescriptions   No medications on file     I personally performed the services described in this documentation, which was scribed in my presence.  The recorded information has been reviewed and considered.       Glynn Octave, MD 08/12/11 [phone]

## 2011-08-11 NOTE — ED Notes (Signed)
Patient to [facility], family at bedside to assist.

## 2011-08-11 NOTE — Discharge Instructions (Signed)
Palpitations  A palpitation is the feeling that your heartbeat is irregular or is faster than normal. Although this is frightening, it usually is not serious. Palpitations may be caused by excesses of smoking, caffeine, or alcohol. They are also brought on by stress and anxiety. Sometimes, they are caused by heart disease. Unless otherwise noted, your caregiver did not find any signs of serious illness at this time. HOME CARE INSTRUCTIONS  To help prevent palpitations:  Drink decaffeinated coffee, tea, and soda pop. Avoid chocolate.   If you smoke or drink alcohol, quit or cut down as much as possible.   Reduce your stress or anxiety level. Biofeedback, yoga, or meditation will help you relax. Physical activity such as swimming, jogging, or walking also may be helpful.  SEEK MEDICAL CARE IF:   You continue to have a fast heartbeat.   Your palpitations occur more often.  SEEK IMMEDIATE MEDICAL CARE IF: You develop chest pain, shortness of breath, severe headache, dizziness, or fainting. Document Released: 04/20/2000 Document Revised: 04/12/2011 Document Reviewed: 06/20/2007 [facility] Patient Information 2012 [place], [place].

## 2011-08-11 NOTE — ED Notes (Signed)
Pt states her heart is beating fast.

## 2011-08-12 LAB — TSH: TSH: 2.463 u[IU]/mL (ref 0.350–4.500)

## 2011-08-12 LAB — T4, FREE: Free T4: 1.31 ng/dL (ref 0.80–1.80)

## 2011-08-13 ENCOUNTER — Telehealth (INDEPENDENT_AMBULATORY_CARE_PROVIDER_SITE_OTHER): Payer: Self-pay | Admitting: *Deleted

## 2011-08-13 ENCOUNTER — Ambulatory Visit (INDEPENDENT_AMBULATORY_CARE_PROVIDER_SITE_OTHER): Payer: Medicare Other | Admitting: Family Medicine

## 2011-08-13 ENCOUNTER — Encounter: Payer: Self-pay | Admitting: Family Medicine

## 2011-08-13 VITALS — BP 130/74 | HR 83 | Resp 16 | Ht 62.0 in | Wt 106.8 lb

## 2011-08-13 DIAGNOSIS — C679 Malignant neoplasm of bladder, unspecified: Secondary | ICD-10-CM

## 2011-08-13 DIAGNOSIS — I10 Essential (primary) hypertension: Secondary | ICD-10-CM

## 2011-08-13 DIAGNOSIS — F411 Generalized anxiety disorder: Secondary | ICD-10-CM

## 2011-08-13 DIAGNOSIS — K219 Gastro-esophageal reflux disease without esophagitis: Secondary | ICD-10-CM

## 2011-08-13 DIAGNOSIS — F419 Anxiety disorder, unspecified: Secondary | ICD-10-CM

## 2011-08-13 MED ORDER — ALPRAZOLAM 0.5 MG PO TABS: ORAL_TABLET | ORAL | Status: DC

## 2011-08-13 MED ORDER — AMLODIPINE BESY-BENAZEPRIL HCL 10-20 MG PO CAPS: 1.0000 | ORAL_CAPSULE | Freq: Every evening | ORAL | Status: DC

## 2011-08-13 MED ORDER — FAMOTIDINE 20 MG PO TABS: 20.0000 mg | ORAL_TABLET | Freq: Two times a day (BID) | ORAL | Status: DC

## 2011-08-13 NOTE — Telephone Encounter (Signed)
[name] is requesting a refill on Famotidine 20 mg tablet, take 1 tablet twice daily.

## 2011-08-13 NOTE — Progress Notes (Signed)
  Subjective:    Patient ID: [name], female    DOB: November 22, 1933, 78 y.o.   MRN: [id number]  HPI Pt in for Ed follow up for palpitations , she is very concerned about this has a follow up with cardiology in the next several weeks but both herself and her daughter are anxious to have the date moved forward. Denies chest pain , does have some fatigue at times, denies pND, orthopnea or leg swelling Also has a c/o intermittent abdominal pain, no nausea, vomiting or change in bowel movements noted   Review of Systems See HPI Denies recent fever or chills. Denies sinus pressure, nasal congestion, ear pain or sore throat. Denies chest congestion, productive cough or wheezing. Denies chest pains, palpitations and leg swelling Denies joint pain, swelling and limitation in mobility. Denies headaches, seizures, numbness, or tingling. Denies depression,does have mild  anxiety and  insomnia. Denies skin break down or rash.        Objective:   Physical Exam  Patient alert and oriented and in no cardiopulmonary distress.  HEENT: No facial asymmetry, EOMI, no sinus tenderness,  oropharynx pink and moist.  Neck supple no adenopathy.  Chest: Clear to auscultation bilaterally.  CVS: S1, S2 no murmurs, no S3.  ABD: Soft non tender. Bowel sounds normal.  Ext: No edema  MS: Adequate ROM spine, shoulders, hips and knees.  Skin: Intact, no ulcerations or rash noted.  Psych: Good eye contact, normal affect. Memory intact not anxious or depressed appearing.  CNS: CN 2-12 intact, power, tone and sensation normal throughout.       Assessment & Plan:

## 2011-08-13 NOTE — Patient Instructions (Signed)
F/u in 3 months.  Pleas call if new problems develop for a sooner appointment  Heart rate currently is regular and normal rate.  Medication for anxiety for judicious use is being prescribed, I expect 10 tablets to last from 45 to 60 days.  I agree with cardiology re evaluation as well as GI re evaluation

## 2011-08-15 MED ORDER — FAMOTIDINE 20 MG PO TABS: 20.0000 mg | ORAL_TABLET | Freq: Two times a day (BID) | ORAL | Status: DC

## 2011-08-22 ENCOUNTER — Encounter: Payer: Self-pay | Admitting: Physician Assistant

## 2011-08-22 ENCOUNTER — Ambulatory Visit (INDEPENDENT_AMBULATORY_CARE_PROVIDER_SITE_OTHER): Payer: Medicare Other | Admitting: Physician Assistant

## 2011-08-22 VITALS — BP 122/77 | HR 84 | Ht 62.0 in | Wt 105.0 lb

## 2011-08-22 DIAGNOSIS — R079 Chest pain, unspecified: Secondary | ICD-10-CM | POA: Insufficient documentation

## 2011-08-22 DIAGNOSIS — K219 Gastro-esophageal reflux disease without esophagitis: Secondary | ICD-10-CM

## 2011-08-22 DIAGNOSIS — I10 Essential (primary) hypertension: Secondary | ICD-10-CM

## 2011-08-22 DIAGNOSIS — R002 Palpitations: Secondary | ICD-10-CM

## 2011-08-22 NOTE — Assessment & Plan Note (Signed)
Followed by Dr. Karilyn Cota

## 2011-08-22 NOTE — Assessment & Plan Note (Signed)
Well-controlled on current medication regimen 

## 2011-08-22 NOTE — Assessment & Plan Note (Signed)
No further workup indicated. Patient has history of normal coronary arteries, by catheterization in 2003. More recently, she had a normal Lexiscan Myoview; EF 86%, in 2011.

## 2011-08-22 NOTE — Assessment & Plan Note (Signed)
No documented dysrhythmias during recent [facility] ED visit. EKG from that date was reviewed, indicating NSR 99 bpm with no ischemic changes. Patient has history of documented PACs, treated with atenolol. I recommended that if she were to have a recurrent spell, that she is to take one additional 25 mg tablet of atenolol. If this does not suppress her palpitations, then she can contact us for further recommendations. Also, if she were to have more frequent spells, we may need to either increase her atenolol dose, or switch her to another beta blocker (i.e. Toprol)

## 2011-08-22 NOTE — Patient Instructions (Signed)
   Take an extra Atenolol 25mg  tab if heart rate elevated for greater than 10 minutes  No caffeine Follow up as needed.

## 2011-08-22 NOTE — Progress Notes (Signed)
HPI: Patient presents as a post hospital followup, following recent visit to the [facility] ED on April  6. She presented with CP and palpitations. Cardiac markers were negative. D-dimer elevated, but CTA of chest negative for PE. TSH normal. EKG indicated NSR 99 bpm with no ischemic changes. Vitals: 107/38, pulse 108, afebrile. She was treated with a GI cocktail.   Patient recalls episode occurred while laying in bed. She does not recall any associated CP. In fact, she currently denies that she experienced any associated presyncope, SOB, or nausea. This is her first sustained episode of tachycardia palpitations in quite a while. She denies drinking caffeinated beverages. She reports compliance with her medications, which includes daily atenolol.  Allergies  Allergen Reactions  . Codeine Other (See Comments)    UNKNOWN  . Sulfonamide Derivatives Other (See Comments)    UNKNOWN    Current Outpatient Prescriptions  Medication Sig Dispense Refill  . acetaminophen (TYLENOL) 325 MG tablet Take 325 mg by mouth every 6 (six) hours as needed. headaches      . ALPRAZolam (XANAX) 0.5 MG tablet One tablet once daily, as needed for anxiety or sleep, Ten tablets to last 45 days or longer  10 tablet  1  . aluminum hydroxide-magnesium carbonate (GAVISCON) 31.7-137 MG/5ML suspension Take 5 mLs by mouth every 6 (six) hours as needed.        Marland Kitchen amLODipine-benazepril (LOTREL) 10-20 MG per capsule Take 1 capsule by mouth every evening. One cap daily  30 capsule  3  . atenolol (TENORMIN) 25 MG tablet Take 25 mg by mouth every morning.       . calcium-vitamin D (OSCAL 500/200 D-3) 500 MG tablet Take 1 tablet by mouth daily.       . famotidine (PEPCID) 20 MG tablet Take 1 tablet (20 mg total) by mouth 2 (two) times daily.  30 tablet  3  . megestrol (MEGACE) 40 MG/ML suspension Take 5 mLs by mouth daily.      . Multiple Vitamin (MULITIVITAMIN WITH MINERALS) TABS Take 1 tablet by mouth daily.      . Naproxen Sodium (ALEVE)  220 MG CAPS Take 220 mg by mouth as needed. For leg pain      . Polyethyl Glycol-Propyl Glycol (SYSTANE ULTRA PF) 0.4-0.3 % SOLN Place 2 drops into both eyes daily as needed. FOR IRRITATION       . RESTORIL 30 MG capsule TAKE 1 CAPSULE BY MOUTH AT BEDTIME.  30 each  2  . sucralfate (CARAFATE) 1 G tablet Take 1 g by mouth 2 (two) times daily.         Past Medical History  Diagnosis Date  . Heart palpitations     PAC's   . Hypertension   . Anxiety   . Bladder cancer   . GERD (gastroesophageal reflux disease)   . Helicobacter pylori (H. pylori)     treated in 2009    Past Surgical History  Procedure Date  . Cholecystectomy 1998  . Bladder surgey 2009  . Cataract extraction 2 years ago    both eyes    History   Social History  . Marital Status: Widowed    Spouse Name: N/A    Number of Children: N/A  . Years of Education: N/A   Occupational History  . retired     Social History Main Topics  . Smoking status: Never Smoker   . Smokeless tobacco: Never Used  . Alcohol Use: No  . Drug Use: No  .  Sexually Active: Not on file   Other Topics Concern  . Not on file   Social History Narrative  . No narrative on file    Family History  Problem Relation Age of Onset  . Hypertension Mother   . Diabetes Mother   . Pneumonia Mother   . Diabetes Father   . Stroke Sister   . Stroke Brother   . Hypertension Daughter   . Obesity Daughter   . Diabetes Daughter     ROS: no nausea, vomiting; no fever, chills; no melena, hematochezia; no claudication  PHYSICAL EXAM: BP 122/77  Pulse 84  Ht 5\' 2"  (1.575 m)  Wt 105 lb (47.628 kg)  BMI 19.20 kg/m2 GENERAL: 78 year old female; NAD HEENT: NCAT, PERRLA, EOMI; sclera clear; no xanthelasma NECK: palpable bilateral carotid pulses, no bruits; no JVD; no TM LUNGS: CTA bilaterally CARDIAC: RRR (S1, S2); no significant murmurs; no rubs or gallops ABDOMEN: soft, non-tender; intact BS EXTREMETIES: intact distal pulses; no  significant peripheral edema SKIN: warm/dry; no obvious rash/lesions MUSCULOSKELETAL: no joint deformity NEURO: no focal deficit; NL affect   EKG:    ASSESSMENT & PLAN:

## 2011-08-26 NOTE — Assessment & Plan Note (Signed)
Treated and in remission , new provider is dr Jerre Simon since Dr Rito Ehrlich has retired

## 2011-08-26 NOTE — Assessment & Plan Note (Signed)
Recurrent abdominal pain, will f/u with gI regarding this

## 2011-08-26 NOTE — Assessment & Plan Note (Signed)
Controlled, no change in medication  

## 2011-08-26 NOTE — Assessment & Plan Note (Signed)
Increased symptoms, pt to have access to xanax as needed

## 2011-09-07 ENCOUNTER — Other Ambulatory Visit: Payer: Self-pay | Admitting: Family Medicine

## 2011-09-07 ENCOUNTER — Telehealth: Payer: Self-pay | Admitting: Family Medicine

## 2011-09-07 MED ORDER — PROMETHAZINE HCL 12.5 MG PO TABS: 12.5000 mg | ORAL_TABLET | Freq: Four times a day (QID) | ORAL | Status: DC | PRN

## 2011-09-07 NOTE — Telephone Encounter (Signed)
Do you want her to come in or just send something in for her?

## 2011-09-07 NOTE — Telephone Encounter (Signed)
pls advise I habve sent in phenergan for short term use for nausea, if this continues , she should see her GI doc

## 2011-09-07 NOTE — Telephone Encounter (Signed)
Called patient and left message for them to return call at the office   

## 2011-09-10 ENCOUNTER — Encounter: Payer: Self-pay | Admitting: Family Medicine

## 2011-09-10 ENCOUNTER — Ambulatory Visit (INDEPENDENT_AMBULATORY_CARE_PROVIDER_SITE_OTHER): Payer: Medicare Other | Admitting: Family Medicine

## 2011-09-10 VITALS — BP 104/72 | HR 92 | Resp 18 | Ht 62.0 in | Wt 106.0 lb

## 2011-09-10 DIAGNOSIS — K219 Gastro-esophageal reflux disease without esophagitis: Secondary | ICD-10-CM

## 2011-09-10 DIAGNOSIS — R112 Nausea with vomiting, unspecified: Secondary | ICD-10-CM

## 2011-09-10 DIAGNOSIS — K5909 Other constipation: Secondary | ICD-10-CM | POA: Insufficient documentation

## 2011-09-10 DIAGNOSIS — K5900 Constipation, unspecified: Secondary | ICD-10-CM

## 2011-09-10 DIAGNOSIS — R1013 Epigastric pain: Secondary | ICD-10-CM

## 2011-09-10 NOTE — Progress Notes (Signed)
  Subjective:    Patient ID: [name], female    DOB: 03/24/1934, 78 y.o.   MRN: [id number]  HPI   Epigastric pain, nausea and reflux worsened for past 3-4 days. Has emesis of mostly acid, has longstanding history of this, was on dexilant and carafate but these did not help, then she changed to Pepcid which has helped until recently.Seen by Dr.Rehman in the past for this, she has lost weight over time secondary to inability to eat. She also continues to have problems with constipation, has tried miralax, PEG, sennakot, stool softeners, now uses suppository every day to have a BM   Review of Systems  GEN- denies fatigue, fever, weight loss,weakness, recent illness HEENT- denies eye drainage, change in vision, nasal discharge, CVS- denies chest pain, palpitations RESP- denies SOB, cough, wheeze ABD- + N/V, denieschange in stools,+ abd pain GU- denies dysuria, hematuria, dribbling, incontinence MSK- denies joint pain, muscle aches, injury Neuro- denies headache, dizziness, syncope, seizure activity      Objective:   Physical Exam GEN- NAD, alert and oriented x3, thin HEENT- PERRL, EOMI, non injected sclera, pink conjunctiva, MMM, oropharynx clear CVS- RRR, no murmur RESP-CTAB ABD-NABS,soft, TTP epigastric region, no rebound, no gaurding, no mass felt EXT- No edema Pulses- Radial, DP- 2+        Assessment & Plan:

## 2011-09-10 NOTE — Assessment & Plan Note (Signed)
Follow-up with GI, I did consider lactulose but I think with her reflux and nausea this may worsen

## 2011-09-10 NOTE — Assessment & Plan Note (Signed)
She appears to have significant reflux. She's been tried on multiple medications in the past which have not helped. At this time I will prescribe her GI cocktail she will also continue her Pepcid.

## 2011-09-10 NOTE — Telephone Encounter (Signed)
Pt seen in the office today.  

## 2011-09-10 NOTE — Assessment & Plan Note (Addendum)
Trial of phenergan. Patient to continue with fluids. She does not look ill appearing at all  Exam wnl, no signs of dehyration

## 2011-09-10 NOTE — Assessment & Plan Note (Signed)
She has chronic epigastric pain. I reviewed his CT scan from March of this year which was normal regarding the bowels other she did have some cyst of other structures. At this time I would give her a GI cocktail. She's been given this in the ER multiple times which helps her symptoms. She will pick up the bank in for her nausea vomiting. He'll she will call her gastroenterologist back

## 2011-09-10 NOTE — Patient Instructions (Signed)
Take the GI cocktail Use the pepcid twice a day Plenty of fluids  Call Dr. Karilyn Cota for earlier appt.  I will call and check on you this week

## 2011-09-11 ENCOUNTER — Ambulatory Visit: Payer: Medicare Other | Admitting: Family Medicine

## 2011-09-18 ENCOUNTER — Encounter (INDEPENDENT_AMBULATORY_CARE_PROVIDER_SITE_OTHER): Payer: Self-pay | Admitting: Internal Medicine

## 2011-09-18 ENCOUNTER — Ambulatory Visit (INDEPENDENT_AMBULATORY_CARE_PROVIDER_SITE_OTHER): Payer: Medicare Other | Admitting: Internal Medicine

## 2011-09-18 DIAGNOSIS — K219 Gastro-esophageal reflux disease without esophagitis: Secondary | ICD-10-CM

## 2011-09-18 DIAGNOSIS — K5900 Constipation, unspecified: Secondary | ICD-10-CM | POA: Insufficient documentation

## 2011-09-18 NOTE — Progress Notes (Signed)
Subjective:     Patient ID: [name], female   DOB: May 28, 1933, 78 y.o.   MRN: [id number]  HPI [name] is a a 78 yr old female here today for f/u.  She says her acid reflux is bad at night.  She also constipated. She has acid reflux during the day but it is not as bad as the night.   If she eats hamburger she will vomit it back up sometimes. She is eating lean chicken. No added oils. She usually has a BM every other day and usually takes a glycerin supp every other day.  Stools are normal size. Constipation x 2 weeks. No melena or bright red rectal bleeding. She has lost about 4 pounds since her last visit.  2009 EGD: Erosive antral gastritis and bulbar duodenitis. No evidence of active ulcer disease or complicated esophagitis.   Review of Systems see hpi. Current Outpatient Prescriptions  Medication Sig Dispense Refill  . acetaminophen (TYLENOL) 325 MG tablet Take 325 mg by mouth every 6 (six) hours as needed. headaches      . ALPRAZolam (XANAX) 0.5 MG tablet One tablet once daily, as needed for anxiety or sleep, Ten tablets to last 45 days or longer  10 tablet  1  . aluminum hydroxide-magnesium carbonate (GAVISCON) 31.7-137 MG/5ML suspension Take 5 mLs by mouth every 6 (six) hours as needed.        Marland Kitchen amLODipine-benazepril (LOTREL) 10-20 MG per capsule Take 1 capsule by mouth every evening. One cap daily  30 capsule  3  . atenolol (TENORMIN) 25 MG tablet Take 25 mg by mouth every morning.       . calcium-vitamin D (OSCAL 500/200 D-3) 500 MG tablet Take 1 tablet by mouth daily.       . famotidine (PEPCID) 20 MG tablet Take 1 tablet (20 mg total) by mouth 2 (two) times daily.  30 tablet  3  . Multiple Vitamin (MULITIVITAMIN WITH MINERALS) TABS Take 1 tablet by mouth daily.      . Naproxen Sodium (ALEVE) 220 MG CAPS Take 220 mg by mouth as needed. For leg pain      . NON FORMULARY GI cocktail BID prn x 2 week supply - to [facility]      . Polyethyl Glycol-Propyl Glycol (SYSTANE ULTRA PF)  0.4-0.3 % SOLN Place 2 drops into both eyes daily as needed. FOR IRRITATION       . sucralfate (CARAFATE) 1 G tablet Take 1 g by mouth daily.       . megestrol (MEGACE) 40 MG/ML suspension Take 5 mLs by mouth daily.      . promethazine (PHENERGAN) 12.5 MG tablet Take 1 tablet (12.5 mg total) by mouth every 6 (six) hours as needed for nausea.  30 tablet  0  . RESTORIL 30 MG capsule TAKE 1 CAPSULE BY MOUTH AT BEDTIME.  30 each  2   Past Medical History  Diagnosis Date  . Heart palpitations     PAC's   . Hypertension   . Anxiety   . Bladder cancer   . GERD (gastroesophageal reflux disease)   . Helicobacter pylori (H. pylori)     treated in 2009   Past Surgical History  Procedure Date  . Cholecystectomy 1998  . Bladder surgey 2009  . Cataract extraction 2 years ago    both eyes   History   Social History  . Marital Status: Widowed    Spouse Name: N/A    Number of Children:  N/A  . Years of Education: N/A   Occupational History  . retired     Social History Main Topics  . Smoking status: Never Smoker   . Smokeless tobacco: Never Used  . Alcohol Use: No  . Drug Use: No  . Sexually Active: Not on file   Other Topics Concern  . Not on file   Social History Narrative  . No narrative on file   Family Status  Relation Status Death Age  . Mother Deceased 38  . Father Deceased 48  . Sister Alive   . Brother [employer]  . Sister Alive   . Sister Alive   . Sister Deceased   . Sister Deceased     Cancer but unsure of type and primary site  . Brother Deceased   . Daughter Alive   . Daughter Alive     type 2 Diabetes  . Daughter Alive     Mental Retardation  . Son Deceased     Cause of Death unknown   Allergies  Allergen Reactions  . Codeine Other (See Comments)    UNKNOWN  . Sulfonamide Derivatives Other (See Comments)    UNKNOWN       Objective:   Physical Exam Filed Vitals:   09/18/11 1103  Height: 5' (1.524 m)  Weight: 104 lb 6.4 oz (47.356  kg)   Alert and oriented. Skin warm and dry. Oral mucosa is moist.   . Sclera anicteric, conjunctivae is pink. Thyroid not enlarged. No cervical lymphadenopathy. Lungs clear. Heart regular rate and rhythm.  Abdomen is soft. Bowel sounds are positive. No hepatomegaly. No abdominal masses felt. No tenderness.  No edema to lower extremities. Patient is alert and oriented.      Assessment:    GERD, which is not controlled at this time. Symptoms worse at night.  Constipation of 2 weeks duration.    Plan:    Linzess x 12 pills. Continue Pepcid. HOB up on blocks. GERD diet given to patient   OV in 6 months

## 2011-09-18 NOTE — Patient Instructions (Signed)
GERD diet given to patient. HOB elevated. Continue Pepcid BID. Crush Carafate before taking and dilute with one  Tsp on water.

## 2011-09-24 ENCOUNTER — Other Ambulatory Visit: Payer: Self-pay

## 2011-09-24 MED ORDER — AMLODIPINE BESY-BENAZEPRIL HCL 10-20 MG PO CAPS: 1.0000 | ORAL_CAPSULE | Freq: Every evening | ORAL | Status: DC

## 2011-10-01 ENCOUNTER — Other Ambulatory Visit: Payer: Self-pay | Admitting: Family Medicine

## 2011-10-04 ENCOUNTER — Ambulatory Visit: Payer: Medicare Other | Admitting: Family Medicine

## 2011-10-14 ENCOUNTER — Emergency Department (HOSPITAL_COMMUNITY): Payer: Medicare Other

## 2011-10-14 ENCOUNTER — Encounter (HOSPITAL_COMMUNITY): Payer: Self-pay | Admitting: Emergency Medicine

## 2011-10-14 ENCOUNTER — Emergency Department (HOSPITAL_COMMUNITY)
Admission: EM | Admit: 2011-10-14 | Discharge: 2011-10-15 | Disposition: A | Discharge status: Home / Self Care | Payer: Medicare Other | Attending: Emergency Medicine | Admitting: Emergency Medicine

## 2011-10-14 DIAGNOSIS — Z79899 Other long term (current) drug therapy: Secondary | ICD-10-CM | POA: Insufficient documentation

## 2011-10-14 DIAGNOSIS — I10 Essential (primary) hypertension: Secondary | ICD-10-CM | POA: Insufficient documentation

## 2011-10-14 DIAGNOSIS — K219 Gastro-esophageal reflux disease without esophagitis: Secondary | ICD-10-CM | POA: Insufficient documentation

## 2011-10-14 DIAGNOSIS — R51 Headache: Secondary | ICD-10-CM | POA: Insufficient documentation

## 2011-10-14 MED ORDER — HYDROCODONE-ACETAMINOPHEN 5-325 MG PO TABS
1.0000 | ORAL_TABLET | Freq: Once | ORAL | Status: AC
  Administered 2011-10-14: 1 via ORAL
  Filled 2011-10-14: qty 1

## 2011-10-14 NOTE — ED Provider Notes (Signed)
History  This chart was scribed for Tamara Lennert, MD by Cherlynn Perches. The patient was seen in room APA06/[facility]06. Patient's care was started at 2155.  CSN: [id number]  Arrival date & time 10/14/11  2155   First MD Initiated Contact with Patient 10/14/11 2203      Chief Complaint  Patient presents with  . Headache    (Consider location/radiation/quality/duration/timing/severity/associated sxs/prior treatment) Patient is a 78 y.o. female presenting with headaches. The history is provided by the patient and a relative. No language interpreter was used.  Headache  This is a new problem. The current episode started 1 to 2 hours ago. The problem occurs constantly. The problem has not changed since onset.The headache is associated with nothing. Pain location: top of head. The quality of the pain is described as sharp. The pain is moderate. The pain does not radiate.    [name] is a 78 y.o. female who presents to the Emergency Department complaining of 2 hours of a sudden onset, unchanged, moderate headache localized to the top of the head and described as sharp. Pt states that it feels like someone hit her on the top of her head. Pt denies any injury. Pt denies any history of headaches. Pt reports that she has not taken any medication for pain Pt denies associated symptoms including sore throat, ear pain, numbness, and neck pain.    Past Medical History  Diagnosis Date  . Heart palpitations     PAC's   . Hypertension   . Anxiety   . Bladder cancer   . GERD (gastroesophageal reflux disease)   . Helicobacter pylori (H. pylori)     treated in 2009    Past Surgical History  Procedure Date  . Cholecystectomy 1998  . Bladder surgey 2009  . Cataract extraction 2 years ago    both eyes    Family History  Problem Relation Age of Onset  . Hypertension Mother   . Diabetes Mother   . Pneumonia Mother   . Diabetes Father   . Stroke Sister   . Stroke Brother   . Hypertension  Daughter   . Obesity Daughter   . Diabetes Daughter     History  Substance Use Topics  . Smoking status: Never Smoker   . Smokeless tobacco: Never Used  . Alcohol Use: No    OB History    Grav Para Term Preterm Abortions TAB SAB Ect Mult Living                  Review of Systems  Constitutional: Negative for chills and fatigue.  HENT: Negative for congestion, sinus pressure and ear discharge.   Eyes: Negative for discharge.  Respiratory: Negative for cough.   Cardiovascular: Negative for chest pain.  Gastrointestinal: Negative for abdominal pain and diarrhea.  Genitourinary: Negative for frequency and hematuria.  Musculoskeletal: Negative for back pain.  Skin: Negative for rash.  Neurological: Positive for headaches. Negative for seizures.  Hematological: Negative.   Psychiatric/Behavioral: Negative for hallucinations.  All other systems reviewed and are negative.    Allergies  Codeine and Sulfonamide derivatives  Home Medications   Current Outpatient Rx  Name Route Sig Dispense Refill  . ACETAMINOPHEN 325 MG PO TABS Oral Take 325 mg by mouth every 6 (six) hours as needed. headaches    . ALPRAZOLAM 0.5 MG PO TABS  One tablet once daily, as needed for anxiety or sleep, Ten tablets to last 45 days or longer 10 tablet 1  .  ALUM HYDROXIDE-MAG CARBONATE 31.7-137 MG/5ML PO SUSP Oral Take 5 mLs by mouth every 6 (six) hours as needed.      Marland Kitchen AMLODIPINE BESY-BENAZEPRIL HCL 10-20 MG PO CAPS Oral Take 1 capsule by mouth every evening. One cap daily 30 capsule 3  . ATENOLOL 25 MG PO TABS Oral Take 25 mg by mouth every morning.     Marland Kitchen CALCIUM-VITAMIN D 500 MG PO TABS Oral Take 1 tablet by mouth daily.     Marland Kitchen FAMOTIDINE 20 MG PO TABS Oral Take 1 tablet (20 mg total) by mouth 2 (two) times daily. 30 tablet 3  . MEGESTROL ACETATE 40 MG/ML PO SUSP Oral Take 5 mLs by mouth daily.    . ADULT MULTIVITAMIN W/MINERALS CH Oral Take 1 tablet by mouth daily.    Marland Kitchen NAPROXEN SODIUM 220 MG PO  CAPS Oral Take 220 mg by mouth as needed. For leg pain    . NON FORMULARY  GI cocktail BID prn x 2 week supply - to [facility]    . POLYETHYL GLYCOL-PROPYL GLYCOL 0.4-0.3 % OP SOLN Both Eyes Place 2 drops into both eyes daily as needed. FOR IRRITATION     . PROMETHAZINE HCL 12.5 MG PO TABS Oral Take 1 tablet (12.5 mg total) by mouth every 6 (six) hours as needed for nausea. 30 tablet 0  . RESTORIL 30 MG PO CAPS  TAKE 1 CAPSULE BY MOUTH AT BEDTIME. 30 each 2  . SUCRALFATE 1 G PO TABS Oral Take 1 g by mouth daily.       BP 152/58  Pulse 81  Temp(Src) 98 F (36.7 C) (Oral)  Resp 16  Ht 5\' 1"  (1.549 m)  Wt 105 lb (47.628 kg)  BMI 19.84 kg/m2  SpO2 100%  Physical Exam  Nursing note and vitals reviewed. Constitutional: She is oriented to person, place, and time. She appears well-developed.  HENT:  Head: Normocephalic and atraumatic.  Eyes: Conjunctivae and EOM are normal. No scleral icterus.  Neck: Neck supple. No thyromegaly present.  Cardiovascular: Normal rate and regular rhythm.  Exam reveals no gallop and no friction rub.   No murmur heard. Pulmonary/Chest: No stridor. She has no wheezes. She has no rales. She exhibits no tenderness.  Abdominal: She exhibits no distension. There is no tenderness. There is no rebound.  Musculoskeletal: Normal range of motion. She exhibits no edema.  Lymphadenopathy:    She has no cervical adenopathy.  Neurological: She is oriented to person, place, and time. Coordination normal.  Skin: No rash noted. No erythema.  Psychiatric: She has a normal mood and affect. Her behavior is normal.    ED Course  Procedures (including critical care time)  DIAGNOSTIC STUDIES: Oxygen Saturation is 100% on room air, normal by my interpretation.    COORDINATION OF CARE: 10:10PM - Patient / Family / Caregiver understand and agree with initial ED impression and plan with expectations set for ED visit.     Labs Reviewed - No data to display No results  found.   No diagnosis found.    MDM      The chart was scribed for me under my direct supervision.  I personally performed the history, physical, and medical decision making and all procedures in the evaluation of this patient.Tamara Lennert, MD 10/14/11 [phone]

## 2011-10-14 NOTE — Discharge Instructions (Signed)
Follow up as needed

## 2011-10-14 NOTE — ED Notes (Signed)
Patient complaining of headache starting approximately 2 hours ago. States "it feels like somebody has hit me on top of my head, like a sharp pain."

## 2011-10-15 ENCOUNTER — Encounter (INDEPENDENT_AMBULATORY_CARE_PROVIDER_SITE_OTHER): Payer: Self-pay | Admitting: *Deleted

## 2011-10-15 ENCOUNTER — Ambulatory Visit (INDEPENDENT_AMBULATORY_CARE_PROVIDER_SITE_OTHER): Payer: Medicare Other | Admitting: Internal Medicine

## 2011-10-15 ENCOUNTER — Encounter (INDEPENDENT_AMBULATORY_CARE_PROVIDER_SITE_OTHER): Payer: Self-pay | Admitting: Internal Medicine

## 2011-10-15 ENCOUNTER — Other Ambulatory Visit (INDEPENDENT_AMBULATORY_CARE_PROVIDER_SITE_OTHER): Payer: Self-pay | Admitting: *Deleted

## 2011-10-15 VITALS — BP 118/68 | HR 72 | Temp 98.3°F | Resp 18 | Ht 62.0 in | Wt 103.7 lb

## 2011-10-15 DIAGNOSIS — R1013 Epigastric pain: Secondary | ICD-10-CM

## 2011-10-15 DIAGNOSIS — R634 Abnormal weight loss: Secondary | ICD-10-CM

## 2011-10-15 MED ORDER — MEGESTROL ACETATE 40 MG/ML PO SUSP: 200.0000 mg | Freq: Every day | ORAL | Status: AC

## 2011-10-15 MED ORDER — LINACLOTIDE 145 MCG PO CAPS: 145.0000 ug | ORAL_CAPSULE | Freq: Every day | ORAL | Status: DC

## 2011-10-15 NOTE — H&P (Signed)
[name], [name]              ACCOUNT NO.:  [id number]  MEDICAL RECORD NO.:  [id number]  LOCATION:  [facility]                         FACILITY:  [facility]  PHYSICIAN:  Lionel December, M.D.    DATE OF BIRTH:  1933/12/24  DATE OF ADMISSION:  10/15/2011 DATE OF DISCHARGE:  LH                             HISTORY & PHYSICAL   PRESENTING COMPLAINT:  Epigastric pain, anorexia, and weight loss.  HISTORY OF PRESENT ILLNESS:  [name] is 78 year old African American female, patient of Dr. Syliva Overman who is here for scheduled visit accompanied by her daughter, Ms. Hermelinda Dellen.  She was last seen 3 months ago.  She has lost 5 pounds since then.  The patient states that she is having epigastric pain virtually every day.  It occurs after evening meal, although she eats very light evening meal.  She states her appetite is good, but her daughter states she does not eat much.  While she does not have frank emesis, but she regurgitates clear, frothy liquid every evening.  Pain is fairly localized in midepigastric region and dull aching pain.  It may last for over 30 minutes.  She denies heartburn or dysphagia.  She has not taken Megace since her last visit. She also reports emesis when she drinks milkshake or ice cream.  There is no history of hematemesis, melena, or rectal bleeding.  She also gives history of constipation.  She was given samples of Linzess on her last visit, but she stopped taking after first dose as it did not work.  The patient had abdominopelvic CT back in March this year  revealing mild wall thickening to distal esophagus, felt to be nonspecific and she had bilateral renal cyst and stable 37 mm right adnexal cystic lesion unchanged from previous study of December 12, and slightly enlarged since 2009.  The patient's last EGD was over 4 years ago in March 2009, revealing erosive gastritis and duodenitis.  At that time, she was treated for H. pylori gastritis.  Review of the  systems is negative for fever, chills, or night sweats. She denies vaginal or urinary symptoms.  CURRENT MEDICATIONS: 1. Acetaminophen 325 mg p.o. q.i.d. p.r.n. headache. 2. Alprazolam 0.5 mg daily p.r.n. 3. Gaviscon 1 teaspoonful q. 6 p.r.n. 4. Amlodipine/benazepril 10/20 mg 1 capsule every morning. 5. Atenolol 25 mg p.o. daily. 6. Os-Cal 500/200 D3 one tablet p.o. daily. 7. Famotidine 20 mg p.o. b.i.d. 8. MVI one tablet p.o. daily. 9. Promethazine 12.5 mg p.o. q. 6 p.r.n. 10.Restoril 30 mg p.o. at bedtime. 11.Polyethylene glycol-propyl glycol 0.4/0.3% two drops to each eye     daily p.r.n.   1. Remote history of peptic ulcer disease.  EGD in March 2009,     revealed H. pylori gastritis and she has been treated with Pylera. 2. Chronic GERD. 3. History of anxiety. 4. Hypertension. 5. History of colonic polyps.  She has had 3 colonoscopies all by Dr.     Marlene Bast and last one was in 2008 and was negative. 6. Insomnia. 7. She had tubal ligation in 1970s and she had cholecystectomy for     biliary dyskinesia in 1998.  ALLERGIES:  To CODEINE and SULFONAMIDE.  FAMILY HISTORY:  Father died at young age of 45, cause unknown.  Mother was diabetic, also had CVA and died at 15.  One sister died in her 48s, she had morbid obesity.  Another sister died of unknown cancer at age 39.  One sister has diabetes and heart disease.  One brother died of MI in his 51s and one brother is living, he has diabetes.  SOCIAL HISTORY:  She is widowed.  She is retired.  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  She has 3 children living. One son died at age 56 of acute illness, cause unknown.  She has never smoked cigarettes and does not drink alcohol.  PHYSICAL EXAMINATION:  VITAL SIGNS:  Weight 103.7 pounds, she is 62 inches tall, pulse 72 per minute, regular, blood pressure 118/68, respirations 80, and temp is 98.3. HEENT:  Conjunctivae is pink.  Sclera is nonicteric.   Oropharyngeal mucosa is normal.  No neck masses or thyromegaly noted. CARDIAC:  With regular rhythm.  Normal S1 and S2.  No murmur or gallop noted. LUNGS:  Clear to auscultation. ABDOMEN:  Flat.  Bowel sounds are normal.  On palpation, soft abdomen, mild midepigastric tenderness.  No organomegaly or masses. RECTAL:  Deferred. EXTREMITIES:  No peripheral edema or clubbing noted.  ASSESSMENT:  Jamal is a 78 year old African American female who presents with worsening epigastric pain associated with anorexia and weight loss as well as regurgitation and/or vomiting.  She has remote history of peptic ulcer disease and she has been treated for H. pylori gastritis 4 years ago.  Recent CT did not show any lesions account for pain.  She has right adnexal cyst which appears to be chronic and possibly incidental.  She had normal LFTs back in March along with a normal CBC. It is possible she has chronic dyspepsia, but need to make sure she does not have recurrent peptic ulcer disease or gastric neoplasm.  She has been treated with various PPIs, but has not felt any better. Chronic constipation.  She needs to try Linzess again.  PLAN: 1. Linzess 145 mcg p.o. daily.  She has 4-week supply of samples.  2.     Megestrol 200 mg p.o. daily. 2. Diagnostic esophagogastroduodenoscopy to be performed at [facility] in     near future.                                           ______________________________ Lionel December, M.D.     NR/MEDQ  D:  10/15/2011  T:  10/15/2011  Job:  [id number]

## 2011-10-15 NOTE — Patient Instructions (Addendum)
EGD(esophagogastroduodenoscopy) to be scheduled. Take Linaclotide 145 mcg by mouth 30 minutes before breakfast daily.

## 2011-10-15 NOTE — ED Notes (Signed)
Pt stable at discharge Neuro WNL No complaints or distress noted; Denies Gaylyn Rong

## 2011-10-15 NOTE — Progress Notes (Signed)
PRESENTING COMPLAINT:  Epigastric pain, anorexia, and weight loss.  HISTORY OF PRESENT ILLNESS:  [name] is 78 year old African American  female, patient of [name] who is here for scheduled visit  accompanied by her daughter, [name]. She was last seen 3  months ago. She has lost 5 pounds since then. The patient states that  she is having epigastric pain virtually every day. It occurs after  evening meal, although she eats very light evening meal. She states her  appetite is good, but her daughter states she does not eat much. While  she does not have frank emesis, but she regurgitates clear, frothy  liquid every evening. Pain is fairly localized in midepigastric region  and dull aching pain. It may last for over 30 minutes. She denies  heartburn or dysphagia. She has not taken Megace since her last visit.  She also reports emesis when she drinks milkshake or ice cream. There  is no history of hematemesis, melena, or rectal bleeding. She also  gives history of constipation. She was given samples of Linzess on her  last visit, but she stopped taking after first dose as it did not work.  The patient had abdominopelvic CT back in March this year revealing  mild wall thickening to distal esophagus, felt to be nonspecific and she  had bilateral renal cyst and stable 37 mm right adnexal cystic lesion  unchanged from previous study of December 12, and slightly enlarged  since 2009.  The patient's last EGD was over 4 years ago in March 2009, revealing  erosive gastritis and duodenitis. At that time, she was treated for H.  pylori gastritis.  Review of the systems is negative for fever, chills, or night sweats.  She denies vaginal or urinary symptoms.  CURRENT MEDICATIONS:  1. Acetaminophen 325 mg p.o. q.i.d. p.r.n. headache.  2. Alprazolam 0.5 mg daily p.r.n.  3. Gaviscon 1 teaspoonful q. 6 p.r.n.  4. Amlodipine/benazepril 10/20 mg 1 capsule every morning.  5.  Atenolol 25 mg p.o. daily.  6. Os-Cal 500/200 D3 one tablet p.o. daily.  7. Famotidine 20 mg p.o. b.i.d.  8. MVI one tablet p.o. daily.  9. Promethazine 12.5 mg p.o. q. 6 p.r.n.  10.Restoril 30 mg p.o. at bedtime.  11.Polyethylene glycol-propyl glycol 0.4/0.3% two drops to each eye  daily p.r.n.  PAST MEDICAL HISTORY: 1. Remote history of peptic ulcer disease. EGD in March 2009,  revealed H. pylori gastritis and she has been treated with Pylera.  2. Chronic GERD.  3. History of anxiety.  4. Hypertension.  5. History of colonic polyps. She has had 3 colonoscopies all by Dr.  Marlene Washington and last one was in 2008 and was negative.  6. Insomnia.  7. She had tubal ligation in 1970s and she had cholecystectomy for  biliary dyskinesia in 1998.  ALLERGIES:  CODEINE and SULFA.  FAMILY HISTORY:  Father died at young age of 18, cause unknown. Mother  was diabetic, also had CVA and died at 67. One sister died in her 38s,  she had morbid obesity. Another sister died of unknown cancer at age  56. One sister has diabetes and heart disease. One brother died of MI  in his 44s and one brother is living, he has diabetes.  SOCIAL HISTORY:  She is widowed. She is retired. 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 She has 3 children living.  One son died at age 51 of acute illness, cause unknown. She has  never  smoked cigarettes and does not drink alcohol.  PHYSICAL EXAMINATION:  VITAL SIGNS: Weight 103.7 pounds, she is 62  inches tall, pulse 72 per minute, regular, blood pressure 118/68,  respirations 80, and temp is 98.3.  HEENT: Conjunctivae is pink. Sclera is nonicteric. Oropharyngeal  mucosa is normal. No neck masses or thyromegaly noted.  CARDIAC: With regular rhythm. Normal S1 and S2. No murmur or gallop  noted.  LUNGS: Clear to auscultation.  ABDOMEN: Flat. Bowel sounds are normal. On palpation, soft abdomen,  mild midepigastric tenderness. No organomegaly or masses.    RECTAL: Deferred.  EXTREMITIES: No peripheral edema or clubbing noted.  ASSESSMENT:  [name] is a 78 year old African American female who presents  with worsening epigastric pain associated with anorexia and weight loss  as well as regurgitation and/or vomiting. She has remote history of  peptic ulcer disease and she has been treated for H. pylori gastritis 4  years ago. Recent CT did not show any lesions account for pain. She  has right adnexal cyst which appears to be chronic and possibly  incidental. She had normal LFTs back in March along with a normal CBC.  It is possible she has chronic dyspepsia, but need to make sure she does  not have recurrent peptic ulcer disease or gastric neoplasm. She has  been treated with various PPIs, but has not felt any better.  Chronic constipation. She needs to try Linzess again.  PLAN:  1. Linzess 145 mcg p.o. daily. She has 4-week supply of samples. 2.  Megestrol 200 mg p.o. daily.  2. Diagnostic esophagogastroduodenoscopy to be performed at [facility] in  near future.

## 2011-10-16 ENCOUNTER — Encounter (HOSPITAL_COMMUNITY): Payer: Self-pay

## 2011-10-17 ENCOUNTER — Encounter: Payer: Self-pay | Admitting: Family Medicine

## 2011-10-17 ENCOUNTER — Ambulatory Visit (INDEPENDENT_AMBULATORY_CARE_PROVIDER_SITE_OTHER): Payer: Medicare Other | Admitting: Family Medicine

## 2011-10-17 VITALS — BP 128/62 | HR 62 | Resp 18 | Ht 62.0 in | Wt 102.1 lb

## 2011-10-17 DIAGNOSIS — F411 Generalized anxiety disorder: Secondary | ICD-10-CM

## 2011-10-17 DIAGNOSIS — F419 Anxiety disorder, unspecified: Secondary | ICD-10-CM

## 2011-10-17 DIAGNOSIS — I10 Essential (primary) hypertension: Secondary | ICD-10-CM

## 2011-10-17 DIAGNOSIS — H6120 Impacted cerumen, unspecified ear: Secondary | ICD-10-CM

## 2011-10-17 MED ORDER — ALPRAZOLAM 0.5 MG PO TABS: ORAL_TABLET | ORAL | Status: DC

## 2011-10-17 NOTE — Patient Instructions (Addendum)
Reschedule f/u to 3 months  Please call if you need me before.  All the best with upcoming endoscopy   Right ear irrigation succesful

## 2011-10-18 ENCOUNTER — Other Ambulatory Visit: Payer: Self-pay

## 2011-10-18 DIAGNOSIS — K219 Gastro-esophageal reflux disease without esophagitis: Secondary | ICD-10-CM

## 2011-10-18 MED ORDER — FAMOTIDINE 20 MG PO TABS: 20.0000 mg | ORAL_TABLET | Freq: Two times a day (BID) | ORAL | Status: DC

## 2011-10-18 MED ORDER — SODIUM CHLORIDE 0.45 % IV SOLN: Freq: Once | INTRAVENOUS | Status: DC

## 2011-10-19 ENCOUNTER — Ambulatory Visit (HOSPITAL_COMMUNITY)
Admission: RE | Admit: 2011-10-19 | Discharge: 2011-10-19 | Disposition: A | Discharge status: Home Health | Payer: Medicare Other | Source: Ambulatory Visit | Attending: Internal Medicine | Admitting: Internal Medicine

## 2011-10-19 ENCOUNTER — Encounter (HOSPITAL_COMMUNITY)
Admission: RE | Disposition: A | Discharge status: Home Health | Payer: Self-pay | Source: Ambulatory Visit | Attending: Internal Medicine

## 2011-10-19 ENCOUNTER — Encounter (HOSPITAL_COMMUNITY): Payer: Self-pay | Admitting: *Deleted

## 2011-10-19 DIAGNOSIS — R933 Abnormal findings on diagnostic imaging of other parts of digestive tract: Secondary | ICD-10-CM

## 2011-10-19 DIAGNOSIS — R630 Anorexia: Secondary | ICD-10-CM

## 2011-10-19 DIAGNOSIS — K2940 Chronic atrophic gastritis without bleeding: Secondary | ICD-10-CM | POA: Insufficient documentation

## 2011-10-19 DIAGNOSIS — R1013 Epigastric pain: Secondary | ICD-10-CM

## 2011-10-19 DIAGNOSIS — K208 Other esophagitis without bleeding: Secondary | ICD-10-CM

## 2011-10-19 DIAGNOSIS — R110 Nausea: Secondary | ICD-10-CM | POA: Insufficient documentation

## 2011-10-19 DIAGNOSIS — K2100 Gastro-esophageal reflux disease with esophagitis, without bleeding: Secondary | ICD-10-CM | POA: Insufficient documentation

## 2011-10-19 DIAGNOSIS — R112 Nausea with vomiting, unspecified: Secondary | ICD-10-CM

## 2011-10-19 HISTORY — PX: ESOPHAGOGASTRODUODENOSCOPY: SHX5428

## 2011-10-19 SURGERY — EGD (ESOPHAGOGASTRODUODENOSCOPY)
Anesthesia: Moderate Sedation

## 2011-10-19 MED ORDER — MIDAZOLAM HCL 5 MG/5ML IJ SOLN
INTRAMUSCULAR | Status: DC | PRN
  Administered 2011-10-19 (×2): 2 mg via INTRAVENOUS

## 2011-10-19 MED ORDER — MEPERIDINE HCL 25 MG/ML IJ SOLN
INTRAMUSCULAR | Status: DC | PRN
  Administered 2011-10-19: 25 mg via INTRAVENOUS

## 2011-10-19 MED ORDER — BUTAMBEN-TETRACAINE-BENZOCAINE 2-2-14 % EX AERO
INHALATION_SPRAY | CUTANEOUS | Status: DC | PRN
  Administered 2011-10-19: 2 via TOPICAL

## 2011-10-19 MED ORDER — MEPERIDINE HCL 50 MG/ML IJ SOLN
INTRAMUSCULAR | Status: AC
  Filled 2011-10-19: qty 1

## 2011-10-19 MED ORDER — MIDAZOLAM HCL 5 MG/5ML IJ SOLN
INTRAMUSCULAR | Status: AC
  Filled 2011-10-19: qty 10

## 2011-10-19 NOTE — H&P (Signed)
[name] is an 78 y.o. female.   Chief Complaint: Patient is here for EGD. HPI: Patient is 78 year old African female with recurrent epigastric pain anorexia and weight loss. She has not responded to therapy. She had abdominopelvic CT that in March 2013 when she was in emergency room and thickening to distal esophagus but no other abnormalities noted. He also gives history of nausea and regurgitation and emesis. Constipation has improved with therapy. Rest of the details can be found in my note from earlier this week.  Past Medical History  Diagnosis Date  . Heart palpitations     PAC's   . Hypertension   . Anxiety   . Bladder cancer   . GERD (gastroesophageal reflux disease)   . Helicobacter pylori (H. pylori)     treated in 2009    Past Surgical History  Procedure Date  . Cholecystectomy 1998  . Bladder surgey 2009  . Cataract extraction 2 years ago    both eyes    Family History  Problem Relation Age of Onset  . Hypertension Mother   . Diabetes Mother   . Pneumonia Mother   . Diabetes Father   . Stroke Sister   . Stroke Brother   . Hypertension Daughter   . Obesity Daughter   . Diabetes Daughter    Social History:  reports that she has never smoked. She has never used smokeless tobacco. She reports that she does not drink alcohol or use illicit drugs.  Allergies:  Allergies  Allergen Reactions  . Codeine Other (See Comments)    UNKNOWN  . Sulfonamide Derivatives Other (See Comments)    UNKNOWN    Medications Prior to Admission  Medication Sig Dispense Refill  . acetaminophen (TYLENOL) 500 MG tablet Take 500 mg by mouth every 6 (six) hours as needed. For pain      . ALPRAZolam (XANAX) 0.5 MG tablet One tablet once daily, as needed for anxiety or sleep, Ten tablets to last 45 days or longer  10 tablet  1  . aluminum hydroxide-magnesium carbonate (GAVISCON) 31.7-137 MG/5ML suspension Take 5 mLs by mouth every 6 (six) hours as needed. For upset stomach        . amLODipine-benazepril (LOTREL) 10-20 MG per capsule Take 1 capsule by mouth every evening. One cap daily  30 capsule  3  . atenolol (TENORMIN) 25 MG tablet Take 25 mg by mouth every morning.       . calcium carbonate (TUMS - DOSED IN MG ELEMENTAL CALCIUM) 500 MG chewable tablet Chew 2 tablets by mouth as needed. For heartburn      . calcium-vitamin D (OSCAL 500/200 D-3) 500 MG tablet Take 1 tablet by mouth daily.       . famotidine (PEPCID) 20 MG tablet Take 1 tablet (20 mg total) by mouth 2 (two) times daily.  30 tablet  3  . megestrol (MEGACE ORAL) 40 MG/ML suspension Take 5 mLs (200 mg total) by mouth daily.  240 mL  2  . Polyethyl Glycol-Propyl Glycol (SYSTANE ULTRA PF) 0.4-0.3 % SOLN Place 2 drops into both eyes daily as needed. FOR IRRITATION       . polyvinyl alcohol (ARTIFICIAL TEARS) 1.4 % ophthalmic solution Place 1 drop into both eyes as needed. For dry eyes      . promethazine (PHENERGAN) 12.5 MG tablet Take 12.5 mg by mouth every 6 (six) hours as needed. For nausea      . RESTORIL 30 MG capsule TAKE 1 CAPSULE  BY MOUTH AT BEDTIME.  30 each  2  . sucralfate (CARAFATE) 1 G tablet Take 1 g by mouth daily.       . Linaclotide (LINZESS) 145 MCG CAPS Take 145 mcg by mouth daily.  30 capsule  3  . Multiple Vitamin (MULITIVITAMIN WITH MINERALS) TABS Take 1 tablet by mouth daily.        No results found for this or any previous visit (from the past 48 hour(s)). No results found.  ROS  Blood pressure 128/54, pulse 63, temperature 97.7 F (36.5 C), temperature source Oral, resp. rate 20, SpO2 100.00%. Physical Exam  Constitutional: She appears well-developed.       Thin female in NAD  HENT:  Mouth/Throat: Oropharynx is clear and moist.  Eyes: Conjunctivae are normal. No scleral icterus.  Neck: No thyromegaly present.  Cardiovascular: Normal rate, regular rhythm and normal heart sounds.   No murmur heard. Respiratory: Effort normal and breath sounds normal.  GI: Soft. She exhibits  no distension and no mass. There is tenderness ( mild tenderness at epigastrium and LLQ).  Musculoskeletal: She exhibits no edema.  Lymphadenopathy:    She has no cervical adenopathy.  Neurological: She is alert.  Skin: Skin is warm and dry.     Assessment/Plan And epigastric pain anorexia weight loss nausea and vomiting. Abnormal CT with thickening to distal esophagus. Diagnostic EGD  Adlean Hardeman U 10/19/2011, 7:37 AM

## 2011-10-19 NOTE — Op Note (Signed)
EGD PROCEDURE REPORT  PATIENT:  [name]  MR#:  [id number] Birthdate:  1934-01-30, 78 y.o., female Endoscopist:  Dr. Malissa Hippo, MD Referred By:  Dr. Syliva Overman, MD Procedure Date: 10/19/2011  Procedure:   EGD  Indications:  Patient is 78 year old African female with chronic/recurrent epigastric pain associated with nausea and intermittent emesis as well as anorexia and weight loss. She had CT abdomen pelvis in March 2018 during her ER visit revealing distal esophageal thickening. Patient is undergoing diagnostic EGD.            Informed Consent:  The risks, benefits, alternatives & imponderables which include, but are not limited to, bleeding, infection, perforation, drug reaction and potential missed lesion have been reviewed.  The potential for biopsy, lesion removal, esophageal dilation, etc. have also been discussed.  Questions have been answered.  All parties agreeable.  Please see history & physical in medical record for more information.  Medications:  Demerol 25 mg IV Versed 4 mg IV Cetacaine spray topically for oropharyngeal anesthesia  Description of procedure:  The endoscope was introduced through the mouth and advanced to the second portion of the duodenum without difficulty or limitations. The mucosal surfaces were surveyed very carefully during advancement of the scope and upon withdrawal.  Findings:  Esophagus:  Mucosa of the esophagus was normal. Focal edema and erythema noted at GE junction on the gastric side.  GEJ:  35 cm Stomach:  Stomach was empty and distended very well with insufflation. Folds in the proximal stomach were normal. Mucosa at gastric body was normal. Antral mucosa revealed patchy edema and erythema. Some areas with black eschar. Deformed but patent pylorus. Angularis, fundus and cardia were examined by retroflexing the scope and were normal. Duodenum:  Short bulb with normal normal post bulbar mucosa and folds.  Therapeutic/Diagnostic  Maneuvers Performed:  Antral biopsy taken for routine histology.  Complications:  None  Impression: Mild changes of reflux esophagitis limited to GE junction. Nonerosive antral gastritis with deformed but patent pylorus. Antral biopsy taken for routine histology.  Recommendations:  Standard instructions given. I will contact patient and/or her daughter  with biopsy results and further recommendations.  Jaydee Conran U  10/19/2011  8:03 AM  CC: Dr. Syliva Overman, MD & Dr. Bonnetta Barry ref. provider found

## 2011-10-19 NOTE — Discharge Instructions (Signed)
Resume usual medications and diet. No driving for 24 hour Physician will contact you with biopsy results.  Esophagogastroduodenoscopy This is an endoscopic procedure (a procedure that uses a device like a flexible telescope) that allows your caregiver to view the upper stomach and small bowel. This test allows your caregiver to look at the esophagus. The esophagus carries food from your mouth to your stomach. They can also look at your duodenum. This is the first part of the small intestine that attaches to the stomach. This test is used to detect problems in the bowel such as ulcers and inflammation. PREPARATION FOR TEST Nothing to eat after midnight the day before the test. NORMAL FINDINGS Normal esophagus, stomach, and duodenum. Ranges for normal findings may vary among different laboratories and hospitals. You should always check with your doctor after having lab work or other tests done to discuss the meaning of your test results and whether your values are considered within normal limits. MEANING OF TEST  Your caregiver will go over the test results with you and discuss the importance and meaning of your results, as well as treatment options and the need for additional tests if necessary. OBTAINING THE TEST RESULTS It is your responsibility to obtain your test results. Ask the lab or department performing the test when and how you will get your results.  FINDING OUT THE RESULTS OF YOUR TEST Not all test results are available during your visit. If your test results are not back during the visit, make an appointment with your caregiver to find out the results. Do not assume everything is normal if you have not heard from your caregiver or the medical facility. It is important for you to follow up on all of your test results.  SEEK IMMEDIATE MEDICAL CARE IF:  You have more than a spotting of blood in your stool.   Your belly is swollen (abdominal distention).   You are nauseated or  vomiting.   You have a fever.   You have abdominal pain or discomfort that is severe or gets worse throughout the day.

## 2011-10-21 DIAGNOSIS — H6120 Impacted cerumen, unspecified ear: Secondary | ICD-10-CM | POA: Insufficient documentation

## 2011-10-21 NOTE — Assessment & Plan Note (Signed)
Cerumen impaction in right ear causing pain, successful irrigation with no complications during visit

## 2011-10-21 NOTE — Assessment & Plan Note (Signed)
Controlled, no change in medication  

## 2011-10-21 NOTE — Assessment & Plan Note (Signed)
Improved, uses xanax sparingly

## 2011-10-21 NOTE — Progress Notes (Signed)
  Subjective:    Patient ID: [name], female    DOB: 1934/01/25, 78 y.o.   MRN: [id number]  HPI Pt in with primary complaint of ear pain particularly of the right ear. States the GI Doc who recently saw her stated she had wax in the ear and this may be the problem, so she is here for thisevaluation. Has upper endoscopy later this week, has had ED visits with c/o abdominal pain in the recent past   Review of Systems See HPI Denies recent fever or chills. Denies sinus pressure, nasal congestion,  or sore throat. Denies chest congestion, productive cough or wheezing. Denies chest pains, palpitations and leg swelling .   Denies dysuria, frequency, hesitancy or incontinence. Denies joint pain, swelling and limitation in mobility. Denies headaches, seizures, numbness, or tingling. Denies depression,uncontrolled  anxiety or insomnia. Denies skin break down or rash.        Objective:   Physical Exam  Patient alert and oriented and in no cardiopulmonary distress.  HEENT: No facial asymmetry, EOMI, no sinus tenderness,  oropharynx pink and moist.  Neck supple no adenopathy.Left tM clear, normal light reflex, right TM occluded with wax. Right ear irrigated by nursing with successful removal of all cerumen, TM post procedure clear, no erythema or trauma to ear drum  Chest: Clear to auscultation bilaterally.  CVS: S1, S2 no murmurs, no S3.  ABD: Soft non tender. Bowel sounds normal.  Ext: No edema  MS: Adequate ROM spine, shoulders, hips and knees.  Skin: Intact, no ulcerations or rash noted.  Psych: Good eye contact, normal affect. Memory intact not anxious or depressed appearing.  CNS: CN 2-12 intact, power, tone and sensation normal throughout.       Assessment & Plan:

## 2011-10-23 ENCOUNTER — Encounter (INDEPENDENT_AMBULATORY_CARE_PROVIDER_SITE_OTHER): Payer: Self-pay | Admitting: *Deleted

## 2011-10-24 ENCOUNTER — Encounter (HOSPITAL_COMMUNITY): Payer: Self-pay | Admitting: Internal Medicine

## 2011-11-12 ENCOUNTER — Ambulatory Visit: Payer: Medicare Other | Admitting: Family Medicine

## 2011-11-30 ENCOUNTER — Other Ambulatory Visit: Payer: Self-pay

## 2011-11-30 MED ORDER — ATENOLOL 25 MG PO TABS: 25.0000 mg | ORAL_TABLET | ORAL | Status: DC

## 2011-12-25 ENCOUNTER — Encounter: Payer: Self-pay | Admitting: Family Medicine

## 2011-12-25 ENCOUNTER — Ambulatory Visit (INDEPENDENT_AMBULATORY_CARE_PROVIDER_SITE_OTHER): Payer: Medicare Other | Admitting: Family Medicine

## 2011-12-25 VITALS — BP 130/68 | HR 87 | Resp 16 | Ht 60.0 in | Wt 107.8 lb

## 2011-12-25 DIAGNOSIS — R51 Headache: Secondary | ICD-10-CM

## 2011-12-25 MED ORDER — KETOROLAC TROMETHAMINE 60 MG/2ML IJ SOLN
30.0000 mg | Freq: Once | INTRAMUSCULAR | Status: AC
  Administered 2011-12-25: 30 mg via INTRAMUSCULAR

## 2011-12-25 NOTE — Progress Notes (Signed)
  Subjective:    Patient ID: [name], female    DOB: 1934-03-02, 78 y.o.   MRN: [id number]  HPI  Patient here secondary to chronic headache. She has history of headache on and off. She was evaluated in emergency room in June of 2013 with a negative CT scan. Her daughter states she was given Valium at that time and it resolved. She typically gives her Tylenol for headache and if does not improve or is severe gives her excedrin 1 tablet, which helps. This Sunday patient began to have severe headache and she does not go to church. She denies change in vision, injury, chest pain, change in speech. She states occasionally she is dizzy but has not been thus far ( note when getting off the exam table she was dizzy and stumbled) she did take an excedrin but this did not last long, pain was rated 10/10  On Sunday, has decreased since but persistent headache.   Review of Systems   GEN- denies fatigue, fever, weight loss,weakness, recent illness HEENT- denies eye drainage, change in vision, nasal discharge, CVS- denies chest pain, palpitations RESP- denies SOB, cough, wheeze ABD- denies N/V, change in stools, abd pain GU- denies dysuria, hematuria, dribbling, incontinence MSK- denies joint pain, muscle aches, injury Neuro- +headache, +dizziness, syncope, seizure activity      Objective:   Physical Exam GEN- NAD, alert and oriented x3 HEENT- PERRL, EOMI, non injected sclera, pink conjunctiva, MMM, oropharynx clear, fundoscopic exam difficult, but no papilledema appreciated , TM Clear bilat, no sinus tenderness , head TTP on top of head Neck- Supple, no thryomegaly CVS- RRR, no murmur RESP-CTAB ABD-NABS,soft,NT.ND EXT- No edema Pulses- Radial, DP- 2+ Neuro- CNII-XII grossly in tact, no focal deficits         Assessment & Plan:

## 2011-12-25 NOTE — Assessment & Plan Note (Signed)
With her current headache and which she sought care in the emergency department as well as severity of pain I will obtain MRI of brain. Her neurological exam does not show anything focal today. She was given injection in the office for pain control. At this time I will allow her to continue with the Tylenol for one Excedrin. I discussed this with her daughter they prefer to go the route of obtaining imaging and if negative will just treat headache as needed with medications and for referral to neurologist

## 2011-12-25 NOTE — Patient Instructions (Addendum)
Give shot today for headache MRI to be scheduled  Okay to give 1 excedrin and repeat later, no more than 2 a day  Otherwise, if not severe regular tylenol/acetominophen  Keep previous f/u appointment

## 2011-12-27 ENCOUNTER — Telehealth: Payer: Self-pay | Admitting: Family Medicine

## 2011-12-28 ENCOUNTER — Ambulatory Visit (HOSPITAL_COMMUNITY)
Admission: RE | Admit: 2011-12-28 | Discharge: 2011-12-28 | Disposition: A | Discharge status: Home / Self Care | Payer: Medicare Other | Source: Ambulatory Visit | Attending: Family Medicine | Admitting: Family Medicine

## 2011-12-28 DIAGNOSIS — R42 Dizziness and giddiness: Secondary | ICD-10-CM | POA: Insufficient documentation

## 2011-12-28 DIAGNOSIS — R51 Headache: Secondary | ICD-10-CM

## 2011-12-28 DIAGNOSIS — G319 Degenerative disease of nervous system, unspecified: Secondary | ICD-10-CM | POA: Insufficient documentation

## 2011-12-28 NOTE — Telephone Encounter (Signed)
Patient is aware 

## 2012-01-01 ENCOUNTER — Other Ambulatory Visit: Payer: Self-pay

## 2012-01-01 MED ORDER — AMLODIPINE BESY-BENAZEPRIL HCL 10-20 MG PO CAPS: 1.0000 | ORAL_CAPSULE | Freq: Every evening | ORAL | Status: DC

## 2012-01-15 ENCOUNTER — Ambulatory Visit (INDEPENDENT_AMBULATORY_CARE_PROVIDER_SITE_OTHER): Payer: Medicare Other | Admitting: Internal Medicine

## 2012-01-15 ENCOUNTER — Encounter (INDEPENDENT_AMBULATORY_CARE_PROVIDER_SITE_OTHER): Payer: Self-pay | Admitting: Internal Medicine

## 2012-01-15 VITALS — BP 112/72 | HR 68 | Temp 98.7°F | Resp 18 | Ht 62.0 in | Wt 110.2 lb

## 2012-01-15 DIAGNOSIS — K5900 Constipation, unspecified: Secondary | ICD-10-CM

## 2012-01-15 DIAGNOSIS — R1013 Epigastric pain: Secondary | ICD-10-CM

## 2012-01-15 DIAGNOSIS — R634 Abnormal weight loss: Secondary | ICD-10-CM

## 2012-01-15 MED ORDER — DOCUSATE SODIUM 100 MG PO CAPS: 100.0000 mg | ORAL_CAPSULE | Freq: Every day | ORAL | Status: AC

## 2012-01-15 NOTE — Progress Notes (Signed)
Presenting complaint;  Followup for anorexia epigastric pain and weight loss.  Subjective:  Patient is 78 year old [place] female who is here for scheduled visit accompanied by her daughter. Her last visit she had multiple symptoms including constipation. She was begun on Megace and Linzess. Following that visit she underwent EGD on 10/19/2011 which revealed mild changes of reflux esophagitis limited to GE junction and nonerosive antral gastritis. Biopsy was negative for H. Pylori infection and revealed chronic gastritis with intestinal metaplasia. She stopped Linzess after  few doses but does not remember if she had side effects. She feels much better. Her appetite is improved and she has gained 7 pounds since her last visit. She has had very little epigastric pain. She she has sporadic emesis at all she vomits his clear fluids. Her bowels are moving better and she is using MOM when necessary basis. Her daughter is very pleased with the way she she is doing.  Current Medications: Current Outpatient Prescriptions  Medication Sig Dispense Refill  . acetaminophen (TYLENOL) 500 MG tablet Take 500 mg by mouth every 6 (six) hours as needed. For pain      . ALPRAZolam (XANAX) 0.5 MG tablet One tablet once daily, as needed for anxiety or sleep, Ten tablets to last 45 days or longer  10 tablet  1  . aluminum hydroxide-magnesium carbonate (GAVISCON) 31.7-137 MG/5ML suspension Take 5 mLs by mouth every 6 (six) hours as needed. For upset stomach      . amLODipine-benazepril (LOTREL) 10-20 MG per capsule Take 1 capsule by mouth every evening. One cap daily  30 capsule  3  . atenolol (TENORMIN) 25 MG tablet Take 1 tablet (25 mg total) by mouth every morning.  30 tablet  3  . calcium carbonate (TUMS - DOSED IN MG ELEMENTAL CALCIUM) 500 MG chewable tablet Chew 2 tablets by mouth as needed. For heartburn      . calcium-vitamin D (OSCAL 500/200 D-3) 500 MG tablet Take 1 tablet by mouth daily.       .  famotidine (PEPCID) 20 MG tablet Take 1 tablet (20 mg total) by mouth 2 (two) times daily.  30 tablet  3  . megestrol (MEGACE) 40 MG/ML suspension Take 40 mg by mouth daily. Take 1 teaspoonful ( ) once daily      . Multiple Vitamin (MULITIVITAMIN WITH MINERALS) TABS Take 1 tablet by mouth daily.      Bertram Gala Glycol-Propyl Glycol (SYSTANE ULTRA PF) 0.4-0.3 % SOLN Place 2 drops into both eyes daily as needed. FOR IRRITATION       . polyvinyl alcohol (ARTIFICIAL TEARS) 1.4 % ophthalmic solution Place 1 drop into both eyes as needed. For dry eyes      . RESTORIL 30 MG capsule TAKE 1 CAPSULE BY MOUTH AT BEDTIME.  30 each  2  . Linaclotide (LINZESS) 145 MCG CAPS Take 145 mcg by mouth daily.  30 capsule  3    Objective: Blood pressure 112/72, pulse 68, temperature 98.7 F (37.1 C), temperature source Oral, resp. rate 18, height 5\' 2"  (1.575 m), weight 110 lb 3.2 oz (49.986 kg). Patient is alert and in no acute distress Conjunctiva is pink. Sclera is nonicteric Oropharyngeal mucosa is normal. No neck masses or thyromegaly noted. Abdomen is flat soft and nontender without no organomegaly or mass No LE edema or clubbing noted.   Assessment:  All of patient's symptoms have improved and or resolved. Most importantly she has gained 7 pounds. Suspect her epigastric pain is equivalent  to chronic dyspepsia. GERD symptoms appear to be well controlled with therapy.   Plan:  Continue current therapy. Will discontinue Megace when her weight is up to 115 pounds. She will stop by at office for weight check in 3 months. Office visit in 6 months.

## 2012-01-15 NOTE — Patient Instructions (Addendum)
Stop by at the office for weight check in 3 months.

## 2012-01-16 ENCOUNTER — Encounter: Payer: Self-pay | Admitting: Family Medicine

## 2012-01-16 ENCOUNTER — Ambulatory Visit (INDEPENDENT_AMBULATORY_CARE_PROVIDER_SITE_OTHER): Payer: Medicare Other | Admitting: Family Medicine

## 2012-01-16 VITALS — BP 102/60 | HR 65 | Resp 15 | Ht 60.0 in | Wt 110.4 lb

## 2012-01-16 DIAGNOSIS — E785 Hyperlipidemia, unspecified: Secondary | ICD-10-CM

## 2012-01-16 DIAGNOSIS — F411 Generalized anxiety disorder: Secondary | ICD-10-CM

## 2012-01-16 DIAGNOSIS — I10 Essential (primary) hypertension: Secondary | ICD-10-CM

## 2012-01-16 DIAGNOSIS — J984 Other disorders of lung: Secondary | ICD-10-CM

## 2012-01-16 DIAGNOSIS — C679 Malignant neoplasm of bladder, unspecified: Secondary | ICD-10-CM

## 2012-01-16 DIAGNOSIS — Z23 Encounter for immunization: Secondary | ICD-10-CM

## 2012-01-16 DIAGNOSIS — G4700 Insomnia, unspecified: Secondary | ICD-10-CM

## 2012-01-16 MED ORDER — AMLODIPINE BESY-BENAZEPRIL HCL 5-20 MG PO CAPS: 1.0000 | ORAL_CAPSULE | Freq: Every day | ORAL | Status: DC

## 2012-01-16 NOTE — Patient Instructions (Addendum)
Annual wellness in end October/end October  Dose reduction of blood pressure medication effective 01/16/2012   Fasting lipid, cmp before next visit.  You will get script for shingles vaccine and info

## 2012-01-16 NOTE — Assessment & Plan Note (Signed)
Followed by Dr Jerre Simon, recently evaluated in June has 1 year follow up

## 2012-01-16 NOTE — Assessment & Plan Note (Signed)
Hyperlipidemia:Low fat diet discussed and encouraged.  Updated lab prior to next visit 

## 2012-01-16 NOTE — Assessment & Plan Note (Signed)
Reviewed sleep hygiene with pt, she continues on restoril

## 2012-01-16 NOTE — Assessment & Plan Note (Signed)
Improved and controlled on medication 

## 2012-01-16 NOTE — Progress Notes (Signed)
  Subjective:    Patient ID: [name], female    DOB: Apr 13, 1934, 78 y.o.   MRN: [id number]  HPI The PT is here for follow up and re-evaluation of chronic medical conditions, medication management and review of any available recent lab and radiology data.  Preventive health is updated, specifically  Cancer screening and Immunization.   Questions or concerns regarding consultations or procedures which the PT has had in the interim are  Addressed.Being followed closely by GI. Is on megace and with good results. Has 1 year follow up with urology next June with Dr Jerre Simon re her bladder cancer The PT denies any adverse reactions to current medications since the last visit.  There are no new concerns.  There are no specific complaints       Review of Systems Denies recent fever or chills. Denies sinus pressure, nasal congestion, ear pain or sore throat. Denies chest congestion, productive cough or wheezing. Denies chest pains, palpitations and leg swelling Denies abdominal pain, nausea, vomiting,diarrhea or constipation.   Denies dysuria, frequency, hesitancy or incontinence. Denies joint pain, swelling and limitation in mobility. Denies headaches, seizures, numbness, or tingling. Denies depression or uncontrolled anxiety or insomnia. Denies skin break down or rash.        Objective:   Physical Exam Patient alert and oriented and in no cardiopulmonary distress.  HEENT: No facial asymmetry, EOMI, no sinus tenderness,  oropharynx pink and moist.  Neck supple no adenopathy.  Chest: Clear to auscultation bilaterally.  CVS: S1, S2 no murmurs, no S3.  ABD: Soft non tender. Bowel sounds normal.  Ext: No edema  MS: Adequate ROM spine, shoulders, hips and knees.  Skin: Intact, no ulcerations or rash noted.  Psych: Good eye contact, normal affect. Memory intact not anxious or depressed appearing.  CNS: CN 2-12 intact, power, tone and sensation normal throughout.          Assessment & Plan:

## 2012-01-16 NOTE — Assessment & Plan Note (Signed)
Over corrected, dose reduction in lotrel

## 2012-02-12 ENCOUNTER — Telehealth (INDEPENDENT_AMBULATORY_CARE_PROVIDER_SITE_OTHER): Payer: Self-pay | Admitting: *Deleted

## 2012-02-12 ENCOUNTER — Encounter (HOSPITAL_COMMUNITY): Payer: Self-pay | Admitting: *Deleted

## 2012-02-12 ENCOUNTER — Other Ambulatory Visit: Payer: Self-pay

## 2012-02-12 ENCOUNTER — Emergency Department (HOSPITAL_COMMUNITY)
Admission: EM | Admit: 2012-02-12 | Discharge: 2012-02-13 | Disposition: A | Discharge status: Home / Self Care | Payer: Medicare Other | Attending: Emergency Medicine | Admitting: Emergency Medicine

## 2012-02-12 DIAGNOSIS — I10 Essential (primary) hypertension: Secondary | ICD-10-CM | POA: Insufficient documentation

## 2012-02-12 DIAGNOSIS — K219 Gastro-esophageal reflux disease without esophagitis: Secondary | ICD-10-CM | POA: Insufficient documentation

## 2012-02-12 DIAGNOSIS — Z79899 Other long term (current) drug therapy: Secondary | ICD-10-CM | POA: Insufficient documentation

## 2012-02-12 DIAGNOSIS — R10816 Epigastric abdominal tenderness: Secondary | ICD-10-CM | POA: Insufficient documentation

## 2012-02-12 DIAGNOSIS — R109 Unspecified abdominal pain: Secondary | ICD-10-CM

## 2012-02-12 DIAGNOSIS — R112 Nausea with vomiting, unspecified: Secondary | ICD-10-CM

## 2012-02-12 DIAGNOSIS — R1013 Epigastric pain: Secondary | ICD-10-CM | POA: Insufficient documentation

## 2012-02-12 DIAGNOSIS — Z8551 Personal history of malignant neoplasm of bladder: Secondary | ICD-10-CM | POA: Insufficient documentation

## 2012-02-12 DIAGNOSIS — F411 Generalized anxiety disorder: Secondary | ICD-10-CM | POA: Insufficient documentation

## 2012-02-12 LAB — CBC
HCT: 42.7 % (ref 36.0–46.0)
Hemoglobin: 14.7 g/dL (ref 12.0–15.0)
MCH: 32.8 pg (ref 26.0–34.0)
MCHC: 34.4 g/dL (ref 30.0–36.0)
MCV: 95.3 fL (ref 78.0–100.0)
Platelets: 260 10*3/uL (ref 150–400)
RBC: 4.48 MIL/uL (ref 3.87–5.11)
RDW: 13.9 % (ref 11.5–15.5)
WBC: 6.9 10*3/uL (ref 4.0–10.5)

## 2012-02-12 LAB — COMPREHENSIVE METABOLIC PANEL
ALT: 19 U/L (ref 0–35)
AST: 27 U/L (ref 0–37)
Albumin: 3.8 g/dL (ref 3.5–5.2)
Alkaline Phosphatase: 71 U/L (ref 39–117)
BUN: 18 mg/dL (ref 6–23)
CO2: 24 mEq/L (ref 19–32)
Calcium: 9.9 mg/dL (ref 8.4–10.5)
Chloride: 105 mEq/L (ref 96–112)
Creatinine, Ser: 1.11 mg/dL — ABNORMAL HIGH (ref 0.50–1.10)
GFR calc Af Amer: 54 mL/min — ABNORMAL LOW (ref >90)
GFR calc non Af Amer: 47 mL/min — ABNORMAL LOW (ref >90)
Glucose, Bld: 100 mg/dL — ABNORMAL HIGH (ref 70–99)
Potassium: 4.1 mEq/L (ref 3.5–5.1)
Sodium: 140 mEq/L (ref 135–145)
Total Bilirubin: 0.5 mg/dL (ref 0.3–1.2)
Total Protein: 7.3 g/dL (ref 6.0–8.3)

## 2012-02-12 LAB — LIPASE, BLOOD: Lipase: 89 U/L — ABNORMAL HIGH (ref 11–59)

## 2012-02-12 MED ORDER — PANTOPRAZOLE SODIUM 40 MG IV SOLR
40.0000 mg | Freq: Once | INTRAVENOUS | Status: AC
  Administered 2012-02-12: 40 mg via INTRAVENOUS
  Filled 2012-02-12: qty 40

## 2012-02-12 MED ORDER — GI COCKTAIL ~~LOC~~
30.0000 mL | Freq: Once | ORAL | Status: AC
  Administered 2012-02-12: 30 mL via ORAL
  Filled 2012-02-12: qty 30

## 2012-02-12 MED ORDER — PANTOPRAZOLE SODIUM 20 MG PO TBEC: 20.0000 mg | DELAYED_RELEASE_TABLET | Freq: Two times a day (BID) | ORAL | Status: DC

## 2012-02-12 MED ORDER — SODIUM CHLORIDE 0.9 % IV BOLUS (SEPSIS)
1000.0000 mL | Freq: Once | INTRAVENOUS | Status: AC
  Administered 2012-02-12: 1000 mL via INTRAVENOUS

## 2012-02-12 MED ORDER — ONDANSETRON HCL 4 MG/2ML IJ SOLN
4.0000 mg | Freq: Once | INTRAMUSCULAR | Status: AC
  Administered 2012-02-12: 4 mg via INTRAVENOUS
  Filled 2012-02-12: qty 2

## 2012-02-12 MED ORDER — ONDANSETRON HCL 4 MG PO TABS: 4.0000 mg | ORAL_TABLET | Freq: Four times a day (QID) | ORAL | Status: DC

## 2012-02-12 NOTE — ED Provider Notes (Signed)
History  This chart was scribed for [name], MD by Bennett Scrape. This patient was seen in room [facility] and the patient's care was started at 9:25PM.  CSN: [id number]  Arrival date & time 02/12/12  2025   First MD Initiated Contact with Patient 02/12/12 2125      No chief complaint on file.   The history is provided by the patient. No language interpreter was used.    [name] is a 78 y.o. female with a h/o GERD who presents to the Emergency Department complaining of 3 days of intermittent, non-radiating epigastric abdominal pain described as burning with associated non-bloody emesis and a metallic taste in her mouth. She reports one episode of emesis per day and states that the symptoms are worse after eating. She states that she has been taking Gaviscon, Tums and Pepcid with no improvement. She reports that the symptoms she is experiencing are similar to prior episodes of GERD. She denies fever, chills, diarrhea, HA and rash as associated symptoms. She also has a HTN, cholecystectomy and anxiety. She denies smoking and alcohol use.   Past Medical History  Diagnosis Date  . Heart palpitations     PAC's   . Hypertension   . Anxiety   . GERD (gastroesophageal reflux disease)   . Helicobacter pylori (H. pylori)     treated in 2009  . Bladder cancer     Past Surgical History  Procedure Date  . Cholecystectomy 1998  . Bladder surgey 2009  . Cataract extraction 2 years ago    both eyes  . Esophagogastroduodenoscopy 10/19/2011    Procedure: ESOPHAGOGASTRODUODENOSCOPY (EGD);  Surgeon: Malissa Hippo, MD;  Location: [facility];  Service: Endoscopy;  Laterality: N/A;  730    Family History  Problem Relation Age of Onset  . Hypertension Mother   . Diabetes Mother   . Pneumonia Mother   . Diabetes Father   . Stroke Sister   . Stroke Brother   . Hypertension Daughter   . Obesity Daughter   . Diabetes Daughter     History  Substance Use Topics  .  Smoking status: Never Smoker   . Smokeless tobacco: Never Used  . Alcohol Use: No    No OB history provided.  Review of Systems  Constitutional: Negative for fever and chills.  Gastrointestinal: Positive for nausea, vomiting and abdominal pain. Negative for diarrhea.  All other systems reviewed and are negative.    Allergies  Codeine and Sulfonamide derivatives  Home Medications   Current Outpatient Rx  Name Route Sig Dispense Refill  . EXCEDRIN ASPIRIN FREE PO Oral Take 1 tablet by mouth daily as needed. For pain    . ALPRAZOLAM 0.5 MG PO TABS  One tablet once daily, as needed for anxiety or sleep, Ten tablets to last 45 days or longer 10 tablet 1  . ALUM HYDROXIDE-MAG CARBONATE 31.7-137 MG/5ML PO SUSP Oral Take 5 mLs by mouth every 6 (six) hours as needed. For upset stomach    . AMLODIPINE BESY-BENAZEPRIL HCL 5-20 MG PO CAPS Oral Take 1 capsule by mouth daily. 30 capsule 3    Stop lotrel 10/20, start lotrel 5/20 effective 9/1 ...  . ATENOLOL 25 MG PO TABS Oral Take 25 mg by mouth every morning.    Marland Kitchen CALCIUM-VITAMIN D 500 MG PO TABS Oral Take 1 tablet by mouth daily.     Marland Kitchen FAMOTIDINE 20 MG PO TABS Oral Take 20 mg by mouth daily.    Marland Kitchen  MEGESTROL ACETATE 40 MG/ML PO SUSP Oral Take 40 mg by mouth daily. Take 1 teaspoonful ( ) once daily    . ADULT MULTIVITAMIN W/MINERALS CH Oral Take 1 tablet by mouth daily.    Marland Kitchen POLYETHYL GLYCOL-PROPYL GLYCOL 0.4-0.3 % OP SOLN Both Eyes Place 2 drops into both eyes daily as needed. FOR IRRITATION     . POLYVINYL ALCOHOL 1.4 % OP SOLN Both Eyes Place 1 drop into both eyes as needed. For dry eyes    . CALCIUM CARBONATE ANTACID 500 MG PO CHEW Oral Chew 2 tablets by mouth as needed. For heartburn      Triage Vitals: BP 124/97  Pulse 68  Temp 98.6 F (37 C) (Oral)  Resp 18  Ht 5\' 3"  (1.6 m)  Wt 115 lb (52.164 kg)  BMI 20.37 kg/m2  SpO2 100%  Physical Exam  Nursing note and vitals reviewed. Constitutional: She is oriented to person, place,  and time. She appears well-developed and well-nourished.  HENT:  Head: Normocephalic and atraumatic.  Eyes: Conjunctivae normal and EOM are normal. Pupils are equal, round, and reactive to light.  Neck: Normal range of motion. Neck supple.  Cardiovascular: Normal rate, regular rhythm and normal heart sounds.   Pulmonary/Chest: Effort normal and breath sounds normal. No respiratory distress. She has no wheezes. She has no rales.  Abdominal: Soft. Bowel sounds are normal. She exhibits no distension. There is tenderness (epigastrium tenderness). There is no rebound and no guarding.  Musculoskeletal: Normal range of motion.  Neurological: She is alert and oriented to person, place, and time.  Skin: Skin is warm and dry.  Psychiatric: She has a normal mood and affect.    ED Course  Procedures (including critical care time)  DIAGNOSTIC STUDIES: Oxygen Saturation is 100% on room air, normal by my interpretation.    COORDINATION OF CARE: 9:53PM-Discussed treatment plan which includes pain medications and blood work with pt at bedside and pt agreed to plan.  10:15PM-Ordered 1000 mL Bolus, 4 mg Zofran, 40 mg Protonix and a GI cocktail   Labs Reviewed - No data to display No results found.   1. Abdominal pain   2. Nausea and vomiting       MDM  78 year old female with nausea vomiting and abdominal pain. She does have tenderness in her epigastrium on exam but she's not peritoneal. Patient attributes her symptoms to reflux which they may be. Workup was significant for an elevated lipase though. Discussed with patient and her mother that this can potentially be pancreatitis. She states that her pain is improved. She would like to go home. She's not had any vomiting in the emergency room. I feel that is reasonable at this time to be discharged but strict return precautions were discussed. Patient is being discharged with a PPI and anti-emetics. Outpatient followup otherwise with her PCP.  I  personally preformed the services scribed in my presence. The recorded information has been reviewed and considered. [name], MD.        [name], MD 02/12/12 2329

## 2012-02-12 NOTE — ED Notes (Signed)
Patient to [facility]. Family at bedside to assist patient. No other needs voiced at this time.

## 2012-02-12 NOTE — Telephone Encounter (Signed)
[name] said [name] is vomiting and gurgling again. Advised her I speak with [name] the nurse and Dr. Karilyn Cota then give her a call back. May not be until tomorrow. The return phone number is [phone].

## 2012-02-12 NOTE — Telephone Encounter (Signed)
Marylene Land advised - voices understood. Marylene Land will call back tomorrow about an apt with Dorene Ar, NP.

## 2012-02-12 NOTE — Telephone Encounter (Signed)
Per Dr.Rehman patient may be given an appointment to see Delrae Rend this week. Patient should hold off on liquids for a day . If the vomiting gets excessive go to the ED for evaluation

## 2012-02-12 NOTE — ED Notes (Signed)
Pt says vomiting and acid reflux since Sunday,  No diarrhea.  Epigastric pain, Pt says it feels like reflux she has had in the past.

## 2012-02-15 ENCOUNTER — Ambulatory Visit (INDEPENDENT_AMBULATORY_CARE_PROVIDER_SITE_OTHER): Payer: Medicare Other | Admitting: Family Medicine

## 2012-02-15 ENCOUNTER — Encounter: Payer: Self-pay | Admitting: Family Medicine

## 2012-02-15 VITALS — BP 134/72 | HR 73 | Resp 18 | Ht 60.0 in | Wt 117.0 lb

## 2012-02-15 DIAGNOSIS — R109 Unspecified abdominal pain: Secondary | ICD-10-CM

## 2012-02-15 DIAGNOSIS — K219 Gastro-esophageal reflux disease without esophagitis: Secondary | ICD-10-CM

## 2012-02-15 DIAGNOSIS — R51 Headache: Secondary | ICD-10-CM

## 2012-02-15 NOTE — Patient Instructions (Signed)
Change to regular tylenol  Bland foods Cut back to protonix once a day Get the lipase drawn  Keep previous F/U appt

## 2012-02-16 DIAGNOSIS — R109 Unspecified abdominal pain: Secondary | ICD-10-CM | POA: Insufficient documentation

## 2012-02-16 LAB — LIPASE: Lipase: 94 U/L — ABNORMAL HIGH (ref 0–75)

## 2012-02-16 NOTE — Assessment & Plan Note (Signed)
MRI negative, common complaint for pt though she describes as soreness, this resolved with execedrin but she has been using daily,, she may be getting rebound from the caffiene associated, will change her to plain acetaminophen

## 2012-02-16 NOTE — Assessment & Plan Note (Signed)
Continue PPI ?

## 2012-02-16 NOTE — Progress Notes (Signed)
  Subjective:    Patient ID: [name], female    DOB: 1934-02-23, 78 y.o.   MRN: [id number]  HPI Pt seen in ER this week secondary to epigastric abdominal pain, diagnosed with GERD as pain resolved with GI cocktail and anti-emetic. Labs were unremarkable with exception of mildly elevated lipase. She admits to mild headache which is unchanged from her previous headaches, described as soreness on top of head, abdominal pain is improved, She has emesis with small amount of water this am but has been able to eat throughout the week.   Review of Systems - per above  GEN- denies fatigue, fever, weight loss,weakness, recent illness HEENT- denies eye drainage, change in vision, nasal discharge, CVS- denies chest pain, palpitations RESP- denies SOB, cough, wheeze ABD- + N/V, change in stools, +abd pain GU- denies dysuria, hematuria, dribbling, incontinence MSK- denies joint pain, muscle aches, injury Neuro- + headache, dizziness, syncope, seizure activity      Objective:   Physical Exam GEN- NAD, alert and oriented x3 HEENT- PERRL, EOMI, non injected sclera, pink conjunctiva, MMM, oropharynx clear,  Neck- Supple, no LAD CVS- RRR, no murmur RESP-CTAB ABD-NABS,soft,mild epigastric tenderness.ND, no rebound, no gaurding EXT- No edema Pulses- Radial, DP- 2+ Neuro- CNII-XII grossly in tact, no focal deficits        Assessment & Plan:

## 2012-02-16 NOTE — Assessment & Plan Note (Signed)
Improved with PPI and GI cocktail, she thinks twice a day doing on protonix is causing her head to hurt. Will have her continue protonix daily, discussed foods to avoid, I doubt pancreatitis based on exam, I will check another lipase to make sure this has not increase by large amount suggesting an underlying problem. She looks well.  Given red, flags we can hold on imaging at this time   Lipase 89> 94

## 2012-02-29 ENCOUNTER — Encounter: Payer: Medicare Other | Admitting: Family Medicine

## 2012-03-12 ENCOUNTER — Other Ambulatory Visit: Payer: Self-pay

## 2012-03-12 MED ORDER — ATENOLOL 25 MG PO TABS: 25.0000 mg | ORAL_TABLET | Freq: Every morning | ORAL | Status: DC

## 2012-03-27 ENCOUNTER — Ambulatory Visit (INDEPENDENT_AMBULATORY_CARE_PROVIDER_SITE_OTHER): Payer: Medicare Other | Admitting: Family Medicine

## 2012-03-27 ENCOUNTER — Encounter: Payer: Self-pay | Admitting: Family Medicine

## 2012-03-27 VITALS — BP 150/74 | HR 55 | Resp 16 | Ht 60.0 in | Wt 116.8 lb

## 2012-03-27 DIAGNOSIS — Z0000 Encounter for general adult medical examination without abnormal findings: Secondary | ICD-10-CM | POA: Insufficient documentation

## 2012-03-27 DIAGNOSIS — F0280 Dementia in other diseases classified elsewhere without behavioral disturbance: Secondary | ICD-10-CM | POA: Insufficient documentation

## 2012-03-27 DIAGNOSIS — F0390 Unspecified dementia without behavioral disturbance: Secondary | ICD-10-CM

## 2012-03-27 DIAGNOSIS — I10 Essential (primary) hypertension: Secondary | ICD-10-CM

## 2012-03-27 DIAGNOSIS — Z79899 Other long term (current) drug therapy: Secondary | ICD-10-CM

## 2012-03-27 LAB — RPR

## 2012-03-27 MED ORDER — BENAZEPRIL HCL 10 MG PO TABS: 10.0000 mg | ORAL_TABLET | Freq: Every day | ORAL | Status: DC

## 2012-03-27 MED ORDER — DONEPEZIL HCL 5 MG PO TABS: 5.0000 mg | ORAL_TABLET | Freq: Every day | ORAL | Status: DC

## 2012-03-27 NOTE — Patient Instructions (Addendum)
F/U in 7 weeks.  New additional medication for blood pressure, and also for memory  B12 and RPR today in lab  It is important that you exercise regularly at least 30 minutes 5 times a week. If you develop chest pain, have severe difficulty breathing, or feel very tired, stop exercising immediately and seek medical attention

## 2012-03-27 NOTE — Assessment & Plan Note (Signed)
Under corrected, add additional benazepril 10 mg tablet continue others as before

## 2012-03-27 NOTE — Assessment & Plan Note (Signed)
Check B12 and RPR levels as part of workup, has had  MRI brain in the past 6 months

## 2012-03-27 NOTE — Assessment & Plan Note (Signed)
annual wellness exam performed and documented. Pt has significant memory loss, failed mini cog test, and scored 10/30 on MMSE. Recent MRI brain no major abnormality, will check RPR and B12 levels, she is to start aricept also Spoke with daughter who became very emotional though she was aware her Mom was forgetting things, the use of the word dementia got her upset, but she was understanding based on Mother's poor performance. On standardized testing. Pt chooses to ambulate with a cane though she has had no falls Otherwise her level of functioning is age appropriate. End of life issues were not openly discussed at this visit due to new dx of dementia being addressed, but based on care expectations, I am sure she is a full code with all possible beneficial medical care to be offered

## 2012-03-27 NOTE — Progress Notes (Signed)
Subjective:    Patient ID: [name], female    DOB: Dec 06, 1933, 78 y.o.   MRN: [id number]  HPI Preventive Screening-Counseling & Management   Patient present here today for a Medicare annual wellness visit.   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 No  Over the past two weeks, have you felt little interest or pleasure in doing things? No  Have you lost interest or pleasure in daily life? No  Do you often feel hopeless? No  Do you cry easily over simple problems? No   Activities of Daily Living  In your present state of health, do you have any difficulty performing the following activities?  Driving?: unsure Managing money?: yes somewhat confused  Feeding yourself?:No Getting from bed to chair?:No Climbing a flight of stairs?:No Preparing food and eating?:No Bathing or showering?:No Getting dressed?:No Getting to the toilet?:No Using the toilet?:No Moving around from place to place?: No  Fall Risk Assessment In the past year have you fallen or had a near fall?:No, ambulates with cane over 75% of the time Are you currently taking any medications that make you dizziness?:No   Hearing Difficulties: No Do you often ask people to speak up or repeat themselves?:No Do you experience ringing or noises in your ears?:No Do you have difficulty understanding soft or whispered voices?:No  Cognitive Testing  Alert? Yes Normal Appearance?Yes  Oriented to person? Yes Place?  Yes  Time? no  Displays appropriate judgment?Yes  Can read the correct time from a watch face? no Are you having problems remembering things?yes  MMSE 10/30 on 03/27/2012  Advanced Directives have been discussed with the patient?no will do at future visit, full code satatus at this time I am certain with all possible interventions to help pt  Being offered   List the Names of Other Physician/Practitioners you currently use: Dr Diona Browner, , Jerre Simon, Rehman,      Assessment:    Annual Wellness Exam   Plan:    During the course of the visit the patient was educated and counseled about appropriate screening and preventive services including:  A healthy diet is rich in fruit, vegetables and whole grains. Poultry fish, nuts and beans are a healthy choice for protein rather then red meat. A low sodium diet and drinking 64 ounces of water daily is generally recommended. Oils and sweet should be limited. Carbohydrates especially for those who are diabetic or overweight, should be limited to 30-45 gram per meal. It is important to eat on a regular schedule, at least 3 times daily. Snacks should be primarily fruits, vegetables or nuts. It is important that you exercise regularly at least 30 minutes 5 times a week. If you develop chest pain, have severe difficulty breathing, or feel very tired, stop exercising immediately and seek medical attention  Immunization reviewed and updated. Cancer screening reviewed and updated    Patient Instructions (the written plan) was given to the patient.  Medicare Attestation  I have personally reviewed:  The patient's medical and social history  Their use of alcohol, tobacco or illicit drugs  Their current medications and supplements  The patient's functional ability including ADLs,fall risks, home safety risks, cognitive, and hearing and visual impairment  Diet and physical activities  Evidence for depression or mood disorders  The patient's weight,  height, BMI, and visual acuity have been recorded in the chart. I have made referrals, counseling, and provided education to the patient based on review of the above and I have provided the patient with a written personalized care plan for preventive services.      Review of Systems     Objective:   Physical Exam        Assessment & Plan:

## 2012-04-11 ENCOUNTER — Other Ambulatory Visit: Payer: Self-pay | Admitting: Family Medicine

## 2012-04-18 ENCOUNTER — Encounter: Payer: Self-pay | Admitting: Family Medicine

## 2012-04-18 ENCOUNTER — Ambulatory Visit (INDEPENDENT_AMBULATORY_CARE_PROVIDER_SITE_OTHER): Payer: Medicare Other | Admitting: Family Medicine

## 2012-04-18 VITALS — BP 140/70 | HR 77 | Resp 18 | Ht 60.0 in | Wt 115.0 lb

## 2012-04-18 DIAGNOSIS — K219 Gastro-esophageal reflux disease without esophagitis: Secondary | ICD-10-CM

## 2012-04-18 DIAGNOSIS — R51 Headache: Secondary | ICD-10-CM

## 2012-04-18 DIAGNOSIS — E785 Hyperlipidemia, unspecified: Secondary | ICD-10-CM

## 2012-04-18 DIAGNOSIS — F0390 Unspecified dementia without behavioral disturbance: Secondary | ICD-10-CM

## 2012-04-18 DIAGNOSIS — I10 Essential (primary) hypertension: Secondary | ICD-10-CM

## 2012-04-18 NOTE — Patient Instructions (Addendum)
F/U in 2 month  Happy holidays, and safe travel   Blood pressure is much improved, and am happy that you are taking your new med with no problems

## 2012-04-18 NOTE — Progress Notes (Signed)
  Subjective:    Patient ID: [name], female    DOB: [birth year], 78 y.o.   MRN: [id number]  HPI The PT is here for follow up and re-evaluation of chronic medical conditions, medication management and review of any available recent lab and radiology data. Specifically for re eval of blood pressure and f/u after recently starting aricept. No problems noted with either med adjustment, and blood pressures at home are much improved, the meter is also here at the visit Preventive health is updated, specifically  Cancer screening and Immunization.   Questions or concerns regarding consultations or procedures which the PT has had in the interim are  addressed. The PT denies any adverse reactions to current medications since the last visit.  There are no new concerns.  There are no specific complaints except chronic occipital headache, no localized weakness or numbness, tylenol relieves, no change      Review of Systems See HPI Denies recent fever or chills. Denies sinus pressure, nasal congestion, ear pain or sore throat. Denies chest congestion, productive cough or wheezing. Denies chest pains, palpitations and leg swelling Denies abdominal pain, nausea, vomiting,diarrhea or constipation.   Denies dysuria, frequency, hesitancy or incontinence. Denies joint pain, swelling and limitation in mobility. Denies seizures, numbness, or tingling. Denies depression, anxiety or insomnia. Denies skin break down or rash.        Objective:   Physical Exam  Patient alert and oriented and in no cardiopulmonary distress.  HEENT: No facial asymmetry, EOMI, no sinus tenderness,  oropharynx pink and moist.  Neck supple no adenopathy.  Chest: Clear to auscultation bilaterally.  CVS: S1, S2 no murmurs, no S3.  ABD: Soft non tender. Bowel sounds normal.  Ext: No edema  MS: Adequate ROM spine, shoulders, hips and knees.  Skin: Intact, no ulcerations or rash noted.  Psych: Good eye  contact, normal affect. Memory loss not anxious or depressed appearing.  CNS: CN 2-12 intact, power, tone and sensation normal throughout.       Assessment & Plan:

## 2012-04-19 NOTE — Assessment & Plan Note (Signed)
Controlled, no change in medication  

## 2012-04-19 NOTE — Assessment & Plan Note (Signed)
Non specific, vague chronic complaint. Non focal exam, recent brain scan normal except atrophy, pt reassured

## 2012-04-19 NOTE — Assessment & Plan Note (Signed)
No adverse s/e from medication noted,pt to continue current dose for an additional 6 to 10 weeks

## 2012-04-19 NOTE — Assessment & Plan Note (Signed)
Improved and adequately controlled, continue current meds

## 2012-05-15 ENCOUNTER — Ambulatory Visit: Payer: Medicare Other | Admitting: Family Medicine

## 2012-06-18 ENCOUNTER — Ambulatory Visit (INDEPENDENT_AMBULATORY_CARE_PROVIDER_SITE_OTHER): Payer: Medicare Other | Admitting: Family Medicine

## 2012-06-18 ENCOUNTER — Encounter: Payer: Self-pay | Admitting: Family Medicine

## 2012-06-18 VITALS — BP 118/80 | HR 84 | Resp 16 | Ht 60.0 in | Wt 114.0 lb

## 2012-06-18 DIAGNOSIS — I10 Essential (primary) hypertension: Secondary | ICD-10-CM

## 2012-06-18 DIAGNOSIS — F0390 Unspecified dementia without behavioral disturbance: Secondary | ICD-10-CM

## 2012-06-18 DIAGNOSIS — E559 Vitamin D deficiency, unspecified: Secondary | ICD-10-CM

## 2012-06-18 DIAGNOSIS — D649 Anemia, unspecified: Secondary | ICD-10-CM

## 2012-06-18 DIAGNOSIS — R51 Headache: Secondary | ICD-10-CM

## 2012-06-18 DIAGNOSIS — R112 Nausea with vomiting, unspecified: Secondary | ICD-10-CM

## 2012-06-18 DIAGNOSIS — E785 Hyperlipidemia, unspecified: Secondary | ICD-10-CM

## 2012-06-18 DIAGNOSIS — F419 Anxiety disorder, unspecified: Secondary | ICD-10-CM

## 2012-06-18 DIAGNOSIS — G4700 Insomnia, unspecified: Secondary | ICD-10-CM

## 2012-06-18 DIAGNOSIS — R1013 Epigastric pain: Secondary | ICD-10-CM

## 2012-06-18 DIAGNOSIS — F411 Generalized anxiety disorder: Secondary | ICD-10-CM

## 2012-06-18 DIAGNOSIS — R5381 Other malaise: Secondary | ICD-10-CM

## 2012-06-18 DIAGNOSIS — K219 Gastro-esophageal reflux disease without esophagitis: Secondary | ICD-10-CM

## 2012-06-18 MED ORDER — DONEPEZIL HCL 10 MG PO TABS: 10.0000 mg | ORAL_TABLET | Freq: Every day | ORAL | Status: DC

## 2012-06-18 NOTE — Patient Instructions (Addendum)
F/u in 4 month, call if you need me before please.  Weight is excellent.  Blood pressure is excellent.  Dose increase on aricept to 10mg  daily, OK to take TWO 5mg  tablets daily till done  Please increase pantoprazole to tWICE daily , as prescribed, this will likely help your stomach pain.  Start calcium with D GEL capsule 1200 mg/1000IU once daily for bone health please  Make f/u appt with Dr Karilyn Cota please   Fasting lipid, chem 7, TSH and Vit D in 4 month, before visit

## 2012-06-18 NOTE — Assessment & Plan Note (Signed)
Currently uncontrolled, likely due to under dosing of PPI unintentionally by daughter. Has gI f/u next month

## 2012-06-18 NOTE — Assessment & Plan Note (Signed)
Uncontrolled, inadvertently pt has not been getting the PPI as prescribed, this may aleviate her symptom of abdominal pain and regurgitation

## 2012-06-18 NOTE — Progress Notes (Signed)
  Subjective:    Patient ID: [name], female    DOB: November 16, 1933, 79 y.o.   MRN: [id number]  HPI 2 week h/o epigastric pain with eating despite bland diet, associated with clear regurgitation, on med review, unfortunately, she was receiving protonix once daily, and not twice daily, which is the major likely contributor No intolerance to aricept noted by pt or daughter, hence dose is beoing increased to standard treating dose Appetite is good and weight is maintained without megace   Review of Systems See HPI Denies recent fever or chills. Denies sinus pressure, nasal congestion, ear pain or sore throat. Denies chest congestion, productive cough or wheezing. Denies chest pains, palpitations and leg swelling   Denies dysuria, frequency, hesitancy or incontinence. Denies joint pain, swelling and limitation in mobility. Denies headaches, seizures, numbness, or tingling. Denies depression, anxiety or insomnia. Denies skin break down or rash.         Objective:   Physical Exam Patient alert and oriented and in no cardiopulmonary distress.  HEENT: No facial asymmetry, EOMI, no sinus tenderness,  oropharynx pink and moist.  Neck supple no adenopathy.  Chest: Clear to auscultation bilaterally.  CVS: S1, S2 no murmurs, no S3.  ABD: Soft non tender. Bowel sounds normal.  Ext: No edema  MS: Adequate ROM spine, shoulders, hips and knees.  Skin: Intact, no ulcerations or rash noted.  Psych: Good eye contact, normal affect.  not anxious or depressed appearing.  CNS: CN 2-12 intact, power, tone and sensation normal throughout.        Assessment & Plan:

## 2012-06-18 NOTE — Assessment & Plan Note (Signed)
Improved, no longer on xanax

## 2012-06-18 NOTE — Assessment & Plan Note (Signed)
Dose increased to standard treating dose, will review memory next visit. No intolerance to medication stated

## 2012-06-18 NOTE — Assessment & Plan Note (Signed)
Sleep hygiene reviewed, continue med 

## 2012-06-18 NOTE — Assessment & Plan Note (Signed)
Occasional , max approx 2 per month

## 2012-06-18 NOTE — Assessment & Plan Note (Signed)
Controlled, no change in medication DASH diet and commitment to daily physical activity for a minimum of 30 minutes discussed and encouraged, as a part of hypertension management. The importance of attaining a healthy weight is also discussed.  

## 2012-06-18 NOTE — Assessment & Plan Note (Signed)
2 week h/o increased pain 

## 2012-06-19 ENCOUNTER — Ambulatory Visit: Payer: Medicare Other | Admitting: Family Medicine

## 2012-07-14 ENCOUNTER — Ambulatory Visit (INDEPENDENT_AMBULATORY_CARE_PROVIDER_SITE_OTHER): Payer: Medicare PPO | Admitting: Internal Medicine

## 2012-07-14 ENCOUNTER — Encounter (INDEPENDENT_AMBULATORY_CARE_PROVIDER_SITE_OTHER): Payer: Self-pay | Admitting: Internal Medicine

## 2012-07-14 VITALS — BP 110/70 | HR 68 | Temp 98.2°F | Resp 16 | Ht 62.0 in | Wt 114.9 lb

## 2012-07-14 DIAGNOSIS — R1013 Epigastric pain: Secondary | ICD-10-CM

## 2012-07-14 DIAGNOSIS — K219 Gastro-esophageal reflux disease without esophagitis: Secondary | ICD-10-CM

## 2012-07-14 DIAGNOSIS — R634 Abnormal weight loss: Secondary | ICD-10-CM

## 2012-07-14 NOTE — Progress Notes (Signed)
Presenting complaint;  Followup for epigastric pain and weight loss. Subjective:  Patient is 79 year old African female who is here for scheduled visit accompanied by her daughter Marylene Land. She was last seen in September 2013. She states she is feeling much better. She has a good appetite. She hasn't had much epigastric pain since her last visit. She is watching her diet and eats early in the evening. She has gained another 5 pounds since her last visit. She would like to see her weight to 120 pounds. She denies heartburn nausea or regurgitation. She is having intermittent constipation and using MOM usually once a week. She is trying to get some exercise by walking inside the house. She denies melena or rectal bleeding.  Current Medications: Current Outpatient Prescriptions  Medication Sig Dispense Refill  . Acetaminophen-Caffeine (EXCEDRIN ASPIRIN FREE PO) Take 1 tablet by mouth daily as needed. For pain      . aluminum hydroxide-magnesium carbonate (GAVISCON) 31.7-137 MG/5ML suspension Take 5 mLs by mouth every 6 (six) hours as needed. For upset stomach      . atenolol (TENORMIN) 25 MG tablet Take 1 tablet (25 mg total) by mouth every morning.  30 tablet  2  . benazepril (LOTENSIN) 10 MG tablet Take 1 tablet (10 mg total) by mouth daily.  30 tablet  11  . calcium carbonate (TUMS - DOSED IN MG ELEMENTAL CALCIUM) 500 MG chewable tablet Chew 2 tablets by mouth as needed. For heartburn      . donepezil (ARICEPT) 10 MG tablet Take 1 tablet (10 mg total) by mouth at bedtime.  30 tablet  5  . LOTREL 5-20 MG per capsule TAKE 1 CAPSULE BY MOUTH ONCE DAILY.  30 capsule  3  . Multiple Vitamin (MULITIVITAMIN WITH MINERALS) TABS Take 1 tablet by mouth daily.      . pantoprazole (PROTONIX) 20 MG tablet Take 1 tablet (20 mg total) by mouth 2 (two) times daily.  60 tablet  3  . polyvinyl alcohol (ARTIFICIAL TEARS) 1.4 % ophthalmic solution Place 1 drop into both eyes as needed. For dry eyes       No current  facility-administered medications for this visit.     Objective: Blood pressure 110/70, pulse 68, temperature 98.2 F (36.8 C), temperature source Oral, resp. rate 16, height 5\' 2"  (1.575 m), weight 114 lb 14.4 oz (52.118 kg). Patient is alert and in no acute distress. Conjunctiva is pink. Sclera is nonicteric Oropharyngeal mucosa is normal. No neck masses or thyromegaly noted. Abdomen is flat and soft without tenderness organomegaly or masses.  No LE edema or clubbing noted.    Assessment:  #1. Epigastric pain/chronic dyspepsia. Patient is doing much better with current therapy presently reluctant to drop her PPI dose. #2. GERD. She is doing well with therapy. #3. Weight loss. She has gained almost 8 pounds in the last one year which is very reassuring. #4. Constipation.   Plan:  Continue pantoprazole at current dose. Colace 20 mg by mouth each bedtime. Office visit in 6 months.

## 2012-07-14 NOTE — Patient Instructions (Signed)
Notify if epigastric pain recurs

## 2012-07-23 ENCOUNTER — Emergency Department (HOSPITAL_COMMUNITY)
Admission: EM | Admit: 2012-07-23 | Discharge: 2012-07-23 | Disposition: A | Discharge status: Home / Self Care | Payer: Medicare PPO | Attending: Emergency Medicine | Admitting: Emergency Medicine

## 2012-07-23 ENCOUNTER — Emergency Department (HOSPITAL_COMMUNITY): Payer: Medicare PPO

## 2012-07-23 ENCOUNTER — Encounter (HOSPITAL_COMMUNITY): Payer: Self-pay

## 2012-07-23 DIAGNOSIS — R1013 Epigastric pain: Secondary | ICD-10-CM | POA: Insufficient documentation

## 2012-07-23 DIAGNOSIS — K219 Gastro-esophageal reflux disease without esophagitis: Secondary | ICD-10-CM | POA: Insufficient documentation

## 2012-07-23 DIAGNOSIS — Z8551 Personal history of malignant neoplasm of bladder: Secondary | ICD-10-CM | POA: Insufficient documentation

## 2012-07-23 DIAGNOSIS — Z8659 Personal history of other mental and behavioral disorders: Secondary | ICD-10-CM | POA: Insufficient documentation

## 2012-07-23 DIAGNOSIS — I10 Essential (primary) hypertension: Secondary | ICD-10-CM | POA: Insufficient documentation

## 2012-07-23 DIAGNOSIS — Z79899 Other long term (current) drug therapy: Secondary | ICD-10-CM | POA: Insufficient documentation

## 2012-07-23 DIAGNOSIS — Z8619 Personal history of other infectious and parasitic diseases: Secondary | ICD-10-CM | POA: Insufficient documentation

## 2012-07-23 DIAGNOSIS — Z8679 Personal history of other diseases of the circulatory system: Secondary | ICD-10-CM | POA: Insufficient documentation

## 2012-07-23 DIAGNOSIS — R112 Nausea with vomiting, unspecified: Secondary | ICD-10-CM

## 2012-07-23 LAB — CBC WITH DIFFERENTIAL/PLATELET
Eosinophils Absolute: 0 10*3/uL (ref 0.0–0.7)
Eosinophils Relative: 1 % (ref 0–5)
HCT: 39.2 % (ref 36.0–46.0)
Lymphs Abs: 1.2 10*3/uL (ref 0.7–4.0)
MCH: 32.2 pg (ref 26.0–34.0)
MCV: 92 fL (ref 78.0–100.0)
Monocytes Absolute: 0.3 10*3/uL (ref 0.1–1.0)
Platelets: 243 10*3/uL (ref 150–400)
RBC: 4.26 MIL/uL (ref 3.87–5.11)
RDW: 13.3 % (ref 11.5–15.5)

## 2012-07-23 LAB — BASIC METABOLIC PANEL
Calcium: 9.2 mg/dL (ref 8.4–10.5)
Creatinine, Ser: 0.84 mg/dL (ref 0.50–1.10)
GFR calc non Af Amer: 65 mL/min — ABNORMAL LOW (ref >90)
Glucose, Bld: 124 mg/dL — ABNORMAL HIGH (ref 70–99)
Sodium: 139 mEq/L (ref 135–145)

## 2012-07-23 MED ORDER — ALUM & MAG HYDROXIDE-SIMETH 200-200-20 MG/5ML PO SUSP
30.0000 mL | Freq: Once | ORAL | Status: AC
  Administered 2012-07-23: 30 mL via ORAL
  Filled 2012-07-23: qty 30

## 2012-07-23 MED ORDER — ONDANSETRON HCL 4 MG/2ML IJ SOLN
4.0000 mg | Freq: Once | INTRAMUSCULAR | Status: AC
  Administered 2012-07-23: 4 mg via INTRAVENOUS
  Filled 2012-07-23: qty 2

## 2012-07-23 MED ORDER — SODIUM CHLORIDE 0.9 % IV BOLUS (SEPSIS)
500.0000 mL | Freq: Once | INTRAVENOUS | Status: AC
  Administered 2012-07-23: 500 mL via INTRAVENOUS

## 2012-07-23 MED ORDER — ONDANSETRON 4 MG PO TBDP: 4.0000 mg | ORAL_TABLET | Freq: Three times a day (TID) | ORAL | Status: DC | PRN

## 2012-07-23 MED ORDER — ACETAMINOPHEN 325 MG PO TABS
650.0000 mg | ORAL_TABLET | Freq: Once | ORAL | Status: AC
  Administered 2012-07-23: 650 mg via ORAL
  Filled 2012-07-23: qty 2

## 2012-07-23 MED ORDER — PANTOPRAZOLE SODIUM 40 MG PO TBEC
40.0000 mg | DELAYED_RELEASE_TABLET | Freq: Once | ORAL | Status: AC
  Administered 2012-07-23: 40 mg via ORAL
  Filled 2012-07-23: qty 1

## 2012-07-23 NOTE — ED Notes (Signed)
Upper abd pain and vomiting started today

## 2012-07-23 NOTE — ED Provider Notes (Signed)
History     CSN: [id number]  Arrival date & time 07/23/12  0008   First MD Initiated Contact with Patient 07/23/12 [phone]      Chief Complaint  Patient presents with  . Abdominal Pain  . Emesis   patient presents with her daughter who is a good historian.  (Consider location/radiation/quality/duration/timing/severity/associated sxs/prior treatment) HPI [name] is donepezil 79 y.o. female history of dementia who recently had an increase in her dose of donepezil (and had not been taking it regularly) also a history of GERD presents with epigastric pain nausea and vomiting that started yesterday.  Patient is on a fairly normal schedule typically eats dinner which is light to 7 white, and toast about 5:00. Patient's pain started yesterday has been 10 out of 10, intermittent, is described as "a pain", the pain is worse at night and worse when laying down, she still taking protonic she takes approximately 2 hours before she lays down. She denies any chest pain, shortness of breath, diaphoresis, diarrhea, fevers, chills, ankle swelling, rash, joint aches. No recent sick contacts. She has had some dizziness today she notices this on changing position from laying to sitting up.  Past Medical History  Diagnosis Date  . Heart palpitations     PAC's   . Hypertension   . Anxiety   . GERD (gastroesophageal reflux disease)   . Helicobacter pylori (H. pylori)     treated in 2009  . Bladder cancer     Past Surgical History  Procedure Laterality Date  . Cholecystectomy  1998  . Bladder surgey  2009  . Cataract extraction  2 years ago    both eyes  . Esophagogastroduodenoscopy  10/19/2011    Procedure: ESOPHAGOGASTRODUODENOSCOPY (EGD);  Surgeon: Malissa Hippo, MD;  Location: [facility];  Service: Endoscopy;  Laterality: N/A;  730    Family History  Problem Relation Age of Onset  . Hypertension Mother   . Diabetes Mother   . Pneumonia Mother   . Diabetes Father   . Stroke Sister    . Stroke Brother   . Hypertension Daughter   . Obesity Daughter   . Diabetes Daughter     History  Substance Use Topics  . Smoking status: Never Smoker   . Smokeless tobacco: Never Used  . Alcohol Use: No    OB History   Grav Para Term Preterm Abortions TAB SAB Ect Mult Living                  Review of Systems At least 10pt or greater review of systems completed and are negative except where specified in the HPI.  Allergies  Codeine and Sulfonamide derivatives  Home Medications   Current Outpatient Rx  Name  Route  Sig  Dispense  Refill  . Acetaminophen-Caffeine (EXCEDRIN ASPIRIN FREE PO)   Oral   Take 1 tablet by mouth daily as needed. For pain         . aluminum hydroxide-magnesium carbonate (GAVISCON) 31.7-137 MG/5ML suspension   Oral   Take 5 mLs by mouth every 6 (six) hours as needed. For upset stomach         . atenolol (TENORMIN) 25 MG tablet   Oral   Take 1 tablet (25 mg total) by mouth every morning.   30 tablet   2   . benazepril (LOTENSIN) 10 MG tablet   Oral   Take 1 tablet (10 mg total) by mouth daily.   30 tablet  11   . calcium carbonate (TUMS - DOSED IN MG ELEMENTAL CALCIUM) 500 MG chewable tablet   Oral   Chew 2 tablets by mouth as needed. For heartburn         . docusate sodium (COLACE) 100 MG capsule   Oral   Take 100 mg by mouth 2 (two) times daily.         Marland Kitchen donepezil (ARICEPT) 10 MG tablet   Oral   Take 1 tablet (10 mg total) by mouth at bedtime.   30 tablet   5     Dose increase effective 06/18/2012   . LOTREL 5-20 MG per capsule      TAKE 1 CAPSULE BY MOUTH ONCE DAILY.   30 capsule   3   . Multiple Vitamin (MULITIVITAMIN WITH MINERALS) TABS   Oral   Take 1 tablet by mouth daily.         . pantoprazole (PROTONIX) 20 MG tablet   Oral   Take 1 tablet (20 mg total) by mouth 2 (two) times daily.   60 tablet   3   . polyvinyl alcohol (ARTIFICIAL TEARS) 1.4 % ophthalmic solution   Both Eyes   Place 1  drop into both eyes as needed. For dry eyes           BP 165/64  Pulse 83  Temp(Src) 98.1 F (36.7 C) (Oral)  Resp 18  Ht 5\' 1"  (1.549 m)  Wt 114 lb (51.71 kg)  BMI 21.55 kg/m2  SpO2 98%  Physical Exam  Nursing notes reviewed.  Electronic medical record reviewed. VITAL SIGNS:   Filed Vitals:   07/23/12 0033  BP: 165/64  Pulse: 83  Temp: 98.1 F (36.7 C)  TempSrc: Oral  Resp: 18  Height: 5\' 1"  (1.549 m)  Weight: 114 lb (51.71 kg)  SpO2: 98%   CONSTITUTIONAL: Awake, oriented, appears non-toxic HENT: Atraumatic, normocephalic, oral mucosa pink and moist, airway patent. Nares patent without drainage. External ears normal. EYES: Conjunctiva clear, EOMI, PERRLA NECK: Trachea midline, non-tender, supple CARDIOVASCULAR: Normal heart rate, Normal rhythm, No murmurs, rubs, gallops PULMONARY/CHEST: Clear to auscultation, no rhonchi, wheezes, or rales. Symmetrical breath sounds. Non-tender. ABDOMINAL: Non-distended, soft, non-tender - no rebound or guarding.  BS normal. NEUROLOGIC: Non-focal, moving all four extremities, no gross sensory or motor deficits. EXTREMITIES: No clubbing, cyanosis, or edema SKIN: Warm, Dry, No erythema, No rash  ED Course  Procedures (including critical care time)  Date: 07/23/2012  Rate: 95  Rhythm: normal sinus rhythm with sinus arrhythmia  QRS Axis: normal  Intervals: normal  ST/T Wave abnormalities: normal  Conduction Disutrbances: none  Narrative Interpretation: unremarkable no significant change compared with prior EKG dated 02/12/2012  Labs Reviewed  CBC WITH DIFFERENTIAL - Abnormal; Notable for the following:    Neutrophils Relative 79 (*)    All other components within normal limits  BASIC METABOLIC PANEL - Abnormal; Notable for the following:    Glucose, Bld 124 (*)    GFR calc non Af Amer 65 (*)    GFR calc Af Amer 76 (*)    All other components within normal limits  TROPONIN I   Dg Chest Port 1 View  07/23/2012  *RADIOLOGY  REPORT*  Clinical Data: Epigastric pain, nausea, and vomiting.  PORTABLE CHEST - 1 VIEW  Comparison: 08/11/2011  Findings: Pulmonary hyperinflation suggesting emphysema.  Normal heart size and pulmonary vascularity.  No focal airspace disease or consolidation in the lungs.  No blunting of costophrenic angles. No  pneumothorax.  Mediastinal contours appear intact.  No acute changes since previous study.  IMPRESSION: No evidence of active pulmonary disease.   Original Report Authenticated By: Burman Nieves, M.D.      1. Epigastric pain   2. Nausea and vomiting       MDM  EXIE CHRISMER is a 79 y.o. female presenting with epigastric pain nausea and vomiting. Have considered an atypical presentation of acute coronary syndrome in this demented lady, also consider exacerbation of gastroesophageal reflux disease, some acute viral gastritis versus side effects from her recently increased Aricept, of note she has not been taking Aricept because she does not like the side effects - her daughter has been giving her the increased dose just starting yesterday.   Patient's pain has been intermittent it started yesterday, EKG is nonacute, and a single troponin will satisfactorily eliminate any suggestion of ACS with this patient. We'll treat her with antiemetics, Maalox and Protonix.    Patient is feeling better after therapy, she says much better when she came in, she is no longer nauseous and has not vomited since she got her Zofran earlier today. Laboratory workup is unremarkable, chest x-ray is unremarkable.  The differential discussed, I think it is equally likely GERD versus medication side effects-patient's pain doesn't seem to get potentially violating down, we'll advised patient to discontinue Aricept at least for a few days until she talks to Dr. Lodema Hong, will also have the patient take her Protonix later in the day TUMS/Maalox/Gaviscon as needed.  I do not think the patient has any life-threatening  intrathoracic or intra-abdominal emergency at this time. Abdominal exam is completely unremarkable besides mild epigastric tenderness, she is nontoxic and afebrile.  I explained the diagnosis and have given explicit precautions to return to the ER including any other new or worsening symptoms. The patient and her caregiver understand and accept the medical plan as it's been dictated and I have answered their questions. Discharge instructions concerning home care and prescriptions have been given.  The patient is STABLE and is discharged to home in good condition.         Jones Skene, MD 07/23/12 [phone]

## 2012-07-25 ENCOUNTER — Encounter: Payer: Self-pay | Admitting: Family Medicine

## 2012-07-25 ENCOUNTER — Ambulatory Visit (INDEPENDENT_AMBULATORY_CARE_PROVIDER_SITE_OTHER): Payer: Medicare PPO | Admitting: Family Medicine

## 2012-07-25 VITALS — BP 138/68 | HR 78 | Resp 18 | Ht 60.0 in | Wt 113.0 lb

## 2012-07-25 DIAGNOSIS — I10 Essential (primary) hypertension: Secondary | ICD-10-CM

## 2012-07-25 DIAGNOSIS — E785 Hyperlipidemia, unspecified: Secondary | ICD-10-CM

## 2012-07-25 DIAGNOSIS — K219 Gastro-esophageal reflux disease without esophagitis: Secondary | ICD-10-CM

## 2012-07-25 DIAGNOSIS — R112 Nausea with vomiting, unspecified: Secondary | ICD-10-CM

## 2012-07-25 DIAGNOSIS — F0390 Unspecified dementia without behavioral disturbance: Secondary | ICD-10-CM

## 2012-07-25 MED ORDER — PANTOPRAZOLE SODIUM 20 MG PO TBEC: 20.0000 mg | DELAYED_RELEASE_TABLET | Freq: Two times a day (BID) | ORAL | Status: DC

## 2012-07-25 MED ORDER — AMLODIPINE BESY-BENAZEPRIL HCL 5-20 MG PO CAPS: ORAL_CAPSULE | ORAL | Status: DC

## 2012-07-25 NOTE — Patient Instructions (Addendum)
F/u as before.  I strongly recommend your involvement in a senior citizens group, and will provide information as to what is available in [place]  Pls call if you believe the pill for memory is bothering you, I believe this will be beneficial for you too take, so unless it is a real problem , I recommend strongly that you continue to take it.   Exam of abdomen today is normal and you are no longer heaving.  Call if pain in the abdomen continues please, I think current pain is due to excess nausea and vomit you recently had

## 2012-07-26 NOTE — Assessment & Plan Note (Signed)
Will re check MMSE next visit. Pt to continue med

## 2012-07-26 NOTE — Assessment & Plan Note (Signed)
Controlled, no change in medication  

## 2012-07-26 NOTE — Assessment & Plan Note (Signed)
Hyperlipidemia:Low fat diet discussed and encouraged.  Updated lab needed 

## 2012-07-26 NOTE — Progress Notes (Signed)
  Subjective:    Patient ID: [name], female    DOB: 21-May-1933, 79 y.o.   MRN: [id number]  HPI Pt in for Ed follow up where she presented approx 3 days ago with acute abdominal pain, with nausea and regurgitation of large quantities of clear fluid, there is no h/o change in bowel movement, fever or chill. Symptoms have subsequently resolved , except for mild epigastric discomfort, where she had been retching excessively. She is bothered by gERd , and has had GI evaluation in the last week Question is being asked whether symptoms may be related to aricept, however there is willingness to continue the med which is good , as pt is significantly symptomatic, though daughter espescialy appears in denial.  They will pursue adult day care options which will be good for both   Review of Systems See HPI Denies recent fever or chills. Denies sinus pressure, nasal congestion, ear pain or sore throat. Denies chest congestion, productive cough or wheezing. Denies chest pains, palpitations and leg swelling   Denies dysuria, frequency, hesitancy or incontinence. Denies joint pain, swelling and limitation in mobility. Denies headaches, seizures, numbness, or tingling. Denies depression, anxiety or insomnia. Denies skin break down or rash.        Objective:   Physical Exam  Patient alert and oriented and in no cardiopulmonary distress.  HEENT: No facial asymmetry, EOMI, no sinus tenderness,  oropharynx pink and moist.  Neck supple no adenopathy.  Chest: Clear to auscultation bilaterally.  CVS: S1, S2 no murmurs, no S3.  ABD: Soft mild epigastric tenderness, no guarding or rebound, no organomegaly or mases. Bowel sounds slightly hyperactive  Ext: No edema  MS: Adequate ROM spine, shoulders, hips and knees.  Skin: Intact, no ulcerations or rash noted.  Psych: Good eye contact, normal affect.  not anxious or depressed appearing.  CNS: CN 2-12 intact, power, tone and sensation  normal throughout.       Assessment & Plan:

## 2012-07-26 NOTE — Assessment & Plan Note (Signed)
Continue current med , sub optimal control

## 2012-07-26 NOTE — Assessment & Plan Note (Signed)
Resolved currently, though apparently a recurrent and ongoing problem

## 2012-08-12 ENCOUNTER — Encounter (HOSPITAL_COMMUNITY): Payer: Self-pay | Admitting: Emergency Medicine

## 2012-08-12 ENCOUNTER — Emergency Department (HOSPITAL_COMMUNITY)
Admission: EM | Admit: 2012-08-12 | Discharge: 2012-08-12 | Disposition: A | Discharge status: Home / Self Care | Payer: Medicare PPO | Attending: Emergency Medicine | Admitting: Emergency Medicine

## 2012-08-12 DIAGNOSIS — F411 Generalized anxiety disorder: Secondary | ICD-10-CM | POA: Insufficient documentation

## 2012-08-12 DIAGNOSIS — Z79899 Other long term (current) drug therapy: Secondary | ICD-10-CM | POA: Insufficient documentation

## 2012-08-12 DIAGNOSIS — Z8679 Personal history of other diseases of the circulatory system: Secondary | ICD-10-CM | POA: Insufficient documentation

## 2012-08-12 DIAGNOSIS — Z8619 Personal history of other infectious and parasitic diseases: Secondary | ICD-10-CM | POA: Insufficient documentation

## 2012-08-12 DIAGNOSIS — I10 Essential (primary) hypertension: Secondary | ICD-10-CM | POA: Insufficient documentation

## 2012-08-12 DIAGNOSIS — Z8551 Personal history of malignant neoplasm of bladder: Secondary | ICD-10-CM | POA: Insufficient documentation

## 2012-08-12 DIAGNOSIS — R112 Nausea with vomiting, unspecified: Secondary | ICD-10-CM | POA: Insufficient documentation

## 2012-08-12 DIAGNOSIS — K219 Gastro-esophageal reflux disease without esophagitis: Secondary | ICD-10-CM | POA: Insufficient documentation

## 2012-08-12 DIAGNOSIS — R51 Headache: Secondary | ICD-10-CM

## 2012-08-12 LAB — CBC
HCT: 41.2 % (ref 36.0–46.0)
MCH: 32 pg (ref 26.0–34.0)
MCHC: 34.2 g/dL (ref 30.0–36.0)
MCV: 93.6 fL (ref 78.0–100.0)
Platelets: 264 10*3/uL (ref 150–400)
RDW: 13.3 % (ref 11.5–15.5)
WBC: 7.6 10*3/uL (ref 4.0–10.5)

## 2012-08-12 LAB — BASIC METABOLIC PANEL
BUN: 22 mg/dL (ref 6–23)
Calcium: 9.7 mg/dL (ref 8.4–10.5)
Chloride: 102 mEq/L (ref 96–112)
Creatinine, Ser: 0.9 mg/dL (ref 0.50–1.10)
GFR calc Af Amer: 70 mL/min — ABNORMAL LOW (ref >90)
GFR calc non Af Amer: 60 mL/min — ABNORMAL LOW (ref >90)

## 2012-08-12 MED ORDER — ONDANSETRON HCL 4 MG/2ML IJ SOLN
4.0000 mg | Freq: Once | INTRAMUSCULAR | Status: AC
  Administered 2012-08-12: 4 mg via INTRAVENOUS
  Filled 2012-08-12: qty 2

## 2012-08-12 MED ORDER — HYDROMORPHONE HCL PF 1 MG/ML IJ SOLN
1.0000 mg | Freq: Once | INTRAMUSCULAR | Status: AC
  Administered 2012-08-12: 1 mg via INTRAVENOUS
  Filled 2012-08-12: qty 1

## 2012-08-12 MED ORDER — ONDANSETRON HCL 4 MG/2ML IJ SOLN: 4.0000 mg | Freq: Once | INTRAMUSCULAR | Status: DC

## 2012-08-12 MED ORDER — HYDROMORPHONE HCL PF 1 MG/ML IJ SOLN
0.5000 mg | Freq: Once | INTRAMUSCULAR | Status: AC
  Administered 2012-08-12: 0.5 mg via INTRAVENOUS
  Filled 2012-08-12: qty 1

## 2012-08-12 MED ORDER — PANTOPRAZOLE SODIUM 40 MG IV SOLR
40.0000 mg | Freq: Once | INTRAVENOUS | Status: AC
  Administered 2012-08-12: 40 mg via INTRAVENOUS
  Filled 2012-08-12: qty 40

## 2012-08-12 MED ORDER — SODIUM CHLORIDE 0.9 % IV SOLN
Freq: Once | INTRAVENOUS | Status: AC
  Administered 2012-08-12: 01:00:00 via INTRAVENOUS

## 2012-08-12 NOTE — ED Provider Notes (Addendum)
History     CSN: [id number]  Arrival date & time 08/12/12  0011   First MD Initiated Contact with Patient 08/12/12 0026      Chief Complaint  Patient presents with  . Headache  . Emesis    (Consider location/radiation/quality/duration/timing/severity/associated sxs/prior treatment) HPI Level 5 caveat due to dementia. Daughter is a good historian.  [name] is a 79 y.o. female who presents to the Emergency Department complaining of vomiting and a headache. Vomiting began after her evening meal at 5 PM of egg whites and toast. She is on a strict schedule due to her reflux. She began vomiting after dinner tonight and is c/o headache to the top of her head.  PCP Dr. Lodema Hong Past Medical History  Diagnosis Date  . Heart palpitations     PAC's   . Hypertension   . Anxiety   . GERD (gastroesophageal reflux disease)   . Helicobacter pylori (H. pylori)     treated in 2009  . Bladder cancer     Past Surgical History  Procedure Laterality Date  . Cholecystectomy  1998  . Bladder surgey  2009  . Cataract extraction  2 years ago    both eyes  . Esophagogastroduodenoscopy  10/19/2011    Procedure: ESOPHAGOGASTRODUODENOSCOPY (EGD);  Surgeon: Malissa Hippo, MD;  Location: [facility];  Service: Endoscopy;  Laterality: N/A;  730    Family History  Problem Relation Age of Onset  . Hypertension Mother   . Diabetes Mother   . Pneumonia Mother   . Diabetes Father   . Stroke Sister   . Stroke Brother   . Hypertension Daughter   . Obesity Daughter   . Diabetes Daughter     History  Substance Use Topics  . Smoking status: Never Smoker   . Smokeless tobacco: Never Used  . Alcohol Use: No    OB History   Grav Para Term Preterm Abortions TAB SAB Ect Mult Living                  Review of Systems  Constitutional: Negative for fever.       10 Systems reviewed and are negative for acute change except as noted in the HPI.  HENT: Negative for congestion.   Eyes:  Negative for discharge and redness.  Respiratory: Negative for cough and shortness of breath.   Cardiovascular: Negative for chest pain.  Gastrointestinal: Positive for vomiting. Negative for abdominal pain.  Musculoskeletal: Negative for back pain.  Skin: Negative for rash.  Neurological: Positive for headaches. Negative for syncope and numbness.  Psychiatric/Behavioral:       No behavior change.    Allergies  Codeine and Sulfonamide derivatives  Home Medications   Current Outpatient Rx  Name  Route  Sig  Dispense  Refill  . Acetaminophen-Caffeine (EXCEDRIN ASPIRIN FREE PO)   Oral   Take 1 tablet by mouth daily as needed. For pain         . amLODipine-benazepril (LOTREL) 5-20 MG per capsule      TAKE 1 CAPSULE BY MOUTH ONCE DAILY.   30 capsule   3   . atenolol (TENORMIN) 25 MG tablet   Oral   Take 1 tablet (25 mg total) by mouth every morning.   30 tablet   2   . benazepril (LOTENSIN) 10 MG tablet   Oral   Take 1 tablet (10 mg total) by mouth daily.   30 tablet   11   .  calcium carbonate (TUMS - DOSED IN MG ELEMENTAL CALCIUM) 500 MG chewable tablet   Oral   Chew 2 tablets by mouth as needed. For heartburn         . docusate sodium (COLACE) 100 MG capsule   Oral   Take 100 mg by mouth 2 (two) times daily.         Marland Kitchen donepezil (ARICEPT) 10 MG tablet   Oral   Take 1 tablet (10 mg total) by mouth at bedtime.   30 tablet   5     Dose increase effective 06/18/2012   . Multiple Vitamin (MULITIVITAMIN WITH MINERALS) TABS   Oral   Take 1 tablet by mouth daily.         . ondansetron (ZOFRAN ODT) 4 MG disintegrating tablet   Oral   Take 1 tablet (4 mg total) by mouth every 8 (eight) hours as needed for nausea.   10 tablet   0   . pantoprazole (PROTONIX) 20 MG tablet   Oral   Take 1 tablet (20 mg total) by mouth 2 (two) times daily.   60 tablet   3   . polyvinyl alcohol (ARTIFICIAL TEARS) 1.4 % ophthalmic solution   Both Eyes   Place 1 drop into  both eyes as needed. For dry eyes           BP 144/70  Pulse 90  Temp(Src) 98.3 F (36.8 C) (Oral)  Resp 18  Ht 5' (1.524 m)  Wt 114 lb (51.71 kg)  BMI 22.26 kg/m2  SpO2 98%  Physical Exam  Nursing note and vitals reviewed. Constitutional: She appears well-developed and well-nourished.  Awake, alert, nontoxic appearance.  HENT:  Head: Normocephalic and atraumatic.  Right Ear: External ear normal.  Left Ear: External ear normal.  Eyes: EOM are normal. Pupils are equal, round, and reactive to light.  Neck: Neck supple.  Cardiovascular: Normal rate and intact distal pulses.   Pulmonary/Chest: Effort normal and breath sounds normal. She exhibits no tenderness.  Abdominal: Soft. Bowel sounds are normal. There is no tenderness. There is no rebound.  Musculoskeletal: She exhibits no tenderness.  Baseline ROM, no obvious new focal weakness.  Neurological:  Mental status and motor strength appears baseline for patient and situation.  Skin: No rash noted.  Psychiatric: She has a normal mood and affect.    ED Course  Procedures (including critical care time) Results for orders placed during the hospital encounter of 08/12/12  CBC      Result Value Range   WBC 7.6  4.0 - 10.5 K/uL   RBC 4.40  3.87 - 5.11 MIL/uL   Hemoglobin 14.1  12.0 - 15.0 g/dL   HCT 82.9  56.2 - 13.0 %   MCV 93.6  78.0 - 100.0 fL   MCH 32.0  26.0 - 34.0 pg   MCHC 34.2  30.0 - 36.0 g/dL   RDW 86.5  78.4 - 69.6 %   Platelets 264  150 - 400 K/uL  BASIC METABOLIC PANEL      Result Value Range   Sodium 141  135 - 145 mEq/L   Potassium 3.4 (*) 3.5 - 5.1 mEq/L   Chloride 102  96 - 112 mEq/L   CO2 26  19 - 32 mEq/L   Glucose, Bld 148 (*) 70 - 99 mg/dL   BUN 22  6 - 23 mg/dL   Creatinine, Ser 2.95  0.50 - 1.10 mg/dL   Calcium 9.7  8.4 - 28.4 mg/dL  GFR calc non Af Amer 60 (*) >90 mL/min   GFR calc Af Amer 70 (*) >90 mL/min     0225 Patient states headache is almost gone. She would like more  medication. 1610 Headache is gone. Nausea has resolved. 0400 Discharge completed. Daughter decided they would wait until later this morning to go home. MDM  Patient with nausea, vomiting and a headache. Labs are normal. Given dilaudid and zofran with good results. Reviewed labs with patient and her daughter.  Pt feels improved after observation and/or treatment in ED.Pt stable in ED with no significant deterioration in condition.The patient appears reasonably screened and/or stabilized for discharge and I doubt any other medical condition or other [facility] requiring further screening, evaluation, or treatment in the ED at this time prior to discharge.  MDM Reviewed: nursing note and vitals Interpretation: labs           Nicoletta Dress. Colon Branch, MD 08/12/12 [id number]  Nicoletta Dress. Colon Branch, MD 08/12/12 0400

## 2012-08-12 NOTE — ED Notes (Signed)
Upon preparing pt for discharge she sat up and vomited clear liquid. States she still has the headache but cannot quantify. Says it been the worst one of her life. ERMD aware and pt medicated with zofran. Daughter wishes to speak with dr strand.

## 2012-08-12 NOTE — ED Notes (Signed)
No further vomiting, pt and daughter now ready to go home. Pt up and dressed and to bathroom. Voided qs. D/c'd via w/c to car w/o incident

## 2012-08-12 NOTE — ED Notes (Signed)
Pt c/o severe headache and vomting.

## 2012-09-16 ENCOUNTER — Ambulatory Visit (INDEPENDENT_AMBULATORY_CARE_PROVIDER_SITE_OTHER): Payer: Medicare PPO | Admitting: Internal Medicine

## 2012-09-17 ENCOUNTER — Ambulatory Visit (INDEPENDENT_AMBULATORY_CARE_PROVIDER_SITE_OTHER): Payer: Medicare PPO | Admitting: Family Medicine

## 2012-09-17 ENCOUNTER — Encounter: Payer: Self-pay | Admitting: Family Medicine

## 2012-09-17 VITALS — BP 134/70 | HR 68 | Resp 16 | Ht 60.0 in | Wt 112.0 lb

## 2012-09-17 DIAGNOSIS — R634 Abnormal weight loss: Secondary | ICD-10-CM | POA: Insufficient documentation

## 2012-09-17 DIAGNOSIS — F0390 Unspecified dementia without behavioral disturbance: Secondary | ICD-10-CM

## 2012-09-17 DIAGNOSIS — R51 Headache: Secondary | ICD-10-CM

## 2012-09-17 DIAGNOSIS — R109 Unspecified abdominal pain: Secondary | ICD-10-CM

## 2012-09-17 DIAGNOSIS — K219 Gastro-esophageal reflux disease without esophagitis: Secondary | ICD-10-CM

## 2012-09-17 DIAGNOSIS — I10 Essential (primary) hypertension: Secondary | ICD-10-CM

## 2012-09-17 DIAGNOSIS — J309 Allergic rhinitis, unspecified: Secondary | ICD-10-CM

## 2012-09-17 MED ORDER — LORATADINE 10 MG PO TABS: 10.0000 mg | ORAL_TABLET | Freq: Every day | ORAL | Status: DC

## 2012-09-17 MED ORDER — RIVASTIGMINE 4.6 MG/24HR TD PT24: 1.0000 | MEDICATED_PATCH | Freq: Every day | TRANSDERMAL | Status: DC

## 2012-09-17 MED ORDER — MEGESTROL ACETATE 40 MG/ML PO SUSP: 200.0000 mg | Freq: Every day | ORAL | Status: DC

## 2012-09-17 MED ORDER — FLUTICASONE PROPIONATE 50 MCG/ACT NA SUSP: 1.0000 | Freq: Every day | NASAL | Status: DC

## 2012-09-17 MED ORDER — PANTOPRAZOLE SODIUM 20 MG PO TBEC: 20.0000 mg | DELAYED_RELEASE_TABLET | Freq: Two times a day (BID) | ORAL | Status: DC

## 2012-09-17 NOTE — Patient Instructions (Addendum)
F/u in 8 weeks   New for allergies is calritin 1 daily, also flonase.  Please take sudafed one daily  For the next 3 days  Limit tylenol to at most one tablet once daily, this will reduce headaches  New for memory is the exelon patch  Resume megace

## 2012-09-17 NOTE — Progress Notes (Signed)
  Subjective:    Patient ID: [name], female    DOB: 26-May-1933, 79 y.o.   MRN: [id number]  HPI Pt in with c/o daily headache for approx 2 weeks. Denies fever ior chills, denies cough , sore throat or ear pain, has had  increased frontal pressure , with clear nasal drainage, and excessive sneezing and watery eyes  Continues to have epigastric pain and nausea, appetite decreased with weight loss, no change in bM, needs to reduce megace to reverse downhill trend  Daughter and ptr willing to try the exelon patch to help the dementia in place of aricept which was d/c because of possible GI s/e     Review of Systems See HPI Denies chest congestion, productive cough or wheezing. Denies chest pains, palpitations and leg swelling   Denies dysuria, frequency, hesitancy or incontinence. Denies joint pain, swelling and limitation in mobility. Denies headaches, seizures, numbness, or tingling. Denies depression, anxiety or insomnia. Denies skin break down or rash.        Objective:   Physical Exam Patient alert and in no cardiopulmonary distress.  HEENT: No facial asymmetry, EOMI, no sinus tenderness,  oropharynx pink and moist.  Neck supple no adenopathy.Nasal mucosa erythematous and edematous, frontal sinus pressure  Chest: Clear to auscultation bilaterally.  CVS: S1, S2 no murmurs, no S3.  ABD: Soft mild epigastric tenderness, no guarding or rebound. Bowel sounds normal.No organomegaly or masses  Ext: No edema  MS: Adequate ROM spine, shoulders, hips and knees.  Skin: Intact, no ulcerations or rash noted.  Psych: Good eye contact, normal affect. Memory loss not anxious or depressed appearing.  CNS: CN 2-12 intact, power, tone and sensation normal throughout.        Assessment & Plan:

## 2012-10-13 NOTE — Assessment & Plan Note (Signed)
Chronic nausea and abdominal pain, may be uncontrolled, will need ongoing  GI follow up

## 2012-10-13 NOTE — Assessment & Plan Note (Signed)
Reduced weight oncer more, pt to resume megace

## 2012-10-13 NOTE — Assessment & Plan Note (Signed)
No focal neurologic deficit, likely aggravated by uncontrolled allergies, start daily claritin

## 2012-10-13 NOTE — Assessment & Plan Note (Signed)
Uncontrolled with headache start daily claritin

## 2012-10-13 NOTE — Assessment & Plan Note (Signed)
Controlled, no change in medication  

## 2012-10-13 NOTE — Assessment & Plan Note (Signed)
Recurrent chronic problem followed by GI, has had upper endoscopy in the past year

## 2012-10-13 NOTE — Assessment & Plan Note (Signed)
hasd discontinued aricept due to possible GI s/ewilltry the exelon patch

## 2012-10-26 ENCOUNTER — Encounter (HOSPITAL_COMMUNITY): Payer: Self-pay | Admitting: Emergency Medicine

## 2012-10-26 ENCOUNTER — Emergency Department (HOSPITAL_COMMUNITY)
Admission: EM | Admit: 2012-10-26 | Discharge: 2012-10-27 | Disposition: A | Discharge status: Home / Self Care | Payer: Medicare PPO | Attending: Emergency Medicine | Admitting: Emergency Medicine

## 2012-10-26 DIAGNOSIS — F411 Generalized anxiety disorder: Secondary | ICD-10-CM | POA: Insufficient documentation

## 2012-10-26 DIAGNOSIS — Z8679 Personal history of other diseases of the circulatory system: Secondary | ICD-10-CM | POA: Insufficient documentation

## 2012-10-26 DIAGNOSIS — Z79899 Other long term (current) drug therapy: Secondary | ICD-10-CM | POA: Insufficient documentation

## 2012-10-26 DIAGNOSIS — R141 Gas pain: Secondary | ICD-10-CM | POA: Insufficient documentation

## 2012-10-26 DIAGNOSIS — Z8551 Personal history of malignant neoplasm of bladder: Secondary | ICD-10-CM | POA: Insufficient documentation

## 2012-10-26 DIAGNOSIS — Z8619 Personal history of other infectious and parasitic diseases: Secondary | ICD-10-CM | POA: Insufficient documentation

## 2012-10-26 DIAGNOSIS — R109 Unspecified abdominal pain: Secondary | ICD-10-CM

## 2012-10-26 DIAGNOSIS — R142 Eructation: Secondary | ICD-10-CM | POA: Insufficient documentation

## 2012-10-26 DIAGNOSIS — IMO0002 Reserved for concepts with insufficient information to code with codable children: Secondary | ICD-10-CM | POA: Insufficient documentation

## 2012-10-26 DIAGNOSIS — R1013 Epigastric pain: Secondary | ICD-10-CM | POA: Insufficient documentation

## 2012-10-26 DIAGNOSIS — I10 Essential (primary) hypertension: Secondary | ICD-10-CM | POA: Insufficient documentation

## 2012-10-26 NOTE — ED Notes (Signed)
Per daughter, patient had vomiting episodes on Friday and Saturday, she felt better.  States vomiting started again tonight and patient c/o GERD.  Daughter states patient has history of GERD.

## 2012-10-27 MED ORDER — SODIUM CHLORIDE 0.9 % IV SOLN
Freq: Once | INTRAVENOUS | Status: AC
  Administered 2012-10-27: 01:00:00 via INTRAVENOUS

## 2012-10-27 MED ORDER — ONDANSETRON HCL 4 MG/2ML IJ SOLN
4.0000 mg | Freq: Once | INTRAMUSCULAR | Status: AC
  Administered 2012-10-27: 4 mg via INTRAVENOUS
  Filled 2012-10-27: qty 2

## 2012-10-27 MED ORDER — MORPHINE SULFATE 4 MG/ML IJ SOLN
2.0000 mg | Freq: Once | INTRAMUSCULAR | Status: AC
  Administered 2012-10-27: 2 mg via INTRAVENOUS
  Filled 2012-10-27: qty 1

## 2012-10-27 MED ORDER — PANTOPRAZOLE SODIUM 40 MG IV SOLR
40.0000 mg | Freq: Once | INTRAVENOUS | Status: AC
  Administered 2012-10-27: 40 mg via INTRAVENOUS
  Filled 2012-10-27: qty 40

## 2012-10-27 NOTE — ED Notes (Signed)
Pt reporting burning in epigastric area due to acid reflux.  Reports she took OTC medication about 30 min ago with no relief.  Pt denies any other concerns at present.

## 2012-10-27 NOTE — ED Provider Notes (Signed)
History     CSN: [id number]  Arrival date & time 10/26/12  2346   First MD Initiated Contact with Patient 10/27/12 0017      Chief Complaint  Patient presents with  . Gastrophageal Reflux    (Consider location/radiation/quality/duration/timing/severity/associated sxs/prior treatment) HPI HPI Comments: [name] is a 79 y.o. female who presents to the Emergency Department complaining of abdominal pain in the epigastric area and spitting up. She has a h/o GERD and takes protonix. She developed episodes on Friday and Saturday nights when she felt bloated, had abdominal pain and was spitting up. She was her normal self through the day today and tonight developed renewed pain, and spitting up. She was given gaviscon without relief. Denies fever, chills, shortness of breath, chest pain.   PCP Dr. Lodema Hong   Past Medical History  Diagnosis Date  . Heart palpitations     PAC's   . Hypertension   . Anxiety   . GERD (gastroesophageal reflux disease)   . Helicobacter pylori (H. pylori)     treated in 2009  . Bladder cancer     Past Surgical History  Procedure Laterality Date  . Cholecystectomy  1998  . Bladder surgey  2009  . Cataract extraction  2 years ago    both eyes  . Esophagogastroduodenoscopy  10/19/2011    Procedure: ESOPHAGOGASTRODUODENOSCOPY (EGD);  Surgeon: Malissa Hippo, MD;  Location: [facility];  Service: Endoscopy;  Laterality: N/A;  730    Family History  Problem Relation Age of Onset  . Hypertension Mother   . Diabetes Mother   . Pneumonia Mother   . Diabetes Father   . Stroke Sister   . Stroke Brother   . Hypertension Daughter   . Obesity Daughter   . Diabetes Daughter     History  Substance Use Topics  . Smoking status: Never Smoker   . Smokeless tobacco: Never Used  . Alcohol Use: No    OB History   Grav Para Term Preterm Abortions TAB SAB Ect Mult Living                  Review of Systems  Constitutional: Negative for fever.       10 Systems reviewed and are negative for acute change except as noted in the HPI.  HENT: Negative for congestion.   Eyes: Negative for discharge and redness.  Respiratory: Negative for cough and shortness of breath.   Cardiovascular: Negative for chest pain.  Gastrointestinal: Positive for abdominal pain. Negative for vomiting.  Musculoskeletal: Negative for back pain.  Skin: Negative for rash.  Neurological: Negative for syncope, numbness and headaches.  Psychiatric/Behavioral:       No behavior change.    Allergies  Codeine and Sulfonamide derivatives  Home Medications   Current Outpatient Rx  Name  Route  Sig  Dispense  Refill  . amLODipine-benazepril (LOTREL) 5-20 MG per capsule      TAKE 1 CAPSULE BY MOUTH ONCE DAILY.   30 capsule   3   . benazepril (LOTENSIN) 10 MG tablet   Oral   Take 1 tablet (10 mg total) by mouth daily.   30 tablet   11   . calcium carbonate (TUMS - DOSED IN MG ELEMENTAL CALCIUM) 500 MG chewable tablet   Oral   Chew 2 tablets by mouth as needed. For heartburn         . docusate sodium (COLACE) 100 MG capsule   Oral   Take  100 mg by mouth 2 (two) times daily.         . fluticasone (FLONASE) 50 MCG/ACT nasal spray   Nasal   Place 1 spray into the nose daily.   16 g   2   . loratadine (CLARITIN) 10 MG tablet   Oral   Take 1 tablet (10 mg total) by mouth daily.   30 tablet   2   . megestrol (MEGACE) 40 MG/ML suspension   Oral   Take 5 mLs (200 mg total) by mouth daily.   240 mL   1   . Multiple Vitamin (MULITIVITAMIN WITH MINERALS) TABS   Oral   Take 1 tablet by mouth daily.         . pantoprazole (PROTONIX) 20 MG tablet   Oral   Take 1 tablet (20 mg total) by mouth 2 (two) times daily.   60 tablet   3   . polyvinyl alcohol (ARTIFICIAL TEARS) 1.4 % ophthalmic solution   Both Eyes   Place 1 drop into both eyes as needed. For dry eyes         . rivastigmine (EXELON) 4.6 mg/24hr   Transdermal   Place 1 patch  (4.6 mg total) onto the skin daily.   30 patch   12     Intolerant of aricept   . Acetaminophen-Caffeine (EXCEDRIN ASPIRIN FREE PO)   Oral   Take 1 tablet by mouth daily as needed. For pain         . atenolol (TENORMIN) 25 MG tablet   Oral   Take 1 tablet (25 mg total) by mouth every morning.   30 tablet   2   . ondansetron (ZOFRAN ODT) 4 MG disintegrating tablet   Oral   Take 1 tablet (4 mg total) by mouth every 8 (eight) hours as needed for nausea.   10 tablet   0     BP 134/48  Pulse 78  Temp(Src) 98.7 F (37.1 C) (Oral)  Resp 20  Ht 5\' 1"  (1.549 m)  Wt 115 lb (52.164 kg)  BMI 21.74 kg/m2  SpO2 99%  Physical Exam  Nursing note and vitals reviewed. Constitutional: She appears well-developed and well-nourished.  Awake, alert, nontoxic appearance.  HENT:  Head: Normocephalic and atraumatic.  Eyes: EOM are normal. Pupils are equal, round, and reactive to light. Right eye exhibits no discharge. Left eye exhibits no discharge.  Neck: Normal range of motion. Neck supple.  Cardiovascular: Normal rate and intact distal pulses.   Pulmonary/Chest: Effort normal and breath sounds normal. She exhibits no tenderness.  Abdominal: Soft. Bowel sounds are normal. There is tenderness. There is no rebound.  Tenderness to epigastric area  Musculoskeletal: She exhibits no tenderness.  Baseline ROM, no obvious new focal weakness.  Neurological:  Mental status and motor strength appears baseline for patient and situation.  Skin: No rash noted.  Psychiatric: She has a normal mood and affect.    ED Course  Procedures (including critical care time) Medications  0.9 %  sodium chloride infusion ( Intravenous New Bag/Given 10/27/12 0104)  pantoprazole (PROTONIX) injection 40 mg (40 mg Intravenous Given 10/27/12 0107)  ondansetron (ZOFRAN) injection 4 mg (4 mg Intravenous Given 10/27/12 0107)  morphine 4 MG/ML injection 2 mg (2 mg Intravenous Given 10/27/12 0107)     0209 Patient is  feeling better. Pain is gone. No further spitting up.  MDM  Patient with known GERD here with epigastric pain and spitting up. Given protonix, zofran  and morphine with relief. Pt stable in ED with no significant deterioration in condition.The patient appears reasonably screened and/or stabilized for discharge and I doubt any other medical condition or other [facility] requiring further screening, evaluation, or treatment in the ED at this time prior to discharge.  MDM Reviewed: nursing note and vitals           Nicoletta Dress. Colon Branch, MD 10/27/12 [phone]

## 2012-11-13 ENCOUNTER — Ambulatory Visit (INDEPENDENT_AMBULATORY_CARE_PROVIDER_SITE_OTHER): Payer: Medicare PPO | Admitting: Internal Medicine

## 2012-11-13 ENCOUNTER — Encounter (INDEPENDENT_AMBULATORY_CARE_PROVIDER_SITE_OTHER): Payer: Self-pay | Admitting: Internal Medicine

## 2012-11-13 VITALS — BP 108/68 | HR 70 | Temp 99.0°F | Resp 16 | Ht 62.0 in | Wt 121.0 lb

## 2012-11-13 DIAGNOSIS — R1013 Epigastric pain: Secondary | ICD-10-CM

## 2012-11-13 DIAGNOSIS — R112 Nausea with vomiting, unspecified: Secondary | ICD-10-CM

## 2012-11-13 MED ORDER — PANTOPRAZOLE SODIUM 20 MG PO TBEC: 40.0000 mg | DELAYED_RELEASE_TABLET | Freq: Two times a day (BID) | ORAL | Status: DC

## 2012-11-13 NOTE — Patient Instructions (Signed)
Symptom diary as discussed until next office visit.

## 2012-11-13 NOTE — Progress Notes (Signed)
Presenting complaint;  Epigastric pain, nausea and vomiting.   Subjective:  Patient is 79 year old female who presents for scheduled visit accompanied by her daughter Marylene Land. This visit was scheduled following her trip to the emergency room on 10/27/2012 for epigastric pain. Patient was last seen 4 months ago and she was doing well. Megace was discontinued when her weight up to 126 pounds which is over a month ago. Since then she has lost 5 pounds. She is having epigastric pain again. He has been going on on daily basis for the last several weeks. Pain always occurs after evening meal she eats between 4 and 5 PM. This pain lasts for 30 minutes. It is associated with nausea vomiting and at times she spits up food. In emergency room she was treated with PPI Zofran and morphine and her pain resolved. She did not have any workup. She denies hematemesis fever chills melena or rectal bleeding. Her bowels move better since she has been on stool softener. Her daughter states that she has been sleeping too much lately. She was switched from Aricept to  Exelon. Patient states her appetite is normal.  Current Medications: Current Outpatient Prescriptions  Medication Sig Dispense Refill  . Acetaminophen-Caffeine (EXCEDRIN ASPIRIN FREE PO) Take 1 tablet by mouth daily as needed. For pain      . amLODipine-benazepril (LOTREL) 5-20 MG per capsule TAKE 1 CAPSULE BY MOUTH ONCE DAILY.  30 capsule  3  . benazepril (LOTENSIN) 10 MG tablet Take 1 tablet (10 mg total) by mouth daily.  30 tablet  11  . calcium carbonate (TUMS - DOSED IN MG ELEMENTAL CALCIUM) 500 MG chewable tablet Chew 2 tablets by mouth as needed. For heartburn      . docusate sodium (COLACE) 100 MG capsule Take 100 mg by mouth daily.       . fluticasone (FLONASE) 50 MCG/ACT nasal spray Place 1 spray into the nose daily.  16 g  2  . loratadine (CLARITIN) 10 MG tablet Take 1 tablet (10 mg total) by mouth daily.  30 tablet  2  . Multiple Vitamin  (MULITIVITAMIN WITH MINERALS) TABS Take 1 tablet by mouth daily.      . pantoprazole (PROTONIX) 20 MG tablet Take 1 tablet (20 mg total) by mouth 2 (two) times daily.  60 tablet  3  . phenylephrine (SUDAFED PE) 10 MG TABS Take 10 mg by mouth as needed.      . polyvinyl alcohol (ARTIFICIAL TEARS) 1.4 % ophthalmic solution Place 1 drop into both eyes as needed. For dry eyes      . rivastigmine (EXELON) 4.6 mg/24hr Place 1 patch (4.6 mg total) onto the skin daily.  30 patch  12  . megestrol (MEGACE) 40 MG/ML suspension Take 5 mLs (200 mg total) by mouth daily.  240 mL  1   No current facility-administered medications for this visit.     Objective: Blood pressure 108/68, pulse 70, temperature 99 F (37.2 C), temperature source Oral, resp. rate 16, height 5\' 2"  (1.575 m), weight 121 lb (54.885 kg). Patient is alert and in no acute distress. Conjunctiva is pink. Sclera is nonicteric Oropharyngeal mucosa is normal. No neck masses or thyromegaly noted. Cardiac exam with regular rhythm normal S1 and S2. No murmur or gallop noted. Lungs are clear to auscultation. Abdomen is symmetrical. Bowel sounds are normal. No bruits noted. Abdomen is soft with mild midepigastric tenderness without guarding. No organomegaly or masses.  No LE edema or clubbing noted.   Assessment:  #  1. Acute on chronic epigastric pain. She has undergone fairly extensive workup in the past 2 years with ultrasound, CT as well as EGD and LFTs. She has had cholecystectomy previously. Her symptom complex suspicious for chronic dyspepsia. Very curious to know that pain only occurs after evening meals. #2. Weight loss. She has lost 5 pounds since Megace was discontinued. She is still a lot better from where she was in June 2013 when she weighed 103 pounds.   Plan:  Hemoccult x1. Increase pantoprazole to 40 mg by mouth twice a day. If patient has another episode of significant pain with check LFTs and serum amylase and  lipase. Patient advised to eat less food at evening meal and more at breakfast and lunch. Symptom diary on that office visit in one month.

## 2012-12-12 ENCOUNTER — Other Ambulatory Visit: Payer: Self-pay | Admitting: Family Medicine

## 2012-12-15 ENCOUNTER — Encounter (INDEPENDENT_AMBULATORY_CARE_PROVIDER_SITE_OTHER): Payer: Self-pay | Admitting: Internal Medicine

## 2012-12-15 ENCOUNTER — Ambulatory Visit (INDEPENDENT_AMBULATORY_CARE_PROVIDER_SITE_OTHER): Payer: Medicare PPO | Admitting: Internal Medicine

## 2012-12-15 VITALS — BP 130/70 | HR 72 | Temp 98.4°F | Resp 16 | Ht 62.0 in | Wt 122.4 lb

## 2012-12-15 DIAGNOSIS — R1013 Epigastric pain: Secondary | ICD-10-CM

## 2012-12-15 DIAGNOSIS — M1990 Unspecified osteoarthritis, unspecified site: Secondary | ICD-10-CM

## 2012-12-15 DIAGNOSIS — Z79899 Other long term (current) drug therapy: Secondary | ICD-10-CM

## 2012-12-15 DIAGNOSIS — Z780 Asymptomatic menopausal state: Secondary | ICD-10-CM

## 2012-12-15 DIAGNOSIS — K219 Gastro-esophageal reflux disease without esophagitis: Secondary | ICD-10-CM

## 2012-12-15 NOTE — Progress Notes (Signed)
Presenting complaint;  Followup for epigastric pain nausea and vomiting.  Subjective:  Patient is 79 year old African female who is here for scheduled visit accompanied by her daughter Marylene Land. She has chronic epigastric pain. She was seen one month ago with a worsening epigastric pain and nausea vomiting and cough on weight loss. She has undergone multiple studies in the past. Pantoprazole dose was increased and she was advised to keep stool diary. She now returns stating that she feels much better. She only had 2 episodes of vomiting one after eating watermelon and one episode occurred yesterday when she vomited small amount of liquid. Her weight is up by 1 pound. She denies abdominal pain melena or rectal bleeding. As recommended she has kept her food diary. Review of this time reveals that she where she eats the same foods at each meal every day with very few exceptions. She does not eat snacks.  Current Medications: Current Outpatient Prescriptions  Medication Sig Dispense Refill  . Acetaminophen-Caffeine (EXCEDRIN ASPIRIN FREE PO) Take 1 tablet by mouth daily as needed. For pain      . Alum Hydroxide-Mag Carbonate (GAVISCON PO) Take by mouth. Per the patient's daughter she takes 1 by mouth as needed.      Marland Kitchen amLODipine-benazepril (LOTREL) 5-20 MG per capsule TAKE 1 CAPSULE BY MOUTH ONCE DAILY.  30 capsule  3  . benazepril (LOTENSIN) 10 MG tablet Take 1 tablet (10 mg total) by mouth daily.  30 tablet  11  . calcium carbonate (TUMS - DOSED IN MG ELEMENTAL CALCIUM) 500 MG chewable tablet Chew 2 tablets by mouth as needed. For heartburn      . docusate sodium (COLACE) 100 MG capsule Take 100 mg by mouth daily.       . fluticasone (FLONASE) 50 MCG/ACT nasal spray Place 1 spray into the nose daily.  16 g  2  . loratadine (CLARITIN) 10 MG tablet TAKE (1) TABLET BY MOUTH ONCE DAILY.  30 tablet  0  . Multiple Vitamin (MULITIVITAMIN WITH MINERALS) TABS Take 1 tablet by mouth daily.      . pantoprazole  (PROTONIX) 20 MG tablet Take 2 tablets (40 mg total) by mouth 2 (two) times daily.  60 tablet  3  . phenylephrine (SUDAFED PE) 10 MG TABS Take 10 mg by mouth as needed.      . polyvinyl alcohol (ARTIFICIAL TEARS) 1.4 % ophthalmic solution Place 1 drop into both eyes as needed. For dry eyes      . rivastigmine (EXELON) 4.6 mg/24hr Place 1 patch (4.6 mg total) onto the skin daily.  30 patch  12   No current facility-administered medications for this visit.     Objective: Blood pressure 130/70, pulse 72, temperature 98.4 F (36.9 C), temperature source Oral, resp. rate 16, height 5\' 2"  (1.575 m), weight 122 lb 6.4 oz (55.52 kg). Patient is alert and in no acute distress. Conjunctiva is pink. Sclera is nonicteric Oropharyngeal mucosa is normal. No neck masses or thyromegaly noted. Cardiac exam with regular rhythm normal S1 and S2. No murmur or gallop noted. Lungs are clear to auscultation. Abdomen is soft and nontender without organomegaly or masses.  No LE edema or clubbing noted.   Assessment:  #1. Chronic epigastric pain associated with sporadic nausea and vomiting. She is doing better with double dose PPI. She had abdominopelvic CT in March 2013 and EGD in June 2013 revealing gastritis but no evidence of H. pylori infection. There is nothing to suggest he has chronic pancreatitis.  She may just have chronic dyspepsia without clear-cut etiology. Patient has been on chronic PPI therapy and at risk for osteoporosis. Unclear as to when her last study was. #2. .History of weight loss. She is still 15 pounds hard that she was at her nadir. She definitely has room to increase oral intake   Plan:  Continue pantoprazole at twice a day schedule. Try to eat foods with each meal and snacks between meals. Bone density study. Office visit in 6 months.

## 2012-12-15 NOTE — Patient Instructions (Addendum)
Physician will contact you with results of bone density study when completed. Please add fruits and snacks to your diet.

## 2012-12-16 NOTE — Addendum Note (Signed)
Addended by: Malissa Hippo on: 12/16/2012 08:47 AM   Modules accepted: Orders

## 2012-12-25 ENCOUNTER — Other Ambulatory Visit (HOSPITAL_COMMUNITY): Payer: Medicare PPO

## 2013-01-02 ENCOUNTER — Other Ambulatory Visit (HOSPITAL_COMMUNITY): Payer: Medicare PPO

## 2013-01-08 ENCOUNTER — Other Ambulatory Visit: Payer: Self-pay | Admitting: Family Medicine

## 2013-01-13 ENCOUNTER — Ambulatory Visit (INDEPENDENT_AMBULATORY_CARE_PROVIDER_SITE_OTHER): Payer: Medicare PPO | Admitting: Internal Medicine

## 2013-01-15 ENCOUNTER — Ambulatory Visit (HOSPITAL_COMMUNITY)
Admission: RE | Admit: 2013-01-15 | Discharge: 2013-01-15 | Disposition: A | Discharge status: Home / Self Care | Payer: Medicare PPO | Source: Ambulatory Visit | Attending: Internal Medicine | Admitting: Internal Medicine

## 2013-01-15 DIAGNOSIS — Z79899 Other long term (current) drug therapy: Secondary | ICD-10-CM

## 2013-01-15 DIAGNOSIS — Z780 Asymptomatic menopausal state: Secondary | ICD-10-CM

## 2013-01-15 DIAGNOSIS — M818 Other osteoporosis without current pathological fracture: Secondary | ICD-10-CM | POA: Insufficient documentation

## 2013-01-15 DIAGNOSIS — M1990 Unspecified osteoarthritis, unspecified site: Secondary | ICD-10-CM

## 2013-01-16 ENCOUNTER — Other Ambulatory Visit: Payer: Self-pay | Admitting: Family Medicine

## 2013-01-21 ENCOUNTER — Encounter (INDEPENDENT_AMBULATORY_CARE_PROVIDER_SITE_OTHER): Payer: Self-pay

## 2013-01-22 ENCOUNTER — Ambulatory Visit (INDEPENDENT_AMBULATORY_CARE_PROVIDER_SITE_OTHER): Payer: Medicare PPO | Admitting: Family Medicine

## 2013-01-22 ENCOUNTER — Encounter: Payer: Self-pay | Admitting: Family Medicine

## 2013-01-22 VITALS — BP 132/72 | HR 68 | Resp 18 | Ht 60.0 in | Wt 124.0 lb

## 2013-01-22 DIAGNOSIS — D649 Anemia, unspecified: Secondary | ICD-10-CM

## 2013-01-22 DIAGNOSIS — F0280 Dementia in other diseases classified elsewhere without behavioral disturbance: Secondary | ICD-10-CM

## 2013-01-22 DIAGNOSIS — R002 Palpitations: Secondary | ICD-10-CM

## 2013-01-22 DIAGNOSIS — F0390 Unspecified dementia without behavioral disturbance: Secondary | ICD-10-CM

## 2013-01-22 DIAGNOSIS — E785 Hyperlipidemia, unspecified: Secondary | ICD-10-CM

## 2013-01-22 DIAGNOSIS — I10 Essential (primary) hypertension: Secondary | ICD-10-CM

## 2013-01-22 DIAGNOSIS — C679 Malignant neoplasm of bladder, unspecified: Secondary | ICD-10-CM

## 2013-01-22 DIAGNOSIS — Z23 Encounter for immunization: Secondary | ICD-10-CM

## 2013-01-22 DIAGNOSIS — Q782 Osteopetrosis: Secondary | ICD-10-CM

## 2013-01-22 MED ORDER — RIVASTIGMINE 9.5 MG/24HR TD PT24: 1.0000 | MEDICATED_PATCH | Freq: Every day | TRANSDERMAL | Status: DC

## 2013-01-22 NOTE — Progress Notes (Signed)
  Subjective:    Patient ID: [name], female    DOB: 1933/05/19, 79 y.o.   MRN: [id number]  HPI The PT is here for follow up and re-evaluation of chronic medical conditions, medication management and review of any available recent lab and radiology data. Recent dexa by GI shows osteoperosis and she is here to discuss this Preventive health is updated, specifically  Cancer screening and Immunization.   Questions or concerns regarding consultations or procedures which the PT has had in the interim are  addressed. The PT denies any adverse reactions to current medications since the last visit.    There are no specific complaints  No longer on megace , and weight is appropriate for height       Review of Systems See HPI Denies recent fever or chills. Denies sinus pressure, nasal congestion, ear pain or sore throat. Denies chest congestion, productive cough or wheezing. Denies chest pains, palpitations and leg swelling Denies abdominal pain, nausea, vomiting,diarrhea or constipation.   Denies dysuria, frequency, hesitancy or incontinence. Denies joint pain, swelling and limitation in mobility. Denies headaches, seizures, numbness, or tingling. Denies depression, anxiety or insomnia. Denies skin break down or rash.        Objective:   Physical Exam Patient alert  and in no cardiopulmonary distress.  HEENT: No facial asymmetry, EOMI, no sinus tenderness,  oropharynx pink and moist.  Neck supple no adenopathy.  Chest: Clear to auscultation bilaterally.  CVS: S1, S2 no murmurs, no S3.  ABD: Soft non tender. Bowel sounds normal.  Ext: No edema  MS: Adequate ROM spine, shoulders, hips and knees.  Skin: Intact, no ulcerations or rash noted.  Psych: Good eye contact, normal affect. Memory loss not anxious or depressed appearing.  CNS: CN 2-12 intact, power, tone and sensation normal throughout.        Assessment & Plan:

## 2013-01-22 NOTE — Patient Instructions (Addendum)
F/u early January, please call if you need me before  Flu vaccine today.  You look extremely well, and I am glad that your medication is helping you.  NEW start exercise every day to help to strengthen your bones, check out your [facility]  NEW for thinning bones to reduce risk of fracture calcium, with D (liquid filled, gel capsule)1200mg  calcium /vit D 1000IU ONE daily  Increase in the dose of your medication for memory, to standard treating dose.  Check with the senior citizens group in [place] , this is very active, I think that you would enjoy this  STOP flonase and claritin, you do not need these now, no allergies    Fasting lipid, chem 7, TSH, vit D as soon as possible please

## 2013-01-23 ENCOUNTER — Other Ambulatory Visit: Payer: Self-pay | Admitting: Family Medicine

## 2013-01-23 LAB — LIPID PANEL
LDL Cholesterol: 172 mg/dL — ABNORMAL HIGH (ref 0–99)
VLDL: 12 mg/dL (ref 0–40)

## 2013-01-23 LAB — BASIC METABOLIC PANEL
Chloride: 107 mEq/L (ref 96–112)
Potassium: 5.4 mEq/L — ABNORMAL HIGH (ref 3.5–5.3)
Sodium: 144 mEq/L (ref 135–145)

## 2013-01-24 DIAGNOSIS — Q782 Osteopetrosis: Secondary | ICD-10-CM | POA: Insufficient documentation

## 2013-01-24 NOTE — Assessment & Plan Note (Signed)
Increased and uncontrolled , recommend statin resumption, will send e chart message to daughter with f/u call by nurse

## 2013-01-24 NOTE — Assessment & Plan Note (Signed)
Controlled and asymptomatic

## 2013-01-24 NOTE — Assessment & Plan Note (Signed)
Controlled, no change in medication DASH diet and commitment to daily physical activity for a minimum of 30 minutes discussed and encouraged, as a part of hypertension management. The importance of attaining a healthy weight is also discussed.  

## 2013-01-24 NOTE — Assessment & Plan Note (Signed)
Pt expresses interest in seniors grp as well as denies adverse response to med , titrate yup to treating dose

## 2013-01-24 NOTE — Assessment & Plan Note (Signed)
Pt to start calcium and D gel capsule and weight bearing exercise and muscle strengthening. Dut to GI complaints oral therapy is not a good option, mentioned IV agent , no interest at this point. Will rept dexa in 2 years. Fall prevention discussed

## 2013-01-24 NOTE — Assessment & Plan Note (Signed)
urology f/u this year shows no recurrence per daughter

## 2013-01-27 LAB — HEPATIC FUNCTION PANEL
ALT: 11 U/L (ref 0–35)
Albumin: 4.1 g/dL (ref 3.5–5.2)
Total Protein: 7 g/dL (ref 6.0–8.3)

## 2013-01-27 NOTE — Addendum Note (Signed)
Addended by: Kandis Fantasia B on: 01/27/2013 11:21 AM   Modules accepted: Orders

## 2013-01-30 ENCOUNTER — Other Ambulatory Visit: Payer: Self-pay

## 2013-01-30 DIAGNOSIS — E785 Hyperlipidemia, unspecified: Secondary | ICD-10-CM

## 2013-01-30 MED ORDER — PRAVASTATIN SODIUM 40 MG PO TABS: 40.0000 mg | ORAL_TABLET | Freq: Every evening | ORAL | Status: DC

## 2013-02-02 ENCOUNTER — Encounter: Payer: Self-pay | Admitting: Family Medicine

## 2013-02-11 ENCOUNTER — Other Ambulatory Visit: Payer: Self-pay | Admitting: Family Medicine

## 2013-03-01 ENCOUNTER — Encounter: Payer: Self-pay | Admitting: Family Medicine

## 2013-03-01 NOTE — Telephone Encounter (Signed)
Please advise 

## 2013-03-12 ENCOUNTER — Encounter: Payer: Self-pay | Admitting: Family Medicine

## 2013-03-12 ENCOUNTER — Ambulatory Visit (INDEPENDENT_AMBULATORY_CARE_PROVIDER_SITE_OTHER): Payer: Medicare PPO | Admitting: Family Medicine

## 2013-03-12 VITALS — BP 150/70 | HR 72 | Resp 18 | Ht 60.0 in | Wt 127.0 lb

## 2013-03-12 DIAGNOSIS — I10 Essential (primary) hypertension: Secondary | ICD-10-CM

## 2013-03-12 DIAGNOSIS — F0280 Dementia in other diseases classified elsewhere without behavioral disturbance: Secondary | ICD-10-CM

## 2013-03-12 DIAGNOSIS — L239 Allergic contact dermatitis, unspecified cause: Secondary | ICD-10-CM

## 2013-03-12 DIAGNOSIS — M159 Polyosteoarthritis, unspecified: Secondary | ICD-10-CM

## 2013-03-12 DIAGNOSIS — R2681 Unsteadiness on feet: Secondary | ICD-10-CM | POA: Insufficient documentation

## 2013-03-12 DIAGNOSIS — L259 Unspecified contact dermatitis, unspecified cause: Secondary | ICD-10-CM

## 2013-03-12 MED ORDER — DONEPEZIL HCL 5 MG PO TABS: 5.0000 mg | ORAL_TABLET | Freq: Every day | ORAL | Status: DC

## 2013-03-12 NOTE — Patient Instructions (Addendum)
F/u as before  Stop the exelon patch, new for the memory is aricept 5mg  one daily  Itching will improve, but OK to use benadryl at night as needed, for excessive itching  Script for cane will be provided, please do not fall

## 2013-03-13 DIAGNOSIS — L239 Allergic contact dermatitis, unspecified cause: Secondary | ICD-10-CM | POA: Insufficient documentation

## 2013-03-13 NOTE — Progress Notes (Signed)
  Subjective:    Patient ID: [name], female    DOB: 1934-01-05, 79 y.o.   MRN: [id number]  HPI Pt in with c/o uncontrolled itching and rash espescialy on her back , where she is applying the exelon patch, progressively worsening in the past 3 to 4 weeks Also c/o itch under breasts, thinks likely due to tight bra Denies difficulty breathing, urticaria,  Difficulty swallowing    Review of Systems See HPI Denies recent fever or chills. Denies sinus pressure, nasal congestion, ear pain or sore throat. Denies chest congestion, productive cough or wheezing. Denies chest pains, palpitations and leg swelling . Denies depression, anxiety or insomnia.       Objective:   Physical Exam  Patient alert  and in no cardiopulmonary distress.  HEENT: No facial asymmetry, EOMI, no sinus tenderness,  oropharynx pink and moist.  Neck supple no adenopathy.  Chest: Clear to auscultation bilaterally.  CVS: S1, S2 no murmurs, no S3.  ABD: Soft non tender. Bowel sounds normal.  Ext: No edema  MS: Adequate ROM spine, shoulders, hips and knees.  Skin: Intact, hyperpigmented lesions, circular, over entire upper back where patch is applied. No skin breakdown No skin lesions in bra area, just marks where strap is pressing on skin, likely too tight  Psych: Good eye contact, normal affect. Memory loss not anxious or depressed appearing.  CNS: CN 2-12 intact, power, tone and sensation normal throughout.       Assessment & Plan:

## 2013-03-13 NOTE — Assessment & Plan Note (Signed)
rept MMS next vist. Change back to oral med due to skin complaint

## 2013-03-13 NOTE — Assessment & Plan Note (Signed)
Topical irritation where aricept  patch applied, d/c same, trial of aricept

## 2013-03-14 ENCOUNTER — Encounter (HOSPITAL_COMMUNITY): Payer: Self-pay | Admitting: Emergency Medicine

## 2013-03-14 ENCOUNTER — Emergency Department (HOSPITAL_COMMUNITY)
Admission: EM | Admit: 2013-03-14 | Discharge: 2013-03-14 | Disposition: A | Discharge status: Home / Self Care | Payer: Medicare PPO | Attending: Emergency Medicine | Admitting: Emergency Medicine

## 2013-03-14 DIAGNOSIS — Z8659 Personal history of other mental and behavioral disorders: Secondary | ICD-10-CM | POA: Insufficient documentation

## 2013-03-14 DIAGNOSIS — Z8619 Personal history of other infectious and parasitic diseases: Secondary | ICD-10-CM | POA: Insufficient documentation

## 2013-03-14 DIAGNOSIS — R51 Headache: Secondary | ICD-10-CM | POA: Insufficient documentation

## 2013-03-14 DIAGNOSIS — Z8679 Personal history of other diseases of the circulatory system: Secondary | ICD-10-CM | POA: Insufficient documentation

## 2013-03-14 DIAGNOSIS — I10 Essential (primary) hypertension: Secondary | ICD-10-CM | POA: Insufficient documentation

## 2013-03-14 DIAGNOSIS — Z79899 Other long term (current) drug therapy: Secondary | ICD-10-CM | POA: Insufficient documentation

## 2013-03-14 DIAGNOSIS — F0390 Unspecified dementia without behavioral disturbance: Secondary | ICD-10-CM | POA: Insufficient documentation

## 2013-03-14 DIAGNOSIS — R1110 Vomiting, unspecified: Secondary | ICD-10-CM | POA: Insufficient documentation

## 2013-03-14 DIAGNOSIS — K219 Gastro-esophageal reflux disease without esophagitis: Secondary | ICD-10-CM | POA: Insufficient documentation

## 2013-03-14 DIAGNOSIS — Z8551 Personal history of malignant neoplasm of bladder: Secondary | ICD-10-CM | POA: Insufficient documentation

## 2013-03-14 LAB — CBC WITH DIFFERENTIAL/PLATELET
Basophils Absolute: 0 10*3/uL (ref 0.0–0.1)
Eosinophils Absolute: 0.2 10*3/uL (ref 0.0–0.7)
Eosinophils Relative: 3 % (ref 0–5)
HCT: 41.4 % (ref 36.0–46.0)
Lymphocytes Relative: 25 % (ref 12–46)
MCH: 32.2 pg (ref 26.0–34.0)
MCV: 94.5 fL (ref 78.0–100.0)
Monocytes Absolute: 0.5 10*3/uL (ref 0.1–1.0)
Monocytes Relative: 8 % (ref 3–12)
Neutro Abs: 4.4 10*3/uL (ref 1.7–7.7)
RDW: 13.6 % (ref 11.5–15.5)
WBC: 6.9 10*3/uL (ref 4.0–10.5)

## 2013-03-14 LAB — BASIC METABOLIC PANEL
BUN: 17 mg/dL (ref 6–23)
CO2: 27 mEq/L (ref 19–32)
Calcium: 9.7 mg/dL (ref 8.4–10.5)
Creatinine, Ser: 0.98 mg/dL (ref 0.50–1.10)
Glucose, Bld: 109 mg/dL — ABNORMAL HIGH (ref 70–99)
Sodium: 139 mEq/L (ref 135–145)

## 2013-03-14 LAB — SEDIMENTATION RATE: Sed Rate: 20 mm/hr (ref 0–22)

## 2013-03-14 MED ORDER — METOCLOPRAMIDE HCL 10 MG PO TABS: 10.0000 mg | ORAL_TABLET | Freq: Four times a day (QID) | ORAL | Status: DC | PRN

## 2013-03-14 MED ORDER — METOCLOPRAMIDE HCL 5 MG/ML IJ SOLN
10.0000 mg | Freq: Once | INTRAMUSCULAR | Status: AC
  Administered 2013-03-14: 10 mg via INTRAVENOUS
  Filled 2013-03-14: qty 2

## 2013-03-14 MED ORDER — SODIUM CHLORIDE 0.9 % IV SOLN: 1000.0000 mL | INTRAVENOUS | Status: DC

## 2013-03-14 MED ORDER — SODIUM CHLORIDE 0.9 % IV SOLN
1000.0000 mL | Freq: Once | INTRAVENOUS | Status: AC
  Administered 2013-03-14: 1000 mL via INTRAVENOUS

## 2013-03-14 MED ORDER — DIPHENHYDRAMINE HCL 50 MG/ML IJ SOLN
25.0000 mg | Freq: Once | INTRAMUSCULAR | Status: AC
  Administered 2013-03-14: 25 mg via INTRAVENOUS
  Filled 2013-03-14: qty 1

## 2013-03-14 NOTE — ED Provider Notes (Signed)
CSN: [id number]     Arrival date & time 03/14/13  0127 History   First MD Initiated Contact with Patient 03/14/13 0128     Chief Complaint  Patient presents with  . Headache   (Consider location/radiation/quality/duration/timing/severity/associated sxs/prior Treatment) The history is provided by the patient and a relative. The history is limited by the condition of the patient (Dementia).   79 year old female started complaining of headache about 24 hours ago. Headache is frontal but she is unable to verbalize the character or severity. Her daughter states that she vomited a small amount of material this evening. She has had problems with headaches in the past. There's been no fever or chills. She was given a dose of acetaminophen which didn't give partial relief of her headache but it returned to baseline. Daughter also states that family member had similar headaches related to low potassium.  Past Medical History  Diagnosis Date  . Heart palpitations     PAC's   . Hypertension   . Anxiety   . GERD (gastroesophageal reflux disease)   . Helicobacter pylori (H. pylori)     treated in 2009  . Bladder cancer    Past Surgical History  Procedure Laterality Date  . Cholecystectomy  1998  . Bladder surgey  2009  . Cataract extraction  2 years ago    both eyes  . Esophagogastroduodenoscopy  10/19/2011    Procedure: ESOPHAGOGASTRODUODENOSCOPY (EGD);  Surgeon: Malissa Hippo, MD;  Location: [facility];  Service: Endoscopy;  Laterality: N/A;  730   Family History  Problem Relation Age of Onset  . Hypertension Mother   . Diabetes Mother   . Pneumonia Mother   . Diabetes Father   . Stroke Sister   . Stroke Brother   . Hypertension Daughter   . Obesity Daughter   . Diabetes Daughter    History  Substance Use Topics  . Smoking status: Never Smoker   . Smokeless tobacco: Never Used  . Alcohol Use: No   OB History   Grav Para Term Preterm Abortions TAB SAB Ect Mult Living           Review of Systems  Unable to perform ROS: Dementia    Allergies  Codeine; Sulfonamide derivatives; and Exelon  Home Medications   Current Outpatient Rx  Name  Route  Sig  Dispense  Refill  . Acetaminophen-Caffeine (EXCEDRIN ASPIRIN FREE PO)   Oral   Take 1 tablet by mouth daily as needed. For pain         . Alum Hydroxide-Mag Carbonate (GAVISCON PO)   Oral   Take by mouth. Per the patient's daughter she takes 1 by mouth as needed.         Marland Kitchen amLODipine-benazepril (LOTREL) 5-20 MG per capsule      TAKE 1 CAPSULE DAILY.   30 capsule   3   . benazepril (LOTENSIN) 10 MG tablet   Oral   Take 1 tablet (10 mg total) by mouth daily.   30 tablet   11   . calcium carbonate (TUMS - DOSED IN MG ELEMENTAL CALCIUM) 500 MG chewable tablet   Oral   Chew 2 tablets by mouth as needed. For heartburn         . docusate sodium (COLACE) 100 MG capsule   Oral   Take 100 mg by mouth daily.          Marland Kitchen donepezil (ARICEPT) 5 MG tablet   Oral   Take 1 tablet (  5 mg total) by mouth at bedtime.   30 tablet   3     Discontinue exelon patch   . Multiple Vitamin (MULITIVITAMIN WITH MINERALS) TABS   Oral   Take 1 tablet by mouth daily.         . pantoprazole (PROTONIX) 20 MG tablet   Oral   Take 2 tablets (40 mg total) by mouth 2 (two) times daily.   60 tablet   3   . phenylephrine (SUDAFED PE) 10 MG TABS   Oral   Take 10 mg by mouth as needed.         . polyvinyl alcohol (ARTIFICIAL TEARS) 1.4 % ophthalmic solution   Both Eyes   Place 1 drop into both eyes as needed. For dry eyes         . pravastatin (PRAVACHOL) 40 MG tablet   Oral   Take 1 tablet (40 mg total) by mouth every evening.   30 tablet   11    BP 153/65  Pulse 83  Temp(Src) 98.7 F (37.1 C) (Oral)  Resp 18  Ht 5\' 2"  (1.575 m)  Wt 127 lb (57.607 kg)  BMI 23.22 kg/m2  SpO2 97% Physical Exam  Nursing note and vitals reviewed.  79 year old female, resting comfortably and in no acute  distress. Vital signs are significant for hypertension with blood pressure 153/65. Oxygen saturation is 97%, which is normal. Head is normocephalic and atraumatic. PERRLA, EOMI. Oropharynx is clear. Fundi show no hemorrhage, exudate, or papilledema. Temporal arteries are palpable and nontender. Neck is nontender and supple without adenopathy or JVD. Back is nontender and there is no CVA tenderness. Lungs are clear without rales, wheezes, or rhonchi. Chest is nontender. Heart has regular rate and rhythm without murmur. Abdomen is soft, flat, nontender without masses or hepatosplenomegaly and peristalsis is normoactive. Extremities have no cyanosis or edema, full range of motion is present. Skin is warm and dry without rash. Neurologic: She is awake, alert, oriented to person, cranial nerves are intact, there are no motor or sensory deficits.  ED Course  Procedures (including critical care time) Labs Review Results for orders placed during the hospital encounter of 03/14/13  CBC WITH DIFFERENTIAL      Result Value Range   WBC 6.9  4.0 - 10.5 K/uL   RBC 4.38  3.87 - 5.11 MIL/uL   Hemoglobin 14.1  12.0 - 15.0 g/dL   HCT 96.0  45.4 - 09.8 %   MCV 94.5  78.0 - 100.0 fL   MCH 32.2  26.0 - 34.0 pg   MCHC 34.1  30.0 - 36.0 g/dL   RDW 11.9  14.7 - 82.9 %   Platelets 249  150 - 400 K/uL   Neutrophils Relative % 64  43 - 77 %   Neutro Abs 4.4  1.7 - 7.7 K/uL   Lymphocytes Relative 25  12 - 46 %   Lymphs Abs 1.7  0.7 - 4.0 K/uL   Monocytes Relative 8  3 - 12 %   Monocytes Absolute 0.5  0.1 - 1.0 K/uL   Eosinophils Relative 3  0 - 5 %   Eosinophils Absolute 0.2  0.0 - 0.7 K/uL   Basophils Relative 0  0 - 1 %   Basophils Absolute 0.0  0.0 - 0.1 K/uL  BASIC METABOLIC PANEL      Result Value Range   Sodium 139  135 - 145 mEq/L   Potassium 3.8  3.5 - 5.1  mEq/L   Chloride 101  96 - 112 mEq/L   CO2 27  19 - 32 mEq/L   Glucose, Bld 109 (*) 70 - 99 mg/dL   BUN 17  6 - 23 mg/dL   Creatinine,  Ser 1.61  0.50 - 1.10 mg/dL   Calcium 9.7  8.4 - 09.6 mg/dL   GFR calc non Af Amer 54 (*) >90 mL/min   GFR calc Af Amer 63 (*) >90 mL/min  SEDIMENTATION RATE      Result Value Range   Sed Rate 20  0 - 22 mm/hr   MDM   1. Headache    Headache of uncertain cause. CBC, metabolic panel, sedimentation rate will be checked and he'll be given IV fluids and IV metoclopramide and diphenhydramine. Old records are reviewed and she is being followed by her PCP for hypertension and dementia.  She feels much better after the treatment noted above. Laboratory workup is unremarkable. She is discharged with a prescription for metoclopramide.  Dione Booze, MD 03/14/13 [phone]

## 2013-03-14 NOTE — ED Notes (Signed)
Woke 24hours ago with headache, feeling nauseous, felt weak. Daughter was with her and denies any stroke symptoms. Pt feels that the headache got worse after taking a new med (donepezil hcl 5mg ). Now woke again with 10/10 headache and nausea

## 2013-03-18 NOTE — Progress Notes (Signed)
  Subjective:    Patient ID: [name], female    DOB: [birth year], 80 y.o.   MRN: [id number]  HPI    Review of Systems     Objective:   Physical Exam  Decreased though adequate ROM spine, knees and hips.       Assessment & Plan:

## 2013-03-18 NOTE — Assessment & Plan Note (Signed)
Pt has stiffness in back, and hips and some knee pain also. Ambulates with a cane for safety. No h/o falls to present Requests script for cane, same will be provided

## 2013-03-23 ENCOUNTER — Telehealth: Payer: Self-pay | Admitting: Family Medicine

## 2013-03-23 NOTE — Telephone Encounter (Signed)
Pls contact pt  /her daughter re recent ED visit for headache. Treated as a migraine. Pls let them know I am aware, hope she is doing better, and offer fioricet one twice daily as needed, for severe headache #20 only Expect to use no more than twice in a month, advise sleepy s/e  , expect  20 to last for approx 5 monht, let me know res[ponse I will enter script if they want this. She has  been bothered by headaches in the past

## 2013-03-26 NOTE — Telephone Encounter (Signed)
Called and left message for daughter to return call

## 2013-03-27 ENCOUNTER — Telehealth: Payer: Self-pay | Admitting: Family Medicine

## 2013-03-31 ENCOUNTER — Other Ambulatory Visit: Payer: Self-pay | Admitting: Family Medicine

## 2013-04-06 ENCOUNTER — Other Ambulatory Visit: Payer: Self-pay | Admitting: Family Medicine

## 2013-04-06 MED ORDER — RIVASTIGMINE 9.5 MG/24HR TD PT24: 9.5000 mg | MEDICATED_PATCH | Freq: Every day | TRANSDERMAL | Status: DC

## 2013-04-08 NOTE — Telephone Encounter (Signed)
Called and left message for patient to return call.  

## 2013-04-13 ENCOUNTER — Encounter: Payer: Self-pay | Admitting: Family Medicine

## 2013-04-24 NOTE — Telephone Encounter (Signed)
Attempted to reach x 2

## 2013-05-13 ENCOUNTER — Ambulatory Visit: Payer: Medicare PPO | Admitting: Family Medicine

## 2013-05-18 ENCOUNTER — Ambulatory Visit (INDEPENDENT_AMBULATORY_CARE_PROVIDER_SITE_OTHER): Payer: Medicare PPO | Admitting: Internal Medicine

## 2013-05-18 ENCOUNTER — Encounter (INDEPENDENT_AMBULATORY_CARE_PROVIDER_SITE_OTHER): Payer: Self-pay | Admitting: Internal Medicine

## 2013-05-18 ENCOUNTER — Ambulatory Visit: Payer: Medicare PPO | Admitting: Family Medicine

## 2013-05-18 VITALS — BP 142/80 | HR 78 | Temp 98.4°F | Resp 20 | Ht 61.0 in | Wt 124.9 lb

## 2013-05-18 DIAGNOSIS — K5900 Constipation, unspecified: Secondary | ICD-10-CM

## 2013-05-18 DIAGNOSIS — K219 Gastro-esophageal reflux disease without esophagitis: Secondary | ICD-10-CM

## 2013-05-18 DIAGNOSIS — R1013 Epigastric pain: Secondary | ICD-10-CM

## 2013-05-18 MED ORDER — PANTOPRAZOLE SODIUM 20 MG PO TBEC: 40.0000 mg | DELAYED_RELEASE_TABLET | Freq: Two times a day (BID) | ORAL | Status: DC

## 2013-05-18 MED ORDER — SENNA-DOCUSATE SODIUM 8.6-50 MG PO TABS: 1.0000 | ORAL_TABLET | Freq: Two times a day (BID) | ORAL | Status: DC

## 2013-05-18 NOTE — Patient Instructions (Signed)
Please remember to eat greens daily.

## 2013-05-18 NOTE — Progress Notes (Signed)
Presenting complaint;  Followup for epigastric pain and vomiting. History of weight loss.  Subjective:  Patient is 80 year old [place] American female who is here for scheduled visit accompanied by her daughter [name]. She was last seen 4 months ago. She rarely experiences epigastric pain. She has intermittent spitting of of clear or brown fluid. She's had 2 episodes of vomiting since her last visit when she threw up food. These 2 symptoms always occur in the evening. She denies heartburn or dysphagia. She complains of constipation despite taking stool softener. She is using MOM almost every week. Her daughter states that she does not eat green leafy he vegetables. She has gained 3 pounds since her last visit. She has gained 21 pounds in the last 20 months.   Current Medications: Current Outpatient Prescriptions  Medication Sig Dispense Refill  . Acetaminophen-Caffeine (EXCEDRIN ASPIRIN FREE PO) Take 1 tablet by mouth daily as needed. For pain      . Alum Hydroxide-Mag Carbonate (GAVISCON PO) Take by mouth. Per the patient's daughter she takes 1 by mouth as needed.      Marland Kitchen amLODipine-benazepril (LOTREL) 5-20 MG per capsule TAKE 1 CAPSULE DAILY.  30 capsule  3  . benazepril (LOTENSIN) 10 MG tablet TAKE (1) TABLET BY MOUTH ONCE DAILY.  30 tablet  3  . calcium carbonate (TUMS - DOSED IN MG ELEMENTAL CALCIUM) 500 MG chewable tablet Chew 2 tablets by mouth as needed. For heartburn      . docusate sodium (COLACE) 100 MG capsule Take 100 mg by mouth daily.       . Multiple Vitamin (MULITIVITAMIN WITH MINERALS) TABS Take 1 tablet by mouth daily.      . pantoprazole (PROTONIX) 20 MG tablet Take 2 tablets (40 mg total) by mouth 2 (two) times daily.  60 tablet  3  . polyvinyl alcohol (ARTIFICIAL TEARS) 1.4 % ophthalmic solution Place 1 drop into both eyes as needed. For dry eyes      . pravastatin (PRAVACHOL) 40 MG tablet Take 1 tablet (40 mg total) by mouth every evening.  30 tablet  11  . rivastigmine  (EXELON) 9.5 mg/24hr Place 1 patch (9.5 mg total) onto the skin daily.  30 patch  12  . metoCLOPramide (REGLAN) 10 MG tablet Take 1 tablet (10 mg total) by mouth every 6 (six) hours as needed (nausea).  30 tablet  0  . metoCLOPramide (REGLAN) 10 MG tablet Take 1 tablet (10 mg total) by mouth every 6 (six) hours as needed for nausea (or headache).  30 tablet  0  . phenylephrine (SUDAFED PE) 10 MG TABS Take 10 mg by mouth as needed.       No current facility-administered medications for this visit.     Objective: Blood pressure 142/80, pulse 78, temperature 98.4 F (36.9 C), temperature source Oral, resp. rate 20, height 5\' 1"  (1.549 m), weight 124 lb 14.4 oz (56.654 kg). Patient is alert and in no acute distress. Conjunctiva is pink. Sclera is nonicteric Oropharyngeal mucosa is normal. No neck masses or thyromegaly noted. Cardiac exam with regular rhythm normal S1 and S2. No murmur or gallop noted. Lungs are clear to auscultation. Abdomen is symmetrical soft and nontender without organomegaly or masses.  No LE edema or clubbing noted.    Assessment:  #1. Chronic epigastric pain with regurgitation and or vomiting. Workup has been negative but she has responded to double dose PPI. #2. Weight loss has reversed. #3. Constipation. Colace is not working. It appears she is  on a low-residue diet.    Plan:  Discontinue Colace. Peri-Colace one tablet by mouth twice a day. Patient encouraged to eat fiber rich foods including salads. Patient also encouraged to walk or exercise regularly. Office visit in 6 months.

## 2013-06-10 ENCOUNTER — Telehealth: Payer: Self-pay | Admitting: Family Medicine

## 2013-06-10 ENCOUNTER — Encounter (HOSPITAL_COMMUNITY): Payer: Self-pay | Admitting: Emergency Medicine

## 2013-06-10 ENCOUNTER — Emergency Department (HOSPITAL_COMMUNITY)
Admission: EM | Admit: 2013-06-10 | Discharge: 2013-06-10 | Disposition: A | Discharge status: Home / Self Care | Payer: Medicare PPO | Attending: Emergency Medicine | Admitting: Emergency Medicine

## 2013-06-10 DIAGNOSIS — R519 Headache, unspecified: Secondary | ICD-10-CM

## 2013-06-10 DIAGNOSIS — Z8619 Personal history of other infectious and parasitic diseases: Secondary | ICD-10-CM | POA: Insufficient documentation

## 2013-06-10 DIAGNOSIS — Z8551 Personal history of malignant neoplasm of bladder: Secondary | ICD-10-CM | POA: Insufficient documentation

## 2013-06-10 DIAGNOSIS — R51 Headache: Principal | ICD-10-CM

## 2013-06-10 DIAGNOSIS — I10 Essential (primary) hypertension: Secondary | ICD-10-CM | POA: Insufficient documentation

## 2013-06-10 DIAGNOSIS — R1110 Vomiting, unspecified: Secondary | ICD-10-CM | POA: Insufficient documentation

## 2013-06-10 DIAGNOSIS — F0390 Unspecified dementia without behavioral disturbance: Secondary | ICD-10-CM | POA: Insufficient documentation

## 2013-06-10 DIAGNOSIS — K219 Gastro-esophageal reflux disease without esophagitis: Secondary | ICD-10-CM | POA: Insufficient documentation

## 2013-06-10 DIAGNOSIS — Z79899 Other long term (current) drug therapy: Secondary | ICD-10-CM | POA: Insufficient documentation

## 2013-06-10 DIAGNOSIS — R1084 Generalized abdominal pain: Secondary | ICD-10-CM | POA: Insufficient documentation

## 2013-06-10 LAB — URINALYSIS, ROUTINE W REFLEX MICROSCOPIC
Bilirubin Urine: NEGATIVE
GLUCOSE, UA: NEGATIVE mg/dL
Leukocytes, UA: NEGATIVE
Nitrite: NEGATIVE
Protein, ur: 100 mg/dL — AB
Specific Gravity, Urine: 1.025 (ref 1.005–1.030)
Urobilinogen, UA: 0.2 mg/dL (ref 0.0–1.0)
pH: 8 (ref 5.0–8.0)

## 2013-06-10 LAB — CBC WITH DIFFERENTIAL/PLATELET
BASOS ABS: 0 10*3/uL (ref 0.0–0.1)
BASOS PCT: 0 % (ref 0–1)
EOS ABS: 0.1 10*3/uL (ref 0.0–0.7)
EOS PCT: 1 % (ref 0–5)
HEMATOCRIT: 44.8 % (ref 36.0–46.0)
Hemoglobin: 15.4 g/dL — ABNORMAL HIGH (ref 12.0–15.0)
LYMPHS PCT: 24 % (ref 12–46)
Lymphs Abs: 1.6 10*3/uL (ref 0.7–4.0)
MCH: 32 pg (ref 26.0–34.0)
MCHC: 34.4 g/dL (ref 30.0–36.0)
MCV: 93.1 fL (ref 78.0–100.0)
MONO ABS: 0.6 10*3/uL (ref 0.1–1.0)
Monocytes Relative: 10 % (ref 3–12)
Neutro Abs: 4.2 10*3/uL (ref 1.7–7.7)
Neutrophils Relative %: 65 % (ref 43–77)
PLATELETS: 257 10*3/uL (ref 150–400)
RBC: 4.81 MIL/uL (ref 3.87–5.11)
RDW: 13.3 % (ref 11.5–15.5)
WBC: 6.5 10*3/uL (ref 4.0–10.5)

## 2013-06-10 LAB — URINE MICROSCOPIC-ADD ON

## 2013-06-10 LAB — TROPONIN I: Troponin I: <0.3 ng/mL (ref <0.30)

## 2013-06-10 LAB — COMPREHENSIVE METABOLIC PANEL
ALBUMIN: 4.5 g/dL (ref 3.5–5.2)
ALT: 10 U/L (ref 0–35)
AST: 28 U/L (ref 0–37)
Alkaline Phosphatase: 83 U/L (ref 39–117)
BILIRUBIN TOTAL: 0.9 mg/dL (ref 0.3–1.2)
BUN: 11 mg/dL (ref 6–23)
CO2: 29 mEq/L (ref 19–32)
Calcium: 9.7 mg/dL (ref 8.4–10.5)
Chloride: 101 mEq/L (ref 96–112)
Creatinine, Ser: 0.98 mg/dL (ref 0.50–1.10)
GFR calc Af Amer: 62 mL/min — ABNORMAL LOW (ref >90)
GFR calc non Af Amer: 54 mL/min — ABNORMAL LOW (ref >90)
Glucose, Bld: 139 mg/dL — ABNORMAL HIGH (ref 70–99)
POTASSIUM: 3.7 meq/L (ref 3.7–5.3)
SODIUM: 143 meq/L (ref 137–147)
TOTAL PROTEIN: 8.6 g/dL — AB (ref 6.0–8.3)

## 2013-06-10 LAB — LIPASE, BLOOD: Lipase: 38 U/L (ref 11–59)

## 2013-06-10 LAB — SEDIMENTATION RATE: Sed Rate: 12 mm/hr (ref 0–22)

## 2013-06-10 MED ORDER — DIPHENHYDRAMINE HCL 50 MG/ML IJ SOLN
25.0000 mg | Freq: Once | INTRAMUSCULAR | Status: AC
  Administered 2013-06-10: 25 mg via INTRAVENOUS
  Filled 2013-06-10: qty 1

## 2013-06-10 MED ORDER — PROCHLORPERAZINE EDISYLATE 5 MG/ML IJ SOLN
5.0000 mg | Freq: Once | INTRAMUSCULAR | Status: AC
  Administered 2013-06-10: 5 mg via INTRAVENOUS
  Filled 2013-06-10: qty 2

## 2013-06-10 NOTE — ED Provider Notes (Signed)
CSN: [id number]     Arrival date & time 06/10/13  0418 History   First MD Initiated Contact with Patient 06/10/13 [phone]     Chief Complaint  Patient presents with  . Emesis  . Headache   level V caveat patient demented history is obtained from patient and from patient's daughter who accompanies her (Consider location/radiation/quality/duration/timing/severity/associated sxs/prior Treatment) HPI Complains of headache onset sometime yesterday, patient has also had vomiting 3 or 4 times since 6 PM yesterday. Patient gets headaches approximately every 2 weeks for the past 2 years. Her daughter reports she's had multiple CT scans and MRI scans of the brain for headaches, all of which were unremarkable. She was treated with Reglan 12 midnight, without relief. Patient also complains of diffuse abdominal pain. Last bowel movement was yesterday, hard. No blood per rectum. No fever. No chest pain No other associated symptoms. Past Medical History  Diagnosis Date  . Heart palpitations     PAC's   . Hypertension   . Anxiety   . GERD (gastroesophageal reflux disease)   . Helicobacter pylori (H. pylori)     treated in 2009  . Bladder cancer    Past Surgical History  Procedure Laterality Date  . Cholecystectomy  1998  . Bladder surgey  2009  . Cataract extraction  2 years ago    both eyes  . Esophagogastroduodenoscopy  10/19/2011    Procedure: ESOPHAGOGASTRODUODENOSCOPY (EGD);  Surgeon: Rogene Houston, MD;  Location: [facility];  Service: Endoscopy;  Laterality: N/A;  730   Family History  Problem Relation Age of Onset  . Hypertension Mother   . Diabetes Mother   . Pneumonia Mother   . Diabetes Father   . Stroke Sister   . Stroke Brother   . Hypertension Daughter   . Obesity Daughter   . Diabetes Daughter    History  Substance Use Topics  . Smoking status: Never Smoker   . Smokeless tobacco: Never Used  . Alcohol Use: No   OB History   Grav Para Term Preterm Abortions TAB SAB Ect  Mult Living                 Review of Systems  Unable to perform ROS: Dementia  Gastrointestinal: Positive for abdominal pain.  Neurological: Positive for headaches.    Allergies  Codeine and Sulfonamide derivatives  Home Medications   Current Outpatient Rx  Name  Route  Sig  Dispense  Refill  . Acetaminophen-Caffeine (EXCEDRIN ASPIRIN FREE PO)   Oral   Take 1 tablet by mouth daily as needed. For pain         . Alum Hydroxide-Mag Carbonate (GAVISCON PO)   Oral   Take by mouth. Per the patient's daughter she takes 1 by mouth as needed.         Marland Kitchen amLODipine-benazepril (LOTREL) 5-20 MG per capsule      TAKE 1 CAPSULE DAILY.   30 capsule   3   . benazepril (LOTENSIN) 10 MG tablet      TAKE (1) TABLET BY MOUTH ONCE DAILY.   30 tablet   3   . calcium carbonate (TUMS - DOSED IN MG ELEMENTAL CALCIUM) 500 MG chewable tablet   Oral   Chew 2 tablets by mouth as needed. For heartburn         . Multiple Vitamin (MULITIVITAMIN WITH MINERALS) TABS   Oral   Take 1 tablet by mouth daily.         Marland Kitchen  pantoprazole (PROTONIX) 20 MG tablet   Oral   Take 2 tablets (40 mg total) by mouth 2 (two) times daily.   60 tablet   5   . phenylephrine (SUDAFED PE) 10 MG TABS   Oral   Take 10 mg by mouth as needed.         . polyvinyl alcohol (ARTIFICIAL TEARS) 1.4 % ophthalmic solution   Both Eyes   Place 1 drop into both eyes as needed. For dry eyes         . rivastigmine (EXELON) 9.5 mg/24hr   Transdermal   Place 1 patch (9.5 mg total) onto the skin daily.   30 patch   12     Discontinue aricept effective 04/06/2013   . sennosides-docusate sodium (SENOKOT-S) 8.6-50 MG tablet   Oral   Take 1 tablet by mouth 2 (two) times daily.         Marland Kitchen EXPIRED: pravastatin (PRAVACHOL) 40 MG tablet   Oral   Take 1 tablet (40 mg total) by mouth every evening.   30 tablet   11    BP 158/89  Pulse 81  Temp(Src) 98 F (36.7 C) (Oral)  Resp 19  Ht 5\' 2"  (1.575 m)  Wt 125 lb  (56.7 kg)  BMI 22.86 kg/m2  SpO2 99% Physical Exam  Nursing note and vitals reviewed. Constitutional: She appears well-developed and well-nourished. No distress.  HENT:  Head: Normocephalic and atraumatic.  Eyes: Conjunctivae are normal. Pupils are equal, round, and reactive to light.  No subconjunctival erythema. Quality is appear grossly normal  Neck: Neck supple. No tracheal deviation present. No thyromegaly present.  Cardiovascular: Normal rate and regular rhythm.   No murmur heard. Pulmonary/Chest: Effort normal and breath sounds normal.  Abdominal: Soft. Bowel sounds are normal. She exhibits no distension. There is no tenderness.  Musculoskeletal: Normal range of motion. She exhibits no edema and no tenderness.  Neurological: She is alert. She has normal reflexes. No cranial nerve deficit. Coordination normal.  Gait normal  Skin: Skin is warm and dry. No rash noted.  Psychiatric: She has a normal mood and affect.    ED Course  Procedures (including critical care time) Labs Review Labs Reviewed  COMPREHENSIVE METABOLIC PANEL  CBC WITH DIFFERENTIAL  LIPASE, BLOOD  TROPONIN I  SEDIMENTATION RATE  URINALYSIS, ROUTINE W REFLEX MICROSCOPIC   Imaging Review No results found.  EKG Interpretation   None      Date: 06/10/2013  Rate: 70  Rhythm: normal sinus rhythm  QRS Axis: normal  Intervals: normal  ST/T Wave abnormalities: normal  Conduction Disutrbances:none  Narrative Interpretation:   Old EKG Reviewed: Unchanged from 07/23/2012 interpreted by me   Results for orders placed during the hospital encounter of 06/10/13  COMPREHENSIVE METABOLIC PANEL      Result Value Range   Sodium 143  137 - 147 mEq/L   Potassium 3.7  3.7 - 5.3 mEq/L   Chloride 101  96 - 112 mEq/L   CO2 29  19 - 32 mEq/L   Glucose, Bld 139 (*) 70 - 99 mg/dL   BUN 11  6 - 23 mg/dL   Creatinine, Ser 0.98  0.50 - 1.10 mg/dL   Calcium 9.7  8.4 - 10.5 mg/dL   Total Protein 8.6 (*) 6.0 - 8.3 g/dL    Albumin 4.5  3.5 - 5.2 g/dL   AST 28  0 - 37 U/L   ALT 10  0 - 35 U/L   Alkaline Phosphatase  83  39 - 117 U/L   Total Bilirubin 0.9  0.3 - 1.2 mg/dL   GFR calc non Af Amer 54 (*) >90 mL/min   GFR calc Af Amer 62 (*) >90 mL/min  CBC WITH DIFFERENTIAL      Result Value Range   WBC 6.5  4.0 - 10.5 K/uL   RBC 4.81  3.87 - 5.11 MIL/uL   Hemoglobin 15.4 (*) 12.0 - 15.0 g/dL   HCT 44.8  36.0 - 46.0 %   MCV 93.1  78.0 - 100.0 fL   MCH 32.0  26.0 - 34.0 pg   MCHC 34.4  30.0 - 36.0 g/dL   RDW 13.3  11.5 - 15.5 %   Platelets 257  150 - 400 K/uL   Neutrophils Relative % 65  43 - 77 %   Neutro Abs 4.2  1.7 - 7.7 K/uL   Lymphocytes Relative 24  12 - 46 %   Lymphs Abs 1.6  0.7 - 4.0 K/uL   Monocytes Relative 10  3 - 12 %   Monocytes Absolute 0.6  0.1 - 1.0 K/uL   Eosinophils Relative 1  0 - 5 %   Eosinophils Absolute 0.1  0.0 - 0.7 K/uL   Basophils Relative 0  0 - 1 %   Basophils Absolute 0.0  0.0 - 0.1 K/uL  LIPASE, BLOOD      Result Value Range   Lipase 38  11 - 59 U/L  TROPONIN I      Result Value Range   Troponin I <0.30  <0.30 ng/mL  SEDIMENTATION RATE      Result Value Range   Sed Rate 12  0 - 22 mm/hr  URINALYSIS, ROUTINE W REFLEX MICROSCOPIC      Result Value Range   Color, Urine YELLOW  YELLOW   APPearance CLEAR  CLEAR   Specific Gravity, Urine 1.025  1.005 - 1.030   pH 8.0  5.0 - 8.0   Glucose, UA NEGATIVE  NEGATIVE mg/dL   Hgb urine dipstick MODERATE (*) NEGATIVE   Bilirubin Urine NEGATIVE  NEGATIVE   Ketones, ur TRACE (*) NEGATIVE mg/dL   Protein, ur 100 (*) NEGATIVE mg/dL   Urobilinogen, UA 0.2  0.0 - 1.0 mg/dL   Nitrite NEGATIVE  NEGATIVE   Leukocytes, UA NEGATIVE  NEGATIVE  URINE MICROSCOPIC-ADD ON      Result Value Range   Squamous Epithelial / LPF MANY (*) RARE   WBC, UA 0-2  <3 WBC/hpf   RBC / HPF TOO NUMEROUS TO COUNT  <3 RBC/hpf   Bacteria, UA MANY (*) RARE   Urine-Other MUCOUS PRESENT     No results found.  5:17 AM patient states he feels better  after treatment with intravenous Compazine and Benadryl. She is resting comfortably. 6:05 AM patient states," I feel much better thank you." She is alert appears comfortable. She drank several ounces of water without difficulty. MDM  No diagnosis found. Urinalysis is contaminated. In light of no definite urinary symptoms, we'll not obtain catheterized specimen. There is no pyuria even in this contaminated specimen.  Plan follow up with Dr. Moshe Cipro. Blood sugar recheck or hemoglobin A1c Diagnosis #1 nonspecific headache #2 nonspecific abdominal pain #3 hyperglycemia  Orlie Dakin, MD 06/10/13 [phone]

## 2013-06-10 NOTE — Telephone Encounter (Signed)
Pls refer pt to Dr Merlene Laughter for evaluation of headaches, daughter  is aware of referral and agrees,( she was in the Ed today.) Pt also needs OV scheduled  here in next 3 weeks approx f/u  With memory evaluation, I will let nurse know if pt will need labs drawn before visit here so they will let pt know and order

## 2013-06-10 NOTE — Discharge Instructions (Signed)
Ms. Spallone blood sugar today was mildly elevated at 139. Call Dr. Moshe Cipro to schedule office appointment. Her blood sugar should be rechecked or he should have a test known as hemoglobin A1c but can be done in the office to check for diabetes.

## 2013-06-10 NOTE — ED Notes (Signed)
Patient states that her head hurts and her stomach hurts. Patient having vomiting since around 6 p.m. Patient states that her bowel movements have been hard.

## 2013-06-12 ENCOUNTER — Other Ambulatory Visit: Payer: Self-pay | Admitting: Family Medicine

## 2013-07-01 ENCOUNTER — Encounter: Payer: Self-pay | Admitting: Family Medicine

## 2013-07-01 ENCOUNTER — Ambulatory Visit (INDEPENDENT_AMBULATORY_CARE_PROVIDER_SITE_OTHER): Payer: Medicare PPO | Admitting: Family Medicine

## 2013-07-01 VITALS — BP 150/74 | HR 75 | Resp 16 | Ht 61.0 in | Wt 121.4 lb

## 2013-07-01 DIAGNOSIS — R51 Headache: Secondary | ICD-10-CM

## 2013-07-01 DIAGNOSIS — G309 Alzheimer's disease, unspecified: Principal | ICD-10-CM

## 2013-07-01 DIAGNOSIS — Z1239 Encounter for other screening for malignant neoplasm of breast: Secondary | ICD-10-CM

## 2013-07-01 DIAGNOSIS — E785 Hyperlipidemia, unspecified: Secondary | ICD-10-CM

## 2013-07-01 DIAGNOSIS — F0280 Dementia in other diseases classified elsewhere without behavioral disturbance: Secondary | ICD-10-CM

## 2013-07-01 DIAGNOSIS — I10 Essential (primary) hypertension: Secondary | ICD-10-CM

## 2013-07-01 MED ORDER — BUTALBITAL-APAP-CAFFEINE 50-325-40 MG PO TABS: ORAL_TABLET | ORAL | Status: DC

## 2013-07-01 MED ORDER — AMLODIPINE BESY-BENAZEPRIL HCL 5-40 MG PO CAPS: 1.0000 | ORAL_CAPSULE | Freq: Every day | ORAL | Status: DC

## 2013-07-01 NOTE — Patient Instructions (Addendum)
Pelvic and breast in 4 month, call if you need me before  Two month follow up for review of BP with MD  Mammogram will be scheduled before you leave  Fasting lipid and cmp as soon as able  Trial of a new medication till you see the neurologist, use no more than 3 times per week. Try to reduce how often you take tylenol for headache as this causes rebound headaches  New is ONE pill for blood pressure, once daily, amlodipine [name] 5/40 ONE daily , start this today please

## 2013-07-02 NOTE — Assessment & Plan Note (Signed)
Pt's daughter declined a rept scoring of her MMSE . She is doing well with the exelon patch at standard dose and will continue this

## 2013-07-02 NOTE — Assessment & Plan Note (Signed)
Daily disabling headaches, uses tylenol daily with frequent trips to Ed. Education done re need to reduce tylenol use, try compresses to head and script provided with limited amt of fioricet, she has upcoming neurology appt also

## 2013-07-02 NOTE — Assessment & Plan Note (Signed)
Uncontrolled, change in med dosage DASH diet and commitment to daily physical activity for a minimum of 30 minutes discussed and encouraged, as a part of hypertension management. The importance of attaining a healthy weight is also discussed.

## 2013-07-02 NOTE — Assessment & Plan Note (Signed)
Uncontrolled when last checked Updated lab needed at/ before next visit. Hyperlipidemia:Low fat diet discussed and encouraged.

## 2013-07-02 NOTE — Progress Notes (Signed)
   Subjective:    Patient ID: [name], female    DOB: [birth year], 80 y.o.   MRN: [id number]  HPI The PT is here for follow up and re-evaluation of chronic medical conditions, medication management and review of any available recent lab and radiology data.  Preventive health is updated, specifically  Cancer screening and Immunization.   Recently seen in the ED for headache, which has been an ongoing problem.Has upcoming neurology eval The PT denies any adverse reactions to current medications since the last visit. Tolerating exelon patch There are no new concerns.  There are no specific complaints       Review of Systems See HPI, most of history confirmed with her daughter who is her caregiver as pt is an unreliable historian due to her ehalth Denies recent fever or chills. Denies sinus pressure, nasal congestion, ear pain or sore throat. Denies chest congestion, productive cough or wheezing. Denies chest pains, palpitations and leg swelling Denies abdominal pain, nausea, vomiting,diarrhea or constipation.   Denies dysuria, frequency, hesitancy or incontinence. Denies joint pain, swelling and limitation in mobility. Denies , seizures, numbness, or tingling. Denies depression, anxiety or insomnia. Denies skin break down or rash.        Objective:   Physical Exam BP 150/74  Pulse 75  Resp 16  Ht 5\' 1"  (1.549 m)  Wt 121 lb 6.4 oz (55.067 kg)  BMI 22.95 kg/m2  SpO2 99% Patient alert and in no cardiopulmonary distress.Pt in no pain  HEENT: No facial asymmetry, EOMI, no sinus tenderness,  oropharynx pink and moist.  Neck supple no adenopathy.  Chest: Clear to auscultation bilaterally.  CVS: S1, S2 no murmurs, no S3.  ABD: Soft non tender. Bowel sounds normal.  Ext: No edema  MS: Adequate ROM spine, shoulders, hips and knees.  Skin: Intact, no ulcerations or rash noted.  Psych: Good eye contact, normal affect. Memory loss not anxious or depressed  appearing.  CNS: CN 2-12 intact, power, tone and sensation normal throughout.        Assessment & Plan:  HYPERTENSION Uncontrolled, change in med dosage DASH diet and commitment to daily physical activity for a minimum of 30 minutes discussed and encouraged, as a part of hypertension management. The importance of attaining a healthy weight is also discussed.   Headache(784.0) Daily disabling headaches, uses tylenol daily with frequent trips to Ed. Education done re need to reduce tylenol use, try compresses to head and script provided with limited amt of fioricet, she has upcoming neurology appt also  HYPERLIPIDEMIA Uncontrolled when last checked Updated lab needed at/ before next visit. Hyperlipidemia:Low fat diet discussed and encouraged.    Alzheimer's disease Pt's daughter declined a rept scoring of her MMSE . She is doing well with the exelon patch at standard dose and will continue this

## 2013-07-20 LAB — LIPID PANEL
Cholesterol: 151 mg/dL (ref 0–200)
HDL: 55 mg/dL (ref >39)
LDL Cholesterol: 83 mg/dL (ref 0–99)
TRIGLYCERIDES: 67 mg/dL (ref <150)
Total CHOL/HDL Ratio: 2.7 Ratio
VLDL: 13 mg/dL (ref 0–40)

## 2013-07-20 LAB — COMPREHENSIVE METABOLIC PANEL
ALT: 8 U/L (ref 0–35)
AST: 19 U/L (ref 0–37)
Albumin: 4.2 g/dL (ref 3.5–5.2)
Alkaline Phosphatase: 57 U/L (ref 39–117)
BUN: 12 mg/dL (ref 6–23)
CALCIUM: 9.7 mg/dL (ref 8.4–10.5)
CHLORIDE: 103 meq/L (ref 96–112)
CO2: 32 meq/L (ref 19–32)
Creat: 1.21 mg/dL — ABNORMAL HIGH (ref 0.50–1.10)
Glucose, Bld: 84 mg/dL (ref 70–99)
Potassium: 4.6 mEq/L (ref 3.5–5.3)
SODIUM: 141 meq/L (ref 135–145)
Total Bilirubin: 0.9 mg/dL (ref 0.2–1.2)
Total Protein: 6.6 g/dL (ref 6.0–8.3)

## 2013-07-21 ENCOUNTER — Encounter: Payer: Self-pay | Admitting: Family Medicine

## 2013-07-29 ENCOUNTER — Encounter: Payer: Self-pay | Admitting: Family Medicine

## 2013-07-30 ENCOUNTER — Encounter (HOSPITAL_COMMUNITY): Payer: Self-pay | Admitting: Emergency Medicine

## 2013-07-30 ENCOUNTER — Emergency Department (HOSPITAL_COMMUNITY)
Admission: EM | Admit: 2013-07-30 | Discharge: 2013-07-30 | Disposition: A | Discharge status: Home / Self Care | Payer: Medicare PPO | Attending: Emergency Medicine | Admitting: Emergency Medicine

## 2013-07-30 ENCOUNTER — Emergency Department (HOSPITAL_COMMUNITY): Payer: Medicare PPO

## 2013-07-30 ENCOUNTER — Telehealth (INDEPENDENT_AMBULATORY_CARE_PROVIDER_SITE_OTHER): Payer: Self-pay | Admitting: *Deleted

## 2013-07-30 DIAGNOSIS — K219 Gastro-esophageal reflux disease without esophagitis: Secondary | ICD-10-CM | POA: Insufficient documentation

## 2013-07-30 DIAGNOSIS — Z79899 Other long term (current) drug therapy: Secondary | ICD-10-CM | POA: Insufficient documentation

## 2013-07-30 DIAGNOSIS — R002 Palpitations: Secondary | ICD-10-CM | POA: Insufficient documentation

## 2013-07-30 DIAGNOSIS — Z7982 Long term (current) use of aspirin: Secondary | ICD-10-CM | POA: Insufficient documentation

## 2013-07-30 DIAGNOSIS — I10 Essential (primary) hypertension: Secondary | ICD-10-CM | POA: Insufficient documentation

## 2013-07-30 DIAGNOSIS — Z8619 Personal history of other infectious and parasitic diseases: Secondary | ICD-10-CM | POA: Insufficient documentation

## 2013-07-30 DIAGNOSIS — Z8551 Personal history of malignant neoplasm of bladder: Secondary | ICD-10-CM | POA: Insufficient documentation

## 2013-07-30 DIAGNOSIS — Z8659 Personal history of other mental and behavioral disorders: Secondary | ICD-10-CM | POA: Insufficient documentation

## 2013-07-30 MED ORDER — GI COCKTAIL ~~LOC~~
30.0000 mL | Freq: Once | ORAL | Status: AC
  Administered 2013-07-30: 30 mL via ORAL
  Filled 2013-07-30: qty 30

## 2013-07-30 NOTE — Telephone Encounter (Signed)
[name] called back. She has taken her mother to the ED at [facility].

## 2013-07-30 NOTE — Discharge Instructions (Signed)
Diet for Gastroesophageal Reflux Disease, Adult Reflux (acid reflux) is when acid from your stomach flows up into the esophagus. When acid comes in contact with the esophagus, the acid causes irritation and soreness (inflammation) in the esophagus. When reflux happens often or so severely that it causes damage to the esophagus, it is called gastroesophageal reflux disease (GERD). Nutrition therapy can help ease the discomfort of GERD. FOODS OR DRINKS TO AVOID OR LIMIT  Smoking or chewing tobacco. Nicotine is one of the most potent stimulants to acid production in the gastrointestinal tract.  Caffeinated and decaffeinated coffee and black tea.  Regular or low-calorie carbonated beverages or energy drinks (caffeine-free carbonated beverages are allowed).   Strong spices, such as black pepper, white pepper, red pepper, cayenne, curry powder, and chili powder.  Peppermint or spearmint.  Chocolate.  High-fat foods, including meats and fried foods. Extra added fats including oils, butter, salad dressings, and nuts. Limit these to less than 8 tsp per day.  Fruits and vegetables if they are not tolerated, such as citrus fruits or tomatoes.  Alcohol.  Any food that seems to aggravate your condition. If you have questions regarding your diet, call your caregiver or a registered dietitian. OTHER THINGS THAT MAY HELP GERD INCLUDE:   Eating your meals slowly, in a relaxed setting.  Eating 5 to 6 small meals per day instead of 3 large meals.  Eliminating food for a period of time if it causes distress.  Not lying down until 3 hours after eating a meal.  Keeping the head of your bed raised 6 to 9 inches (15 to 23 cm) by using a foam wedge or blocks under the legs of the bed. Lying flat may make symptoms worse.  Being physically active. Weight loss may be helpful in reducing reflux in overweight or obese adults.  Wear loose fitting clothing EXAMPLE MEAL PLAN This meal plan is approximately  2,000 calories based on [email or web address] meal planning guidelines. Breakfast   cup cooked oatmeal.  1 cup strawberries.  1 cup low-fat milk.  1 oz almonds. Snack  1 cup cucumber slices.  6 oz yogurt (made from low-fat or fat-free milk). Lunch  2 slice whole-wheat bread.  2 oz sliced [place].  2 tsp mayonnaise.  1 cup blueberries.  1 cup snap peas. Snack  6 whole-wheat crackers.  1 oz string cheese. Dinner   cup brown rice.  1 cup mixed veggies.  1 tsp olive oil.  3 oz grilled fish. Document Released: 04/23/2005 Document Revised: 07/16/2011 Document Reviewed: 03/09/2011 [facility] Patient Information 2014 [place], [place].  X-rays that we did were negative followup with your doctor her GI Dr. as needed. Return for any newer worse symptoms.

## 2013-07-30 NOTE — ED Provider Notes (Signed)
CSN: [id number]     Arrival date & time 07/30/13  1348 History   First MD Initiated Contact with Patient 07/30/13 1526     Chief Complaint  Patient presents with  . Gastrophageal Reflux     (Consider location/radiation/quality/duration/timing/severity/associated sxs/prior Treatment) Patient is a 80 y.o. female presenting with GERD. The history is provided by the patient and a relative.  Gastrophageal Reflux Associated symptoms include abdominal pain. Pertinent negatives include no chest pain, no headaches and no shortness of breath.   patient with a history of gastroesophageal reflux. Patient with onset of epigastric abdominal pain at 10:00 this morning. Family member states that can come sin for this and it really just her reflux is acting up. Patient states that the pain is a 10. Pain is nonradiating no nausea no vomiting no diarrhea no fevers no chest pain. Patient is already on reflux medicine Protonix. Patient had upper endoscopy done in 2013 did not show any ulcers. Family states she usually gets a GI cocktail and she feels better and they discharge her home.  Past Medical History  Diagnosis Date  . Heart palpitations     PAC's   . Hypertension   . Anxiety   . GERD (gastroesophageal reflux disease)   . Helicobacter pylori (H. pylori)     treated in 2009  . Bladder cancer    Past Surgical History  Procedure Laterality Date  . Cholecystectomy  1998  . Bladder surgey  2009  . Cataract extraction  2 years ago    both eyes  . Esophagogastroduodenoscopy  10/19/2011    Procedure: ESOPHAGOGASTRODUODENOSCOPY (EGD);  Surgeon: Rogene Houston, MD;  Location: [facility];  Service: Endoscopy;  Laterality: N/A;  730   Family History  Problem Relation Age of Onset  . Hypertension Mother   . Diabetes Mother   . Pneumonia Mother   . Diabetes Father   . Stroke Sister   . Stroke Brother   . Hypertension Daughter   . Obesity Daughter   . Diabetes Daughter    History  Substance Use  Topics  . Smoking status: Never Smoker   . Smokeless tobacco: Never Used  . Alcohol Use: No   OB History   Grav Para Term Preterm Abortions TAB SAB Ect Mult Living                 Review of Systems  Constitutional: Negative for fever.  HENT: Negative for congestion.   Eyes: Negative for visual disturbance.  Respiratory: Negative for shortness of breath.   Cardiovascular: Negative for chest pain.  Gastrointestinal: Positive for abdominal pain. Negative for nausea, vomiting and diarrhea.  Genitourinary: Negative for dysuria.  Musculoskeletal: Negative for back pain.  Skin: Negative for rash.  Neurological: Negative for headaches.  Hematological: Does not bruise/bleed easily.  Psychiatric/Behavioral: Negative for confusion.      Allergies  Codeine and Sulfonamide derivatives  Home Medications   Current Outpatient Rx  Name  Route  Sig  Dispense  Refill  . Alum Hydroxide-Mag Carbonate (GAVISCON PO)   Oral   Take 10 mLs by mouth daily as needed (acid reflux). Per the patient's daughter she takes 1 by mouth as needed.         Marland Kitchen amLODipine-benazepril (LOTREL) 5-40 MG per capsule   Oral   Take 1 capsule by mouth daily.   30 capsule   5     Discontinue effective 07/01/2013 lotrel5/20 and be ...   . aspirin EC 81 MG tablet  Oral   Take 81 mg by mouth daily.         . butalbital-acetaminophen-caffeine (FIORICET, ESGIC) 50-325-40 MG per tablet      One tablet once daily, as needed, for severe headache  Maximum of 10 tablets to last 30 days   10 tablet   0     Ten tablets to last 30 days   . docusate sodium (COLACE) 100 MG capsule   Oral   Take 100 mg by mouth as needed for mild constipation.         . megestrol (MEGACE) 40 MG/ML suspension   Oral   Take 40 mg by mouth daily.         . Multiple Vitamin (MULITIVITAMIN WITH MINERALS) TABS   Oral   Take 1 tablet by mouth daily.         . pantoprazole (PROTONIX) 20 MG tablet   Oral   Take 2 tablets  (40 mg total) by mouth 2 (two) times daily.   60 tablet   5   . polyvinyl alcohol (ARTIFICIAL TEARS) 1.4 % ophthalmic solution   Both Eyes   Place 1 drop into both eyes as needed. For dry eyes         . pravastatin (PRAVACHOL) 40 MG tablet   Oral   Take 1 tablet (40 mg total) by mouth every evening.   30 tablet   11   . rivastigmine (EXELON) 9.5 mg/24hr   Transdermal   Place 1 patch (9.5 mg total) onto the skin daily.   30 patch   12     Discontinue aricept effective 04/06/2013   . calcium carbonate (TUMS - DOSED IN MG ELEMENTAL CALCIUM) 500 MG chewable tablet   Oral   Chew 2 tablets by mouth as needed. For heartburn          BP 135/49  Pulse 78  Temp(Src) 98.7 F (37.1 C) (Oral)  Resp 20  Ht 5\' 2"  (1.575 m)  Wt 123 lb (55.792 kg)  BMI 22.49 kg/m2  SpO2 99% Physical Exam  Nursing note and vitals reviewed. Constitutional: She is oriented to person, place, and time. She appears well-developed and well-nourished. No distress.  HENT:  Head: Normocephalic and atraumatic.  Mouth/Throat: Oropharynx is clear and moist.  Eyes: Conjunctivae and EOM are normal. Pupils are equal, round, and reactive to light.  Neck: Normal range of motion. Neck supple.  Cardiovascular: Normal rate, regular rhythm and normal heart sounds.   Pulmonary/Chest: Effort normal and breath sounds normal.  Abdominal: Soft. Bowel sounds are normal. There is no tenderness.  Musculoskeletal: Normal range of motion. She exhibits no edema.  Neurological: She is alert and oriented to person, place, and time. No cranial nerve deficit. She exhibits normal muscle tone. Coordination normal.  Skin: Skin is warm. No rash noted.    ED Course  Procedures (including critical care time) Labs Review Labs Reviewed - No data to display Imaging Review Dg Abd Acute W/chest  07/30/2013   CLINICAL DATA:  Gastroesophageal reflux.  Abdominal pain for 1 week  EXAM: ACUTE ABDOMEN SERIES (ABDOMEN 2 VIEW & CHEST 1 VIEW)   COMPARISON:  07/23/2012 chest x-ray  FINDINGS: The lungs are well aerated. No effusion or pneumothorax. Normal heart size. Aortic atherosclerosis.  No evidence for bowel obstruction. Cholecystectomy changes. No pneumoperitoneum. No abnormal intra-abdominal mass effect or calcification. No acute osseous findings.  IMPRESSION: Negative abdominal radiographs.  No acute cardiopulmonary disease.   Electronically Signed   By: Roderic Palau  Watts M.D.   On: 07/30/2013 16:27     EKG Interpretation   Date/Time:  Thursday July 30 2013 15:13:37 EDT Ventricular Rate:  74 PR Interval:  158 QRS Duration: 70 QT Interval:  376 QTC Calculation: 417 R Axis:   63 Text Interpretation:  Sinus rhythm with Premature supraventricular  complexes Otherwise normal ECG When compared with ECG of 10-Jun-2013  05:00, Premature supraventricular complexes are now Present Confirmed by  Ariadna Setter  MD, [facility] [phone]) on 07/30/2013 3:27:58 PM      MDM   Final diagnoses:  Gastroesophageal reflux    Patient with a history of some dementia. Patient with complaint of epigastric abdominal pain that started around 10 in the morning. She is known to have GE reflux. Is on medication for that daughter states that she tends to want to come in when she gets this pain but it usually does give her a GI cocktail. Patient's nontoxic no acute distress. Belly was nontender to palpation. Acute abdominal series was negative for any free air. Patient was given GI cocktail and all symptoms resolved. Patient's followed by GI medicine and also Dr. Moshe Cipro.    Mervin Kung, MD 07/30/13 [phone]

## 2013-07-30 NOTE — Telephone Encounter (Signed)
[name] LM stating her mother is having trouble with reflux and would like to know if we could see her today, 07/20/13. Per Karna Christmas, have her to come by the office and she will give her samples of Dexilant. Call the home and cell number listed that went start to voice mail. LM asking [name] to return the call.

## 2013-07-30 NOTE — ED Notes (Addendum)
Pt reports "acid reflux" and 10/10 epigastric pain since this morning. She took megestrol and pantoprazole without relief. Has hx of GERD and states that pain is the same. Denies SOB, CP and radiation of pain.

## 2013-09-30 ENCOUNTER — Emergency Department (HOSPITAL_COMMUNITY)
Admission: EM | Admit: 2013-09-30 | Discharge: 2013-09-30 | Disposition: A | Discharge status: Home / Self Care | Payer: Medicare PPO | Attending: Emergency Medicine | Admitting: Emergency Medicine

## 2013-09-30 ENCOUNTER — Emergency Department (HOSPITAL_COMMUNITY): Payer: Medicare PPO

## 2013-09-30 ENCOUNTER — Encounter (HOSPITAL_COMMUNITY): Payer: Self-pay | Admitting: Emergency Medicine

## 2013-09-30 DIAGNOSIS — S0003XA Contusion of scalp, initial encounter: Secondary | ICD-10-CM | POA: Insufficient documentation

## 2013-09-30 DIAGNOSIS — Y9389 Activity, other specified: Secondary | ICD-10-CM | POA: Insufficient documentation

## 2013-09-30 DIAGNOSIS — K219 Gastro-esophageal reflux disease without esophagitis: Secondary | ICD-10-CM | POA: Insufficient documentation

## 2013-09-30 DIAGNOSIS — T148XXA Other injury of unspecified body region, initial encounter: Secondary | ICD-10-CM

## 2013-09-30 DIAGNOSIS — Z79899 Other long term (current) drug therapy: Secondary | ICD-10-CM | POA: Insufficient documentation

## 2013-09-30 DIAGNOSIS — I10 Essential (primary) hypertension: Secondary | ICD-10-CM | POA: Insufficient documentation

## 2013-09-30 DIAGNOSIS — Y929 Unspecified place or not applicable: Secondary | ICD-10-CM | POA: Insufficient documentation

## 2013-09-30 DIAGNOSIS — R002 Palpitations: Secondary | ICD-10-CM | POA: Insufficient documentation

## 2013-09-30 DIAGNOSIS — S0990XA Unspecified injury of head, initial encounter: Secondary | ICD-10-CM

## 2013-09-30 DIAGNOSIS — Z8551 Personal history of malignant neoplasm of bladder: Secondary | ICD-10-CM | POA: Insufficient documentation

## 2013-09-30 DIAGNOSIS — W06XXXA Fall from bed, initial encounter: Secondary | ICD-10-CM | POA: Insufficient documentation

## 2013-09-30 DIAGNOSIS — W1809XA Striking against other object with subsequent fall, initial encounter: Secondary | ICD-10-CM | POA: Insufficient documentation

## 2013-09-30 DIAGNOSIS — Z8659 Personal history of other mental and behavioral disorders: Secondary | ICD-10-CM | POA: Insufficient documentation

## 2013-09-30 DIAGNOSIS — S0083XA Contusion of other part of head, initial encounter: Principal | ICD-10-CM | POA: Insufficient documentation

## 2013-09-30 DIAGNOSIS — Z8619 Personal history of other infectious and parasitic diseases: Secondary | ICD-10-CM | POA: Insufficient documentation

## 2013-09-30 DIAGNOSIS — S1093XA Contusion of unspecified part of neck, initial encounter: Principal | ICD-10-CM

## 2013-09-30 NOTE — ED Notes (Signed)
Daughter at side and she states her mother  had no LOC.

## 2013-09-30 NOTE — Discharge Instructions (Signed)
Tylenol or ibuprofen for pain, CT scan normal without brain injury, as return as needed to the hospital for severe symptoms including seizures, fainting spells or vomiting  Please call your doctor for a followup appointment within 24-48 hours. When you talk to your doctor please let them know that you were seen in the emergency department and have them acquire all of your records so that they can discuss the findings with you and formulate a treatment plan to fully care for your new and ongoing problems.

## 2013-09-30 NOTE — ED Provider Notes (Signed)
CSN: [id number]     Arrival date & time 09/30/13  0358 History   First MD Initiated Contact with Patient 09/30/13 [phone]     Chief Complaint  Patient presents with  . Fall    fell off couch and hit head on coffee table. noted hematoma above right eye  . Headache     (Consider location/radiation/quality/duration/timing/severity/associated sxs/prior Treatment) HPI Comments: The patient is 80 years old, she rolled out of bed where she was sleeping on the couch and struck her head on a solid wood coffee table. There was no loss of consciousness, no nausea vomiting or seizures and no focal neurologic deficits. She is complaining of pain to the right temple, no associated laceration. This occurred just prior to arrival. This was not witnessed by a family member however she did hear the sound and was at the scene immediately noting that her mother was at her baseline.  Patient is a 80 y.o. female presenting with fall and headaches. The history is provided by the patient and a relative.  Fall Associated symptoms include headaches.  Headache   Past Medical History  Diagnosis Date  . Heart palpitations     PAC's   . Hypertension   . Anxiety   . GERD (gastroesophageal reflux disease)   . Helicobacter pylori (H. pylori)     treated in 2009  . Bladder cancer    Past Surgical History  Procedure Laterality Date  . Cholecystectomy  1998  . Bladder surgey  2009  . Cataract extraction  2 years ago    both eyes  . Esophagogastroduodenoscopy  10/19/2011    Procedure: ESOPHAGOGASTRODUODENOSCOPY (EGD);  Surgeon: Rogene Houston, MD;  Location: [facility];  Service: Endoscopy;  Laterality: N/A;  730   Family History  Problem Relation Age of Onset  . Hypertension Mother   . Diabetes Mother   . Pneumonia Mother   . Diabetes Father   . Stroke Sister   . Stroke Brother   . Hypertension Daughter   . Obesity Daughter   . Diabetes Daughter    History  Substance Use Topics  . Smoking status:  Never Smoker   . Smokeless tobacco: Never Used  . Alcohol Use: No   OB History   Grav Para Term Preterm Abortions TAB SAB Ect Mult Living                 Review of Systems  Neurological: Positive for headaches.  All other systems reviewed and are negative.     Allergies  Codeine and Sulfonamide derivatives  Home Medications   Prior to Admission medications   Medication Sig Start Date End Date Taking? Authorizing Provider  Alum Hydroxide-Mag Carbonate (GAVISCON PO) Take 10 mLs by mouth daily as needed (acid reflux). Per the patient's daughter she takes 1 by mouth as needed.    Historical Provider, MD  amLODipine-benazepril (LOTREL) 5-40 MG per capsule Take 1 capsule by mouth daily. 07/01/13   Fayrene Helper, MD  aspirin EC 81 MG tablet Take 81 mg by mouth daily.    Historical Provider, MD  butalbital-acetaminophen-caffeine (FIORICET, ESGIC) [phone] MG per tablet One tablet once daily, as needed, for severe headache  Maximum of 10 tablets to last 30 days 07/01/13   Fayrene Helper, MD  calcium carbonate (TUMS - DOSED IN MG ELEMENTAL CALCIUM) 500 MG chewable tablet Chew 2 tablets by mouth as needed. For heartburn    Historical Provider, MD  docusate sodium (COLACE) 100 MG capsule  Take 100 mg by mouth as needed for mild constipation.    Historical Provider, MD  megestrol (MEGACE) 40 MG/ML suspension Take 40 mg by mouth daily.    Historical Provider, MD  Multiple Vitamin (MULITIVITAMIN WITH MINERALS) TABS Take 1 tablet by mouth daily.    Historical Provider, MD  pantoprazole (PROTONIX) 20 MG tablet Take 2 tablets (40 mg total) by mouth 2 (two) times daily. 05/18/13   Rogene Houston, MD  polyvinyl alcohol (ARTIFICIAL TEARS) 1.4 % ophthalmic solution Place 1 drop into both eyes as needed. For dry eyes    Historical Provider, MD  pravastatin (PRAVACHOL) 40 MG tablet Take 1 tablet (40 mg total) by mouth every evening. 01/30/13   Fayrene Helper, MD  rivastigmine (EXELON) 9.5  mg/24hr Place 1 patch (9.5 mg total) onto the skin daily. 04/06/13   Fayrene Helper, MD   BP 137/60  Pulse 64  Temp(Src) 98 F (36.7 C) (Oral)  Resp 18  Ht 5\' 1"  (1.549 m)  Wt 123 lb (55.792 kg)  BMI 23.25 kg/m2  SpO2 99% Physical Exam  Nursing note and vitals reviewed. Constitutional: She appears well-developed and well-nourished. No distress.  HENT:  Head: Normocephalic.  Mouth/Throat: Oropharynx is clear and moist. No oropharyngeal exudate.  Small hematoma to the temporal scalp on the right, no malocclusion, no hemotympanum  Eyes: Conjunctivae and EOM are normal. Pupils are equal, round, and reactive to light. Right eye exhibits no discharge. Left eye exhibits no discharge. No scleral icterus.  Neck: Normal range of motion. Neck supple. No JVD present. No thyromegaly present.  Cardiovascular: Normal rate, regular rhythm, normal heart sounds and intact distal pulses.  Exam reveals no gallop and no friction rub.   No murmur heard. Pulmonary/Chest: Effort normal and breath sounds normal. No respiratory distress. She has no wheezes. She has no rales.  Abdominal: Soft. Bowel sounds are normal. She exhibits no distension and no mass. There is no tenderness.  Musculoskeletal: Normal range of motion. She exhibits no edema and no tenderness.  Lymphadenopathy:    She has no cervical adenopathy.  Neurological: She is alert. Coordination normal.  Speech is clear, cranial nerves III through XII are intact, memory is intact, strength is normal in all 4 extremities including grips, sensation is intact to light touch and pinprick in all 4 extremities. Coordination as tested by finger-nose-finger is normal, no limb ataxia.   Skin: Skin is warm and dry. No rash noted. No erythema.  Psychiatric: She has a normal mood and affect. Her behavior is normal.    ED Course  Procedures (including critical care time) Labs Review Labs Reviewed - No data to display  Imaging Review Ct Head Wo  Contrast  09/30/2013   CLINICAL DATA:  Status post fall. Hit head on coffee table. Scalp hematoma above the right orbit. Headache.  EXAM: CT HEAD WITHOUT CONTRAST  TECHNIQUE: Contiguous axial images were obtained from the base of the skull through the vertex without intravenous contrast.  COMPARISON:  CT of the head performed 10/14/2011, and MRI of the brain performed 12/28/2011  FINDINGS: There is no evidence of acute infarction, mass lesion, or intra- or extra-axial hemorrhage on CT.  Prominence of the ventricles and sulci suggests mild cortical volume loss.  The posterior fossa, including the cerebellum, brainstem and fourth ventricle, is within normal limits. The third and lateral ventricles, and basal ganglia are unremarkable in appearance. The cerebral hemispheres are symmetric in appearance, with normal gray-white differentiation. No mass effect or  midline shift is seen.  There is no evidence of fracture; visualized osseous structures are unremarkable in appearance. The visualized portions of the orbits are within normal limits. The paranasal sinuses and mastoid air cells are well-aerated. Mild soft tissue swelling is noted overlying the right frontal calvarium.  IMPRESSION: 1. No evidence of traumatic intracranial injury or fracture. 2. Mild cortical volume loss noted. 3. Mild soft tissue swelling overlying the right frontal calvarium.   Electronically Signed   By: Garald Balding M.D.   On: 09/30/2013 05:03     EKG Interpretation None      MDM   Final diagnoses:  Head injury  Contusion    The patient has a rather benign presentation however she is elderly and suffered a head injury with hematoma over the right temporal area. She is nauseated but has not, CT scan of the head pending to rule out intracranial injury.  CT scan negative for fracture or bleeding, stable for discharge  Johnna Acosta, MD 09/30/13 [phone]

## 2013-10-01 ENCOUNTER — Encounter: Payer: Self-pay | Admitting: Family Medicine

## 2013-10-08 ENCOUNTER — Encounter: Payer: Self-pay | Admitting: Family Medicine

## 2013-10-08 ENCOUNTER — Ambulatory Visit (INDEPENDENT_AMBULATORY_CARE_PROVIDER_SITE_OTHER): Payer: Medicare PPO | Admitting: Family Medicine

## 2013-10-08 VITALS — BP 154/68 | HR 76 | Resp 16 | Wt 120.1 lb

## 2013-10-08 DIAGNOSIS — I10 Essential (primary) hypertension: Secondary | ICD-10-CM

## 2013-10-08 DIAGNOSIS — J309 Allergic rhinitis, unspecified: Secondary | ICD-10-CM

## 2013-10-08 DIAGNOSIS — K219 Gastro-esophageal reflux disease without esophagitis: Secondary | ICD-10-CM

## 2013-10-08 DIAGNOSIS — F0280 Dementia in other diseases classified elsewhere without behavioral disturbance: Secondary | ICD-10-CM

## 2013-10-08 DIAGNOSIS — R413 Other amnesia: Secondary | ICD-10-CM

## 2013-10-08 DIAGNOSIS — R51 Headache: Secondary | ICD-10-CM

## 2013-10-08 DIAGNOSIS — J302 Other seasonal allergic rhinitis: Secondary | ICD-10-CM | POA: Insufficient documentation

## 2013-10-08 DIAGNOSIS — G309 Alzheimer's disease, unspecified: Principal | ICD-10-CM

## 2013-10-08 MED ORDER — LORATADINE 10 MG PO TABS: 10.0000 mg | ORAL_TABLET | Freq: Every day | ORAL | Status: DC

## 2013-10-08 MED ORDER — CLONIDINE HCL 0.1 MG PO TABS: ORAL_TABLET | ORAL | Status: DC

## 2013-10-08 NOTE — Patient Instructions (Addendum)
F/u as before  New for blood pressure is clonidine take 30 mins before sleep time  You are referred for 2nd opinion re headaches and memory loss

## 2013-10-10 NOTE — Assessment & Plan Note (Signed)
Controlled, no change in medication  

## 2013-10-10 NOTE — Assessment & Plan Note (Signed)
Recurrent headaches and dementia, patient's daughter requesting 2nd opinion on her mothers neurologic conditions, referral is made

## 2013-10-10 NOTE — Assessment & Plan Note (Signed)
Pt needs daily med for symptom control, currently uncontrolled by history, allergies are also thought to be contributing to her headaches

## 2013-10-10 NOTE — Assessment & Plan Note (Signed)
Pt continues on exelon patch , she will have neurology re evaluate her demneria and suggest any upgrade in her therapy/ Pt also needs more updated MMSE, it has been painful for her daughter to witness her  Poor performance so is currently overdue

## 2013-10-10 NOTE — Progress Notes (Signed)
   Subjective:    Patient ID: [name], female    DOB: 24-Sep-1933, 80 y.o.   MRN: [id number]  HPI Pt in for f/u chronic problems. Daughter , here with her , and she cares for her Mom Pt recently in ED after falling off a couch which she does not normally sleep on, and hitting her head, ED eval yielded nothing new. Daughter wants a 2nd neurologic opinion on her mother's headaches and dementia, recently saw local neurologist but wants "another opinion" Pt c/o sinus stuffiness, denies thick green drainage , fever or chills Also c/o intermittent leg pain when she walks but history is inconsistent as patient is unreliable historian   Review of Systems See HPI Denies recent fever or chills. Denies chest congestion, productive cough or wheezing. Denies chest pains, palpitations and leg swelling Denies abdominal pain, nausea, vomiting,diarrhea or constipation.   Denies dysuria, frequency, hesitancy or incontinence. Denies depression or  anxiety c/o insomnia. Denies skin break down or rash.        Objective:   Physical Exam  BP 154/68  Pulse 76  Resp 16  Wt 120 lb 1.9 oz (54.486 kg)  SpO2 100% Patient alert  and in no cardiopulmonary distress.  HEENT: No facial asymmetry, EOMI, no sinus tenderness,  oropharynx pink and moist.  Neck decreased ROM no adenopathy.  Chest: Clear to auscultation bilaterally.  CVS: S1, S2 no murmurs, no S3.  ABD: Soft non tender. .  Ext: No edema  MS: Adequate though reduced  ROM spine, shoulders, hips and knees.  Skin: Intact, no ulcerations or rash noted.  Psych: Good eye contact, blunted  affect. Memory loss not anxious or depressed appearing.  CNS: CN 2-12 intact, power, normal throughout.       Assessment & Plan:  HYPERTENSION Uncontrolled with c/o insomnia add clonidine at bedtime  Headache(784.0) Recurrent headaches and dementia, patient's daughter requesting 2nd opinion on her mothers neurologic conditions, referral is  made  Alzheimer's disease Pt continues on exelon patch , she will have neurology re evaluate her demneria and suggest any upgrade in her therapy/ Pt also needs more updated MMSE, it has been painful for her daughter to witness her  Poor performance so is currently overdue  Seasonal allergies Pt needs daily med for symptom control, currently uncontrolled by history, allergies are also thought to be contributing to her headaches  GERD Controlled, no change in medication

## 2013-10-10 NOTE — Assessment & Plan Note (Signed)
Uncontrolled with c/o insomnia add clonidine at bedtime

## 2013-10-16 ENCOUNTER — Encounter: Payer: Self-pay | Admitting: Neurology

## 2013-10-16 ENCOUNTER — Ambulatory Visit (INDEPENDENT_AMBULATORY_CARE_PROVIDER_SITE_OTHER): Payer: Medicare PPO | Admitting: Neurology

## 2013-10-16 VITALS — BP 116/54 | HR 62 | Ht 61.5 in | Wt 119.0 lb

## 2013-10-16 DIAGNOSIS — R4189 Other symptoms and signs involving cognitive functions and awareness: Secondary | ICD-10-CM

## 2013-10-16 DIAGNOSIS — R51 Headache: Secondary | ICD-10-CM

## 2013-10-16 DIAGNOSIS — F09 Unspecified mental disorder due to known physiological condition: Secondary | ICD-10-CM

## 2013-10-16 NOTE — Progress Notes (Signed)
[facility]    Provider:  Dr Janann Colonel Referring Provider: Fayrene Helper, MD Primary Care Physician:  [name], MD  CC:  Headaches and memory decline  HPI:  [name] is a 80 y.o. female here as a referral from Dr. Moshe Cipro for headache and cognitive decline  Headaches have been occuring for years, currently occuring 1 to 2 times a week. Recently started on claritin for the headache which has helped. Will sometimes get dry eyes, get some conjunctival injection. No difficulty with vision. No worsening of headache with change in position or leaning forward. No history of neck pain. No photo or phonophobia. No eye painPain described as a pressure/pulling type sensation. Recently had MRI of the brain which was read as normal.   Daughter notes memory decline started around 1 year ago. Feels it is predominantly a problem with short term memory. Lives with her daughter who states she is mainly independent. Has some talking in her sleep. No hallucinations. Some slowing down of her walking, has history of arthritis. Using Exelon 9.5mg  patch, started around 1 year ago. No family history of neurodegenerative processes.   Otherwise healthy, has mildly elevated BP.   Review of Systems: Out of a complete 14 system review, the patient complains of only the following symptoms, and all other reviewed systems are negative. + memory loss, feeling cold  History   Social History  . Marital Status: Widowed    Spouse Name: N/A    Number of Children: N/A  . Years of Education: N/A   Occupational History  . retired     Social History Main Topics  . Smoking status: Never Smoker   . Smokeless tobacco: Never Used  . Alcohol Use: No  . Drug Use: No  . Sexual Activity: Not on file   Other Topics Concern  . Not on file   Social History Narrative   Widowed, 3 children, 1 deceased   Right handed   High school    No caffeine          Family History  Problem  Relation Age of Onset  . Hypertension Mother   . Diabetes Mother   . Pneumonia Mother   . Diabetes Father   . Stroke Sister   . Stroke Brother   . Hypertension Daughter   . Obesity Daughter   . Diabetes Daughter     Past Medical History  Diagnosis Date  . Heart palpitations     PAC's   . Hypertension   . Anxiety   . GERD (gastroesophageal reflux disease)   . Helicobacter pylori (H. pylori)     treated in 2009  . Bladder cancer     Past Surgical History  Procedure Laterality Date  . Cholecystectomy  1998  . Bladder surgey  2009  . Cataract extraction  2 years ago    both eyes  . Esophagogastroduodenoscopy  10/19/2011    Procedure: ESOPHAGOGASTRODUODENOSCOPY (EGD);  Surgeon: Rogene Houston, MD;  Location: [facility];  Service: Endoscopy;  Laterality: N/A;  730    Current Outpatient Prescriptions  Medication Sig Dispense Refill  . Alum Hydroxide-Mag Carbonate (GAVISCON PO) Take 10 mLs by mouth daily as needed (acid reflux). Per the patient's daughter she takes 1 by mouth as needed.      Marland Kitchen amLODipine-benazepril (LOTREL) 5-40 MG per capsule Take 1 capsule by mouth daily.  30 capsule  5  . aspirin EC 81 MG tablet Take 81 mg by mouth daily.      Marland Kitchen  calcium carbonate (TUMS - DOSED IN MG ELEMENTAL CALCIUM) 500 MG chewable tablet Chew 2 tablets by mouth as needed. For heartburn      . cloNIDine (CATAPRES) 0.1 MG tablet One tablet at bedtimne  30 tablet  4  . docusate sodium (COLACE) 100 MG capsule Take 100 mg by mouth as needed for mild constipation.      Marland Kitchen loratadine (CLARITIN) 10 MG tablet Take 1 tablet (10 mg total) by mouth daily.  30 tablet  11  . Multiple Vitamin (MULITIVITAMIN WITH MINERALS) TABS Take 1 tablet by mouth daily.      . pantoprazole (PROTONIX) 20 MG tablet Take 2 tablets (40 mg total) by mouth 2 (two) times daily.  60 tablet  5  . polyvinyl alcohol (ARTIFICIAL TEARS) 1.4 % ophthalmic solution Place 1 drop into both eyes as needed. For dry eyes      .  pravastatin (PRAVACHOL) 40 MG tablet Take 1 tablet (40 mg total) by mouth every evening.  30 tablet  11  . rivastigmine (EXELON) 9.5 mg/24hr Place 1 patch (9.5 mg total) onto the skin daily.  30 patch  12   No current facility-administered medications for this visit.    Allergies as of 10/16/2013 - Review Complete 10/16/2013  Allergen Reaction Noted  . Codeine Other (See Comments) 07/25/2007  . Sulfonamide derivatives Other (See Comments) 07/25/2007    Vitals: BP 116/54  Pulse 62  Ht 5' 1.5" (1.562 m)  Wt 119 lb (53.978 kg)  BMI 22.12 kg/m2 Last Weight:  Wt Readings from Last 1 Encounters:  10/16/13 119 lb (53.978 kg)   Last Height:   Ht Readings from Last 1 Encounters:  10/16/13 5' 1.5" (1.562 m)     Physical exam: Exam: Gen: NAD, conversant Eyes: anicteric sclerae, moist conjunctivae HENT: Atraumatic, oropharynx clear, no temporal tenderness Neck: Trachea midline; supple,  Lungs: CTA, no wheezing, rales, rhonic                          CV: RRR, no MRG Abdomen: Soft, non-tender;  Extremities: No peripheral edema  Skin: Normal temperature, no rash,  Psych: Appropriate affect, pleasant  Neuro: MS: MOCA 5/30  CN: PERRL, EOMI no nystagmus, no ptosis, sensation intact to LT V1-V3 bilat, face symmetric, no weakness, hearing grossly intact, palate elevates symmetrically, shoulder shrug 5/5 bilat,  tongue protrudes midline, no fasiculations noted.  Motor: normal bulk and tone Strength: 5/5  In all extremities  Coord: rapid alternating and point-to-point (FNF, HTS) movements intact.  Reflexes: symmetrical, bilat downgoing toes  Sens: LT intact in all extremities  Gait: posture, stance, stride and arm-swing normal. Tandem gait intact. Able to walk on heels and toes. Romberg absent.   Assessment:  After physical and neurologic examination, review of laboratory studies, imaging, neurophysiology testing and pre-existing records, assessment will be reviewed on the  problem list.  Plan:  Treatment plan and additional workup will be reviewed under Problem List.  1)Cognitive decline 2)Headache  80y/o woman presenting for initial evaluation of cognitive decline and headache. Daughter notes that the patients headache improved with the recent addition of claritin. She has had a normal brain MRI. No temporal tenderness or visual changes making temporal arteritis less likely. Based on description would consider sinus headache vs tension type. Would continue claritin and tylenol as needed. Would suggest checking B12, MMA and TSH if not already checked for history of cognitive decline (they wish to have blood work with PCP). If  normal, would suggest increasing Exelon to 13.3mg  patch. Follow up as needed.    Jim Like, DO  [facility] [address] [place], [place] [address]  Phone [phone] Fax [phone]

## 2013-11-03 ENCOUNTER — Encounter: Payer: Self-pay | Admitting: Family Medicine

## 2013-11-03 ENCOUNTER — Other Ambulatory Visit (HOSPITAL_COMMUNITY)
Admission: RE | Admit: 2013-11-03 | Discharge: 2013-11-03 | Disposition: A | Discharge status: Home / Self Care | Payer: Medicare PPO | Source: Ambulatory Visit | Attending: Family Medicine | Admitting: Family Medicine

## 2013-11-03 ENCOUNTER — Ambulatory Visit (INDEPENDENT_AMBULATORY_CARE_PROVIDER_SITE_OTHER): Payer: Medicare PPO | Admitting: Family Medicine

## 2013-11-03 VITALS — BP 130/64 | HR 61 | Resp 16 | Ht 61.0 in | Wt 120.8 lb

## 2013-11-03 DIAGNOSIS — R5383 Other fatigue: Secondary | ICD-10-CM

## 2013-11-03 DIAGNOSIS — Z0000 Encounter for general adult medical examination without abnormal findings: Secondary | ICD-10-CM

## 2013-11-03 DIAGNOSIS — Z23 Encounter for immunization: Secondary | ICD-10-CM

## 2013-11-03 DIAGNOSIS — Z124 Encounter for screening for malignant neoplasm of cervix: Secondary | ICD-10-CM | POA: Insufficient documentation

## 2013-11-03 DIAGNOSIS — M949 Disorder of cartilage, unspecified: Secondary | ICD-10-CM

## 2013-11-03 DIAGNOSIS — M899 Disorder of bone, unspecified: Secondary | ICD-10-CM

## 2013-11-03 DIAGNOSIS — I10 Essential (primary) hypertension: Secondary | ICD-10-CM

## 2013-11-03 DIAGNOSIS — F0280 Dementia in other diseases classified elsewhere without behavioral disturbance: Secondary | ICD-10-CM

## 2013-11-03 DIAGNOSIS — D649 Anemia, unspecified: Secondary | ICD-10-CM

## 2013-11-03 DIAGNOSIS — Z1211 Encounter for screening for malignant neoplasm of colon: Secondary | ICD-10-CM

## 2013-11-03 DIAGNOSIS — R5381 Other malaise: Secondary | ICD-10-CM

## 2013-11-03 DIAGNOSIS — E785 Hyperlipidemia, unspecified: Secondary | ICD-10-CM

## 2013-11-03 DIAGNOSIS — G309 Alzheimer's disease, unspecified: Secondary | ICD-10-CM

## 2013-11-03 LAB — POC HEMOCCULT BLD/STL (OFFICE/1-CARD/DIAGNOSTIC): Fecal Occult Blood, POC: NEGATIVE

## 2013-11-03 MED ORDER — RIVASTIGMINE 13.3 MG/24HR TD PT24: MEDICATED_PATCH | TRANSDERMAL | Status: DC

## 2013-11-03 NOTE — Patient Instructions (Addendum)
F/u early October call if you need me before  Pneumovac 13 today    Fasting lipid, cmp ,  B12, TSH, vit D and CBC Sept 30 or after  BP is excellent now.  Dose increase in medication for memory as discussed

## 2013-11-05 LAB — CYTOLOGY - PAP

## 2013-11-12 ENCOUNTER — Other Ambulatory Visit (INDEPENDENT_AMBULATORY_CARE_PROVIDER_SITE_OTHER): Payer: Self-pay | Admitting: Internal Medicine

## 2013-11-12 NOTE — Telephone Encounter (Signed)
Dr.Rehman may refill with 11 refills.

## 2013-11-16 ENCOUNTER — Ambulatory Visit (INDEPENDENT_AMBULATORY_CARE_PROVIDER_SITE_OTHER): Payer: Medicare PPO | Admitting: Internal Medicine

## 2013-11-16 ENCOUNTER — Encounter (INDEPENDENT_AMBULATORY_CARE_PROVIDER_SITE_OTHER): Payer: Self-pay | Admitting: Internal Medicine

## 2013-11-16 VITALS — BP 130/70 | HR 78 | Temp 97.9°F | Resp 18 | Ht 61.0 in | Wt 118.3 lb

## 2013-11-16 DIAGNOSIS — R1013 Epigastric pain: Secondary | ICD-10-CM

## 2013-11-16 DIAGNOSIS — K219 Gastro-esophageal reflux disease without esophagitis: Secondary | ICD-10-CM

## 2013-11-16 MED ORDER — DOCUSATE SODIUM 100 MG PO CAPS: 200.0000 mg | ORAL_CAPSULE | Freq: Every day | ORAL | Status: DC

## 2013-11-16 NOTE — Progress Notes (Signed)
Presenting complaint;  Epigastric pain and weight loss.  Subjective:  Patient is 80 years old [name] is here for scheduled visit accompanied by her daughter [name]. She is usually accompanied by her other daughter [name] she is on medication. Patient was last seen 6 months ago. She states she does not feel as well as she did before. She has intermittent epigastric pain which she describes as burning. This pain is not associated with nausea vomiting. She says her appetite is fair. She eats ordeals all the time. She is using Gaviscon almost daily it helps. Can use a component of constipation. She is taking Colace on an as-needed basis. She is using MOM at least once a week. She takes first dorsal pantoprazole before breakfast but she take second dose about 2-3 hours after evening meal which is down 5 PM. She denies melena or rectal bleeding fever or chills.   Current Medications: Outpatient Encounter Prescriptions as of 11/16/2013  Medication Sig  . Alum Hydroxide-Mag Carbonate (GAVISCON PO) Take 10 mLs by mouth daily as needed (acid reflux). Per the patient's daughter she takes 1 by mouth as needed.  Marland Kitchen amLODipine-benazepril (LOTREL) 5-40 MG per capsule Take 1 capsule by mouth daily.  Marland Kitchen aspirin EC 81 MG tablet Take 81 mg by mouth daily.  . calcium carbonate (TUMS - DOSED IN MG ELEMENTAL CALCIUM) 500 MG chewable tablet Chew 2 tablets by mouth as needed. For heartburn  . cloNIDine (CATAPRES) 0.1 MG tablet One tablet at bedtimne  . docusate sodium (COLACE) 100 MG capsule Take 100 mg by mouth as needed for mild constipation.  Marland Kitchen loratadine (CLARITIN) 10 MG tablet Take 1 tablet (10 mg total) by mouth daily.  . Multiple Vitamin (MULITIVITAMIN WITH MINERALS) TABS Take 1 tablet by mouth daily.  . pantoprazole (PROTONIX) 40 MG tablet TAKE (1) TABLET TWICE DAILY.  Marland Kitchen polyvinyl alcohol (ARTIFICIAL TEARS) 1.4 % ophthalmic solution Place 1 drop into both eyes as needed. For dry eyes  . pravastatin  (PRAVACHOL) 40 MG tablet Take 1 tablet (40 mg total) by mouth every evening.  . Rivastigmine (EXELON) 13.3 MG/24HR PT24 Apply one patch to skin daily, remove after 24 hours, and replace  . [DISCONTINUED] pantoprazole (PROTONIX) 20 MG tablet Take 2 tablets (40 mg total) by mouth 2 (two) times daily.    Objective: Blood pressure 130/70, pulse 78, temperature 97.9 F (36.6 C), temperature source Oral, resp. rate 18, height 5\' 1"  (1.549 m), weight 118 lb 4.8 oz (53.661 kg). Patient is alert and in no acute distress. Conjunctiva is pink. Sclera is nonicteric Oropharyngeal mucosa is normal. No neck masses or thyromegaly noted. Cardiac exam with regular rhythm normal S1 and S2. No murmur or gallop noted. Lungs are clear to auscultation. Abdomen is flat. Bowel sounds are normal. Abdomen is soft with mild epigastric tenderness which is high in the epigastric region. No organomegaly or masses.  No LE edema or clubbing noted.     Assessment:  #1. Epigastric pain. This is a chronic symptom and she has been poorly evaluated. Suspect she has chronic dyspepsia and she does not have any alarm symptoms. She is not taking second dose of PPI after meal should be taking it before meal. #2. Weight loss. Her weight is down by 6 pounds in the last 6 months still she is above her baseline 104 pounds in May 2013 #3. Constipation. Once again patient is taking Colace on an as-needed basis and is using and mom frequently.    Plan:  Patient  advised to take pantoprazole before breakfast and evening meal. She should take Colace 200 mg by mouth each bedtime Neurontin when necessary. He can continue to use Gaviscon on a when necessary basis her daughter keep record of frequency of its use. Office visit in 6 months.

## 2013-11-16 NOTE — Patient Instructions (Signed)
Pantoprazole 40 mg by mouth 30 minutes before breakfast and evening meal daily. Take Colace 200 mg or 2 capsules by mouth daily at bedtime. Keep  record of Gaviscon use.

## 2013-12-14 ENCOUNTER — Encounter (INDEPENDENT_AMBULATORY_CARE_PROVIDER_SITE_OTHER): Payer: Self-pay | Admitting: Internal Medicine

## 2013-12-14 ENCOUNTER — Ambulatory Visit (INDEPENDENT_AMBULATORY_CARE_PROVIDER_SITE_OTHER): Payer: Medicare PPO | Admitting: Internal Medicine

## 2013-12-14 VITALS — BP 130/80 | HR 76 | Temp 97.7°F | Resp 18 | Ht 61.0 in | Wt 118.9 lb

## 2013-12-14 DIAGNOSIS — R634 Abnormal weight loss: Secondary | ICD-10-CM

## 2013-12-14 DIAGNOSIS — K5901 Slow transit constipation: Secondary | ICD-10-CM

## 2013-12-14 DIAGNOSIS — R1013 Epigastric pain: Secondary | ICD-10-CM

## 2013-12-14 MED ORDER — TRAMADOL HCL 50 MG PO TABS: 50.0000 mg | ORAL_TABLET | Freq: Every day | ORAL | Status: DC | PRN

## 2013-12-14 MED ORDER — PANTOPRAZOLE SODIUM 40 MG PO TBEC: 40.0000 mg | DELAYED_RELEASE_TABLET | Freq: Every day | ORAL | Status: DC

## 2013-12-14 MED ORDER — CALCIUM CARBONATE-VITAMIN D 500-200 MG-UNIT PO TABS: 1.0000 | ORAL_TABLET | Freq: Two times a day (BID) | ORAL | Status: DC

## 2013-12-14 NOTE — Progress Notes (Signed)
Presenting complaint;  Epigastric pain and weight loss.  Subjective:  Patient is a 80 year old African American female who is here for reevaluation of epigastric pain and she's accompanied by her daughter [name]. She was seen 4 weeks ago and advised to take second dose of pantoprazole for evening meal and not after. She was also advised to keep a written record and she uses Gaviscon and advised to take Colace daily. She states her pain has gotten a lot worse. It is always located in the midepigastric region in the midline. His mild but absent in the morning and worse in the evening. She is using Gaviscon fairly regularly but not daily. She's not even sure if it helps or not. Her appetite is fair. According to her daughter she is on essentially bland diet. She loves to eat oatmeal. She is still having problems with constipation but denies melena or rectal bleeding. She also denies nausea or vomiting. She tries to walk some daily and she goes to wire for exercises twice a week. She has not lost any more weight since her last visit but she is afraid that she might lose more weight and get down to less than 110 pounds when she was over 2 years ago. She does not take OTC NSAIDs. She does not take calcium or vitamin D; she has history of osteoporosis.   Current Medications: Outpatient Encounter Prescriptions as of 12/14/2013  Medication Sig  . Alum Hydroxide-Mag Carbonate (GAVISCON PO) Take 10 mLs by mouth daily as needed (acid reflux). Per the patient's daughter she takes 1 by mouth as needed.  Marland Kitchen amLODipine-benazepril (LOTREL) 5-40 MG per capsule Take 1 capsule by mouth daily.  Marland Kitchen aspirin EC 81 MG tablet Take 81 mg by mouth daily.  . calcium carbonate (TUMS - DOSED IN MG ELEMENTAL CALCIUM) 500 MG chewable tablet Chew 2 tablets by mouth as needed. For heartburn  . cloNIDine (CATAPRES) 0.1 MG tablet One tablet at bedtimne  . docusate sodium (COLACE) 100 MG capsule Take 2 capsules (200 mg total) by mouth  daily.  Marland Kitchen loratadine (CLARITIN) 10 MG tablet Take 1 tablet (10 mg total) by mouth daily.  . Multiple Vitamin (MULITIVITAMIN WITH MINERALS) TABS Take 1 tablet by mouth daily.  . pantoprazole (PROTONIX) 40 MG tablet TAKE (1) TABLET TWICE DAILY.  Marland Kitchen polyvinyl alcohol (ARTIFICIAL TEARS) 1.4 % ophthalmic solution Place 1 drop into both eyes as needed. For dry eyes  . pravastatin (PRAVACHOL) 40 MG tablet Take 1 tablet (40 mg total) by mouth every evening.  . Rivastigmine (EXELON) 13.3 MG/24HR PT24 Apply one patch to skin daily, remove after 24 hours, and replace    Objective: Blood pressure 130/80, pulse 76, temperature 97.7 F (36.5 C), temperature source Oral, resp. rate 18, height 5\' 1"  (1.549 m), weight 118 lb 14.4 oz (53.933 kg). Patient is alert and in no acute distress Conjunctiva is pink. Sclera is nonicteric Oropharyngeal mucosa is normal. No neck masses or thyromegaly noted. Cardiac exam with regular rhythm normal S1 and S2. No murmur or gallop noted. Lungs are clear to auscultation. Abdomen symmetrical. Sounds are normal and no bruits noted. Abdomen is soft with mild mid epigastric tenderness but without guarding or rebound. No organomegaly or masses.  No LE edema or clubbing noted.  Labs/studies Results: Hemoccults by Dr. Moshe Cipro on 11/03/2013 was negative. Last abdominal pelvic CT was on 07/23/2011 revealing 37 mm right adnexal cystic lesion stable since 2012 mild distal esophageal wall thickening, views calcification 2 abdominal aorta and its branches  and bilateral renal cysts.  Assessment:  #1. Acute on chronic epigastric pain has gotten worse lately and seemed to be more pronounced after evening meal, she had lost 6 pounds prior to her last visit 4 weeks ago but none since then. EGD in June 2013 was negative for peptic ulcer disease or eosinophilic gastritis. H. pylori stains are negative. It did show mild inflammation and intestinal metaplasia. She has not responded to  sucralfate in the past and on not sure that PPI has helped other than controlling her heartburn. I wonder if pain is secondary to PPI. Pain could be secondary to symptoms are not typical of abdominal angina. #2. History of osteoporosis.    Plan:  Decrease pantoprazole to once a day and take it 30 minutes before breakfast. Os-Cal 500/200 one twice a day by mouth.  Patient will talk with Dr. Moshe Cipro if she needs to be on other agent for osteoporosis. Patient advised to take Colace daily. Tramadol 50 mg by mouth daily before evening meal for one week and thereafter on as-needed basis. Patient's daughter [name] will call if she has any side effects. CBC with differential, serum lipase and comprehensive chemistry panel. Abdominopelvic CT with contrast if renal function normal or near-normal. Office visit in 3 months.

## 2013-12-14 NOTE — Patient Instructions (Signed)
Physician will call with results of blood tests and CT scan when completed. 

## 2013-12-15 ENCOUNTER — Encounter (INDEPENDENT_AMBULATORY_CARE_PROVIDER_SITE_OTHER): Payer: Self-pay | Admitting: *Deleted

## 2013-12-15 LAB — COMPREHENSIVE METABOLIC PANEL
ALBUMIN: 3.8 g/dL (ref 3.5–5.2)
ALT: 15 U/L (ref 0–35)
AST: 22 U/L (ref 0–37)
Alkaline Phosphatase: 54 U/L (ref 39–117)
BUN: 18 mg/dL (ref 6–23)
CO2: 30 meq/L (ref 19–32)
CREATININE: 1.1 mg/dL (ref 0.50–1.10)
Calcium: 9.6 mg/dL (ref 8.4–10.5)
Chloride: 109 mEq/L (ref 96–112)
GLUCOSE: 103 mg/dL — AB (ref 70–99)
Potassium: 5 mEq/L (ref 3.5–5.3)
Sodium: 146 mEq/L — ABNORMAL HIGH (ref 135–145)
Total Bilirubin: 0.7 mg/dL (ref 0.2–1.2)
Total Protein: 6.5 g/dL (ref 6.0–8.3)

## 2013-12-15 LAB — CBC WITH DIFFERENTIAL/PLATELET
Basophils Absolute: 0 10*3/uL (ref 0.0–0.1)
Basophils Relative: 0 % (ref 0–1)
EOS ABS: 0.1 10*3/uL (ref 0.0–0.7)
Eosinophils Relative: 3 % (ref 0–5)
HCT: 37.9 % (ref 36.0–46.0)
HEMOGLOBIN: 12.5 g/dL (ref 12.0–15.0)
Lymphocytes Relative: 27 % (ref 12–46)
Lymphs Abs: 1.3 10*3/uL (ref 0.7–4.0)
MCH: 31.5 pg (ref 26.0–34.0)
MCHC: 33 g/dL (ref 30.0–36.0)
MCV: 95.5 fL (ref 78.0–100.0)
MONO ABS: 0.5 10*3/uL (ref 0.1–1.0)
MONOS PCT: 10 % (ref 3–12)
Neutro Abs: 2.9 10*3/uL (ref 1.7–7.7)
Neutrophils Relative %: 60 % (ref 43–77)
PLATELETS: 217 10*3/uL (ref 150–400)
RBC: 3.97 MIL/uL (ref 3.87–5.11)
RDW: 14.1 % (ref 11.5–15.5)
WBC: 4.9 10*3/uL (ref 4.0–10.5)

## 2013-12-15 LAB — LIPASE: Lipase: 29 U/L (ref 0–75)

## 2013-12-18 ENCOUNTER — Ambulatory Visit (HOSPITAL_COMMUNITY): Payer: Medicare PPO

## 2013-12-21 ENCOUNTER — Ambulatory Visit (HOSPITAL_COMMUNITY)
Admission: RE | Admit: 2013-12-21 | Discharge: 2013-12-21 | Disposition: A | Discharge status: Home / Self Care | Payer: Medicare PPO | Source: Ambulatory Visit | Attending: Internal Medicine | Admitting: Internal Medicine

## 2013-12-21 DIAGNOSIS — R634 Abnormal weight loss: Secondary | ICD-10-CM | POA: Diagnosis not present

## 2013-12-21 DIAGNOSIS — R1013 Epigastric pain: Secondary | ICD-10-CM | POA: Diagnosis present

## 2013-12-21 MED ORDER — IOHEXOL 300 MG/ML  SOLN
100.0000 mL | Freq: Once | INTRAMUSCULAR | Status: AC | PRN
  Administered 2013-12-21: 100 mL via INTRAVENOUS

## 2013-12-24 ENCOUNTER — Other Ambulatory Visit: Payer: Self-pay | Admitting: Family Medicine

## 2014-01-23 ENCOUNTER — Encounter: Payer: Self-pay | Admitting: Family Medicine

## 2014-01-29 ENCOUNTER — Other Ambulatory Visit: Payer: Self-pay | Admitting: Family Medicine

## 2014-02-10 ENCOUNTER — Ambulatory Visit (INDEPENDENT_AMBULATORY_CARE_PROVIDER_SITE_OTHER): Payer: Medicare PPO

## 2014-02-10 DIAGNOSIS — Z23 Encounter for immunization: Secondary | ICD-10-CM

## 2014-03-01 ENCOUNTER — Other Ambulatory Visit: Payer: Self-pay

## 2014-03-01 DIAGNOSIS — E785 Hyperlipidemia, unspecified: Secondary | ICD-10-CM

## 2014-03-01 DIAGNOSIS — Z0000 Encounter for general adult medical examination without abnormal findings: Secondary | ICD-10-CM | POA: Insufficient documentation

## 2014-03-01 DIAGNOSIS — I10 Essential (primary) hypertension: Secondary | ICD-10-CM

## 2014-03-01 NOTE — Progress Notes (Signed)
   Subjective:    Patient ID: [name], female    DOB: Sep 14, 1933, 80 y.o.   MRN: [id number]  HPI Patient is in for annual physical exam. Dose of medication for memory loss is increased, otherwise no medication changes are made  Review of Systems See HPI     Objective:   Physical Exam BP 130/64  Pulse 61  Resp 16  Ht 5\' 1"  (1.549 m)  Wt 120 lb 12.8 oz (54.795 kg)  BMI 22.84 kg/m2  SpO2 98%  Pleasant well nourished female, alert , in no cardio-pulmonary distress. Afebrile. HEENT No facial trauma or asymetry. Sinuses non tender.  Extra occullar muscles intact, pupils equally reactive to light. External ears normal, tympanic membranes clear. Oropharynx moist, no exudate, good dentition. Neck: supple, no adenopathy,JVD or thyromegaly.No bruits.  Chest: Clear to ascultation bilaterally.No crackles or wheezes. Non tender to palpation  Breast: No asymetry,no masses or lumps. No tenderness. No nipple discharge or inversion. No axillary or supraclavicular adenopathy  Cardiovascular system; Heart sounds normal,  S1 and  S2 ,no S3.  No murmur, or thrill. Apical beat not displaced Peripheral pulses normal.  Abdomen: Soft, non tender, no organomegaly or masses. No bruits. Bowel sounds normal. No guarding, tenderness or rebound.  Rectal:  Normal sphincter tone. No mass.No rectal masses.  Guaiac negative stool.  GU: External genitalia normal female genitalia , female distribution of hair. No lesions. Urethral meatus normal in size, no  Prolapse, no lesions visibly  Present. Bladder non tender. Vagina pink and moist , with no visible lesions , discharge present . Adequate pelvic support no  cystocele or rectocele noted Cervix pink and appears healthy, no lesions or ulcerations noted, no discharge noted from os Uterus normal size, no adnexal masses, no cervical motion or adnexal tenderness.   Musculoskeletal exam: Full ROM of spine, hips , shoulders and  knees. No deformity ,swelling or crepitus noted. No muscle wasting or atrophy.   Neurologic: Cranial nerves 2 to 12 intact. Power, tone ,sensation and reflexes normal throughout. No disturbance in gait. No tremor.  Skin: Intact, no ulceration, erythema , scaling or rash noted. Pigmentation normal throughout  Psych; Normal mood and affect. Judgement and concentration normal        Assessment & Plan:

## 2014-03-01 NOTE — Assessment & Plan Note (Signed)

## 2014-03-01 NOTE — Assessment & Plan Note (Signed)
Dose increase in exolon patch to treating dose made at this visit

## 2014-03-16 ENCOUNTER — Other Ambulatory Visit: Payer: Self-pay | Admitting: Family Medicine

## 2014-03-18 ENCOUNTER — Encounter: Payer: Self-pay | Admitting: Family Medicine

## 2014-03-19 ENCOUNTER — Other Ambulatory Visit: Payer: Self-pay | Admitting: Family Medicine

## 2014-03-19 DIAGNOSIS — Z0110 Encounter for examination of ears and hearing without abnormal findings: Secondary | ICD-10-CM

## 2014-03-19 DIAGNOSIS — H938X9 Other specified disorders of ear, unspecified ear: Secondary | ICD-10-CM

## 2014-03-23 ENCOUNTER — Encounter: Payer: Self-pay | Admitting: Family Medicine

## 2014-03-23 ENCOUNTER — Encounter (INDEPENDENT_AMBULATORY_CARE_PROVIDER_SITE_OTHER): Payer: Self-pay | Admitting: Internal Medicine

## 2014-03-23 ENCOUNTER — Ambulatory Visit (INDEPENDENT_AMBULATORY_CARE_PROVIDER_SITE_OTHER): Payer: Medicare PPO | Admitting: Internal Medicine

## 2014-03-23 ENCOUNTER — Other Ambulatory Visit: Payer: Self-pay | Admitting: Family Medicine

## 2014-03-23 ENCOUNTER — Other Ambulatory Visit: Payer: Self-pay

## 2014-03-23 VITALS — BP 130/74 | HR 80 | Temp 98.5°F | Resp 18 | Ht 61.0 in | Wt 121.1 lb

## 2014-03-23 DIAGNOSIS — R634 Abnormal weight loss: Secondary | ICD-10-CM

## 2014-03-23 DIAGNOSIS — R1013 Epigastric pain: Secondary | ICD-10-CM

## 2014-03-23 MED ORDER — AMLODIPINE BESY-BENAZEPRIL HCL 5-40 MG PO CAPS: ORAL_CAPSULE | ORAL | Status: DC

## 2014-03-23 NOTE — Progress Notes (Signed)
Presenting complaint;  Follow-up for epigastric pain and weight loss.  Subjective:  Patient is 80 year old [place] American female who is here for scheduled visit. She was last seen on 12/14/2013. She is accompanied by her daughter Santiago Glad today. Patient has no complaints. She rarely experiences epigastric pain. Her appetite is good. Bowels are moving daily. She does not feel she has constipation. She denies melena or rectal bleeding. She is trying to walk couple of times a week. She does not have any problems with imbalance. Her daughter states her memory disorder has not worsened any since she has has been on Exelon. She rarely has to take Gaviscon. She has gained 3 pounds since her last visit.   Current Medications: Outpatient Encounter Prescriptions as of 03/23/2014  Medication Sig  . Alum Hydroxide-Mag Carbonate (GAVISCON PO) Take 10 mLs by mouth daily as needed (acid reflux). Per the patient's daughter she takes 1 by mouth as needed.  Marland Kitchen amLODipine-benazepril (LOTREL) 5-40 MG per capsule TAKE 1 CAPSULE BY MOUTH ONCE A DAY.  . calcium-vitamin D (OSCAL 500/200 D-3) 500-200 MG-UNIT per tablet Take 1 tablet by mouth 2 (two) times daily.  . cloNIDine (CATAPRES) 0.1 MG tablet TAKE ONE TABLET BY MOUTH AT BEDTIME.  Marland Kitchen loratadine (CLARITIN) 10 MG tablet Take 1 tablet (10 mg total) by mouth daily.  . Multiple Vitamin (MULITIVITAMIN WITH MINERALS) TABS Take 1 tablet by mouth daily.  . pantoprazole (PROTONIX) 40 MG tablet Take 1 tablet (40 mg total) by mouth daily before breakfast.  . polyvinyl alcohol (ARTIFICIAL TEARS) 1.4 % ophthalmic solution Place 1 drop into both eyes as needed. For dry eyes  . pravastatin (PRAVACHOL) 40 MG tablet TAKE 1 TABLET IN THE EVENING.  . Rivastigmine (EXELON) 13.3 MG/24HR PT24 Apply one patch to skin daily, remove after 24 hours, and replace  . traMADol (ULTRAM) 50 MG tablet Take 1 tablet (50 mg total) by mouth daily as needed.  . [DISCONTINUED] aspirin EC 81 MG tablet  Take 81 mg by mouth daily.  . [DISCONTINUED] docusate sodium (COLACE) 100 MG capsule Take 2 capsules (200 mg total) by mouth daily.     Objective: Blood pressure 130/74, pulse 80, temperature 98.5 F (36.9 C), temperature source Oral, resp. rate 18, height 5\' 1"  (1.549 m), weight 121 lb 1.6 oz (54.931 kg). Patient is alert and in no acute distress. Conjunctiva is pink. Sclera is nonicteric Oropharyngeal mucosa is normal. No neck masses or thyromegaly noted. Cardiac exam with regular rhythm normal S1 and S2. No murmur or gallop noted. Lungs are clear to auscultation. Abdomen is symmetrical soft and nontender without organomegaly or masses.  No LE edema or clubbing noted.   Assessment:  #1. Epigastric pain appears to be due to chronic dyspepsia. She has undergone fairly extensive workup within the last two years. She is presently asymptomatic. #2. History of weight loss. She has gained 3 pounds since her last visit. At one point she was 105 pounds and was on Megace.   Plan:  Continue pantoprazole at 40 mg by mouth every morning. Continue Gaviscon use on as-needed basis. Office visit in 6 months.

## 2014-03-23 NOTE — Patient Instructions (Signed)
Notify if abdominal pain recurs.

## 2014-05-04 ENCOUNTER — Other Ambulatory Visit: Payer: Self-pay | Admitting: Family Medicine

## 2014-05-04 ENCOUNTER — Encounter: Payer: Self-pay | Admitting: Family Medicine

## 2014-05-04 DIAGNOSIS — Z8509 Personal history of malignant neoplasm of other digestive organs: Secondary | ICD-10-CM

## 2014-05-19 ENCOUNTER — Encounter: Payer: Self-pay | Admitting: Family Medicine

## 2014-05-19 ENCOUNTER — Ambulatory Visit (INDEPENDENT_AMBULATORY_CARE_PROVIDER_SITE_OTHER): Payer: Medicare PPO | Admitting: Family Medicine

## 2014-05-19 VITALS — BP 118/60 | HR 67 | Resp 16 | Ht 61.0 in | Wt 120.1 lb

## 2014-05-19 DIAGNOSIS — J302 Other seasonal allergic rhinitis: Secondary | ICD-10-CM

## 2014-05-19 DIAGNOSIS — E875 Hyperkalemia: Secondary | ICD-10-CM

## 2014-05-19 DIAGNOSIS — I10 Essential (primary) hypertension: Secondary | ICD-10-CM

## 2014-05-19 DIAGNOSIS — G309 Alzheimer's disease, unspecified: Secondary | ICD-10-CM

## 2014-05-19 DIAGNOSIS — E785 Hyperlipidemia, unspecified: Secondary | ICD-10-CM

## 2014-05-19 DIAGNOSIS — K219 Gastro-esophageal reflux disease without esophagitis: Secondary | ICD-10-CM

## 2014-05-19 DIAGNOSIS — Z139 Encounter for screening, unspecified: Secondary | ICD-10-CM

## 2014-05-19 DIAGNOSIS — F0280 Dementia in other diseases classified elsewhere without behavioral disturbance: Secondary | ICD-10-CM

## 2014-05-19 MED ORDER — RIVASTIGMINE 13.3 MG/24HR TD PT24: MEDICATED_PATCH | TRANSDERMAL | Status: DC

## 2014-05-19 NOTE — Progress Notes (Signed)
   Subjective:    Patient ID: [name], female    DOB: 08/05/1933, 81 y.o.   MRN: [id number]  HPI The PT is here for follow up and re-evaluation of chronic medical conditions, medication management and review of any available recent lab and radiology data.  Preventive health is updated, specifically  Cancer screening and Immunization.   Questions or concerns regarding consultations or procedures which the PT has had in the interim are  Addressed.will be getting hearing aids in the near future The PT denies any adverse reactions to current medications since the last visit.  There are no new concerns.  There are no specific complaints       Review of Systems See HPI Denies recent fever or chills. Denies sinus pressure, nasal congestion, ear pain or sore throat. Denies chest congestion, productive cough or wheezing. Denies chest pains, palpitations and leg swelling Denies abdominal pain, nausea, vomiting,diarrhea or constipation.   Denies dysuria, frequency, hesitancy or incontinence. Denies joint pain, swelling and limitation in mobility. Denies headaches, seizures, numbness, or tingling. Denies depression, anxiety or insomnia. Denies skin break down or rash.        Objective:   Physical Exam BP 118/60 mmHg  Pulse 67  Resp 16  Ht 5\' 1"  (1.549 m)  Wt 120 lb 1.9 oz (54.486 kg)  BMI 22.71 kg/m2  SpO2 98% Patient alert  and in no cardiopulmonary distress.History verified by her daughter, pt incapable of reliable history  HEENT: No facial asymmetry, EOMI,   oropharynx pink and moist.  Neck supple no JVD, no mass.  Chest: Clear to auscultation bilaterally.  CVS: S1, S2 no murmurs, no S3.Regular rate.  ABD: Soft non tender.   Ext: No edema  MS: Adequate ROM spine, shoulders, hips and knees.  Skin: Intact, no ulcerations or rash noted.  Psych: Good eye contact, normal affect. Memory loss, severe not anxious or depressed appearing.  CNS: CN 2-12 intact, power,   normal throughout.no focal deficits noted.        Assessment & Plan:  Alzheimer's disease Continue exelon patch at current treating dose, no behavioral issues   Essential hypertension Controlled, no change in medication    Hyperlipidemia Controlled, no change in medication Hyperlipidemia:Low fat diet discussed and encouraged.     Seasonal allergies No current flare , continue as needed claritin   GERD Controlled, no change in medication

## 2014-05-19 NOTE — Patient Instructions (Signed)
Annual wellness in 6 month, call me before   Excellent exam, no changes in meds  Fasting lipid, cmp , tSH and vit D  this week

## 2014-05-20 LAB — LIPID PANEL
Cholesterol: 164 mg/dL (ref 0–200)
HDL: 56 mg/dL (ref >39)
LDL Cholesterol: 91 mg/dL (ref 0–99)
TRIGLYCERIDES: 84 mg/dL (ref <150)
Total CHOL/HDL Ratio: 2.9 Ratio
VLDL: 17 mg/dL (ref 0–40)

## 2014-05-20 LAB — COMPREHENSIVE METABOLIC PANEL
ALBUMIN: 4 g/dL (ref 3.5–5.2)
ALT: 10 U/L (ref 0–35)
AST: 23 U/L (ref 0–37)
Alkaline Phosphatase: 62 U/L (ref 39–117)
BILIRUBIN TOTAL: 0.7 mg/dL (ref 0.2–1.2)
BUN: 16 mg/dL (ref 6–23)
CO2: 30 meq/L (ref 19–32)
Calcium: 9.7 mg/dL (ref 8.4–10.5)
Chloride: 104 mEq/L (ref 96–112)
Creat: 1.06 mg/dL (ref 0.50–1.10)
Glucose, Bld: 94 mg/dL (ref 70–99)
POTASSIUM: 5.9 meq/L — AB (ref 3.5–5.3)
SODIUM: 142 meq/L (ref 135–145)
TOTAL PROTEIN: 7.1 g/dL (ref 6.0–8.3)

## 2014-05-20 LAB — TSH: TSH: 2.272 u[IU]/mL (ref 0.350–4.500)

## 2014-05-21 LAB — VITAMIN D 25 HYDROXY (VIT D DEFICIENCY, FRACTURES): VIT D 25 HYDROXY: 38 ng/mL (ref 30–100)

## 2014-05-23 ENCOUNTER — Encounter: Payer: Self-pay | Admitting: Family Medicine

## 2014-05-23 NOTE — Assessment & Plan Note (Signed)
Continue exelon patch at current treating dose, no behavioral issues

## 2014-05-23 NOTE — Assessment & Plan Note (Signed)
Controlled, no change in medication  

## 2014-05-23 NOTE — Assessment & Plan Note (Signed)
No current flare , continue as needed claritin

## 2014-05-23 NOTE — Assessment & Plan Note (Signed)
Controlled, no change in medication Hyperlipidemia:Low fat diet discussed and encouraged.  \ 

## 2014-05-24 ENCOUNTER — Encounter (INDEPENDENT_AMBULATORY_CARE_PROVIDER_SITE_OTHER): Payer: Self-pay | Admitting: Internal Medicine

## 2014-05-24 ENCOUNTER — Ambulatory Visit (INDEPENDENT_AMBULATORY_CARE_PROVIDER_SITE_OTHER): Payer: Medicare PPO | Admitting: Internal Medicine

## 2014-05-24 VITALS — BP 140/70 | HR 76 | Temp 97.8°F | Resp 18 | Ht 61.0 in | Wt 120.4 lb

## 2014-05-24 DIAGNOSIS — K219 Gastro-esophageal reflux disease without esophagitis: Secondary | ICD-10-CM

## 2014-05-24 DIAGNOSIS — K5900 Constipation, unspecified: Secondary | ICD-10-CM

## 2014-05-24 MED ORDER — POLYETHYLENE GLYCOL 3350 17 GM/SCOOP PO POWD: 8.5000 g | Freq: Every day | ORAL | Status: DC

## 2014-05-24 NOTE — Progress Notes (Signed)
Presenting complaint;  Follow for GERD epigastric pain and constipation.  Subjective:  Patient is 81 year old African female was in for scheduled visit accompanied by her daughter Santiago Glad. She was last seen on 03/23/2014. She complains of constipation and is taking Hardin Negus' milk of magnesia. She denies melena or rectal bleeding. She has had occasional epigastric pain relieved with Gaviscon. She is having heartburn no more than once or twice a week. Her daughter has noted her to be carrying a cup at home and spitting saliva. However she does not do so when she is at church out of her house.. Patient denies dysphagia. She is doing some walking at home. According to her daughter her memory is not getting better. Her weight is down by less than 1 pound since her last visit.   Current Medications: Outpatient Encounter Prescriptions as of 05/24/2014  Medication Sig  . Alum Hydroxide-Mag Carbonate (GAVISCON PO) Take 10 mLs by mouth daily as needed (acid reflux). Per the patient's daughter she takes 1 by mouth as needed.  Marland Kitchen amLODipine-benazepril (LOTREL) 5-40 MG per capsule TAKE 1 CAPSULE BY MOUTH ONCE A DAY.  . calcium-vitamin D (OSCAL 500/200 D-3) 500-200 MG-UNIT per tablet Take 1 tablet by mouth 2 (two) times daily.  . cloNIDine (CATAPRES) 0.1 MG tablet TAKE ONE TABLET BY MOUTH AT BEDTIME.  Marland Kitchen loratadine (CLARITIN) 10 MG tablet Take 1 tablet (10 mg total) by mouth daily.  . pantoprazole (PROTONIX) 40 MG tablet Take 1 tablet (40 mg total) by mouth daily before breakfast.  . polyvinyl alcohol (ARTIFICIAL TEARS) 1.4 % ophthalmic solution Place 1 drop into both eyes as needed. For dry eyes  . pravastatin (PRAVACHOL) 40 MG tablet TAKE 1 TABLET IN THE EVENING.  . Rivastigmine (EXELON) 13.3 MG/24HR PT24 Apply one patch to skin daily, remove after 24 hours, and replace  . Multiple Vitamin (MULITIVITAMIN WITH MINERALS) TABS Take 1 tablet by mouth daily.     Objective: Blood pressure 140/70, pulse 76,  temperature 97.8 F (36.6 C), temperature source Oral, resp. rate 18, height 5\' 1"  (1.549 m), weight 120 lb 6.4 oz (54.613 kg). Patient is alert and in no acute distress. She is quite. Conjunctiva is pink. Sclera is nonicteric Oropharyngeal mucosa is normal. No neck masses or thyromegaly noted. Abdomen is symmetrical soft and nontender without organomegaly or masses.  No LE edema or clubbing noted.  Labs/studies Results: Comprehensive chemistry panel reviewed from 05/19/2014. Pertinent for same potassium of 5.9 the rest was normal in addressed by Dr. Moshe Cipro.    Assessment:  #1. Chronic GERD and dyspepsia. Heartburn and epigastric pain well controlled with PPI and when necessary use of Gaviscon. #2. Chronic constipation. She needs to take medication regular basis rather than when necessary in order to prevent fecal impaction. #3. History of weight loss. No significant change in weight since her last visit #4. Intermittent spitting of saliva. This appears to have become her habit. It only occurs at home. He does not need further workup. She needs to be reminded to swallow her saliva like she is doing now.   Plan:  Continue high fiber diet. Polyethylene glycol 8.5 g by mouth daily. Dose can be titrated up or down. Office visit in 6 months.

## 2014-05-24 NOTE — Patient Instructions (Signed)
Can titrate polyethylene glycol dose; goal is for her to have soft stool daily or every other day

## 2014-05-26 NOTE — Addendum Note (Signed)
Addended by: Eual Fines on: 05/26/2014 10:15 AM   Modules accepted: Orders

## 2014-05-31 ENCOUNTER — Other Ambulatory Visit: Payer: Self-pay | Admitting: Family Medicine

## 2014-06-04 LAB — BASIC METABOLIC PANEL WITH GFR
BUN: 15 mg/dL (ref 6–23)
CO2: 26 mEq/L (ref 19–32)
Calcium: 9.9 mg/dL (ref 8.4–10.5)
Chloride: 106 mEq/L (ref 96–112)
Creat: 1.16 mg/dL — ABNORMAL HIGH (ref 0.50–1.10)
GFR, EST AFRICAN AMERICAN: 52 mL/min — AB
GFR, EST NON AFRICAN AMERICAN: 45 mL/min — AB
Glucose, Bld: 85 mg/dL (ref 70–99)
POTASSIUM: 5.2 meq/L (ref 3.5–5.3)
Sodium: 144 mEq/L (ref 135–145)

## 2014-06-05 ENCOUNTER — Encounter: Payer: Self-pay | Admitting: Family Medicine

## 2014-06-17 ENCOUNTER — Other Ambulatory Visit: Payer: Self-pay | Admitting: Family Medicine

## 2014-06-24 ENCOUNTER — Encounter: Payer: Self-pay | Admitting: Family Medicine

## 2014-06-24 ENCOUNTER — Other Ambulatory Visit: Payer: Self-pay

## 2014-06-24 DIAGNOSIS — G309 Alzheimer's disease, unspecified: Principal | ICD-10-CM

## 2014-06-24 DIAGNOSIS — F0280 Dementia in other diseases classified elsewhere without behavioral disturbance: Secondary | ICD-10-CM

## 2014-06-24 MED ORDER — EXELON 13.3 MG/24HR TD PT24: MEDICATED_PATCH | TRANSDERMAL | Status: DC

## 2014-08-22 ENCOUNTER — Encounter: Payer: Self-pay | Admitting: Family Medicine

## 2014-08-26 ENCOUNTER — Ambulatory Visit (INDEPENDENT_AMBULATORY_CARE_PROVIDER_SITE_OTHER): Payer: Medicare PPO | Admitting: Family Medicine

## 2014-08-26 ENCOUNTER — Encounter: Payer: Self-pay | Admitting: Family Medicine

## 2014-08-26 VITALS — BP 130/70 | HR 62 | Resp 18 | Ht 61.0 in | Wt 125.1 lb

## 2014-08-26 DIAGNOSIS — I10 Essential (primary) hypertension: Secondary | ICD-10-CM | POA: Diagnosis not present

## 2014-08-26 DIAGNOSIS — G309 Alzheimer's disease, unspecified: Secondary | ICD-10-CM | POA: Diagnosis not present

## 2014-08-26 DIAGNOSIS — F0280 Dementia in other diseases classified elsewhere without behavioral disturbance: Secondary | ICD-10-CM

## 2014-08-26 DIAGNOSIS — G4700 Insomnia, unspecified: Secondary | ICD-10-CM | POA: Diagnosis not present

## 2014-08-26 DIAGNOSIS — K219 Gastro-esophageal reflux disease without esophagitis: Secondary | ICD-10-CM

## 2014-08-26 DIAGNOSIS — E785 Hyperlipidemia, unspecified: Secondary | ICD-10-CM

## 2014-08-26 MED ORDER — TEMAZEPAM 7.5 MG PO CAPS: 7.5000 mg | ORAL_CAPSULE | Freq: Every evening | ORAL | Status: DC | PRN

## 2014-08-26 NOTE — Progress Notes (Signed)
   Subjective:    Patient ID: [name], female    DOB: 13-Feb-1934, 81 y.o.   MRN: [id number]  HPI Pt in with her 2 daughters due to an increase in agitation and combative behavior noted in past approx 4  Weeks, initially triggered by an argument which she has with one of them reportedly Noted to have more difficulty falling and staying asleep , ands is spacing the floor more at night , which is very disturbing to her daughter who herself is facing personal health challenges currently No recent fever or chills, apetite and bowel movements are normal No falls   Review of Systems See HPI, family verifies history, pt unable Denies recent fever or chills. Denies sinus pressure, nasal congestion, ear pain or sore throat. Denies chest congestion, productive cough or wheezing. Denies chest pains, palpitations and leg swelling Denies abdominal pain, nausea, vomiting,diarrhea or constipation.   Denies dysuria, frequency, hesitancy or incontinence.         Objective:   Physical Exam BP 130/70 mmHg  Pulse 62  Resp 18  Ht 5\' 1"  (1.549 m)  Wt 125 lb 1.9 oz (56.754 kg)  BMI 23.65 kg/m2  SpO2 99% Patient alert and in no cardiopulmonary distress.No agitation noted at the visit  HEENT: No facial asymmetry, EOMI,     Chest: Clear to auscultation bilaterally.  CVS: S1, S2 no murmurs, no S3.Regular rate.  ABD: Soft non tender.   Ext: No edema  MS: Adequate ROM spine, shoulders, hips and knees.    Psych: Good eye contact, normal affect. Memory loss , severe, not anxious or depressed appearing.  CNS: CN 2-12 intact, power,  normal throughout.no focal deficits noted.        Assessment & Plan:  Insomnia Sleep hygiene reviewed and written information offered also. Prescription sent for  medication needed. Associated with severe dementia, and noted after confrontation with her daughter when she became angry, and even told her to "get out of the house"   Alzheimer's  disease Progressing despite medication as noted by increased agitation and disturbed sleep at nigth, continue aricept at treating dose   Essential hypertension Controlled, no change in medication    GERD Controlled, no change in medication    Hyperlipidemia Controlled, no change in medication. Updated lab needed at/ before next visit. Hyperlipidemia:Low fat diet discussed and encouraged.   Lipid Panel  Lab Results  Component Value Date   CHOL 164 05/19/2014   HDL 56 05/19/2014   LDLCALC 91 05/19/2014   TRIG 84 05/19/2014   CHOLHDL 2.9 05/19/2014

## 2014-08-26 NOTE — Patient Instructions (Signed)
Annual wellness as before  Call if you need me sooner   Keep well all the best for Spring/ Summer  New is restoril one at night for sleep, every night  Pls send a message in next 1 to 2 weeks if the re is a problem , or if think need a higher dose , I will change one time without office re evaluation  Thanks for choosing [facility], we consider it a privelige to serve you.

## 2014-08-29 NOTE — Assessment & Plan Note (Signed)
Controlled, no change in medication. Updated lab needed at/ before next visit. Hyperlipidemia:Low fat diet discussed and encouraged.   Lipid Panel  Lab Results  Component Value Date   CHOL 164 05/19/2014   HDL 56 05/19/2014   LDLCALC 91 05/19/2014   TRIG 84 05/19/2014   CHOLHDL 2.9 05/19/2014

## 2014-08-29 NOTE — Assessment & Plan Note (Signed)
Controlled, no change in medication  

## 2014-08-29 NOTE — Assessment & Plan Note (Signed)
Sleep hygiene reviewed and written information offered also. Prescription sent for  medication needed. Associated with severe dementia, and noted after confrontation with her daughter when she became angry, and even told her to "get out of the house"

## 2014-08-29 NOTE — Assessment & Plan Note (Signed)
Progressing despite medication as noted by increased agitation and disturbed sleep at nigth, continue aricept at treating dose

## 2014-09-01 ENCOUNTER — Other Ambulatory Visit: Payer: Self-pay | Admitting: Licensed Clinical Social Worker

## 2014-09-01 NOTE — Patient Outreach (Signed)
   Assessment:  CSW received referral on [name].  CSW completed chart review on [name] on 09/01/14.  CSW called [name], daughter of client, on 09/01/14.  CSW verified identity of [name].  CSW introduced self via phone to [name] and informed [name] that [name], primary doctor for client, had referred client to [facility] CSW services for support.  CSW received verbal consent from [name] on 09/01/14 to speak with [name] about the medical needs, nursing needs and social work needs of [name].  CSW and [name] spoke of in home care support needs of client. [name] reported that she was seeking in home care support for client.  She said she was not interested in client's going to care facility but would be interested in seeking in home care support for client for a few hours each week.  CSW talked with [name] about [facility] [place] and gave [name] the phone contact number for that agency [phone]).  [name] wrote down the name and phone number of agency given. CSW talked with [name] about fact that agency typically would do a home assessment visit at home of client to assess needs of client and home environment situation for a potential client.  CSW informed [name] that hourly rates for agency were typically $10.00 per hour for sitter services and $15.00 per hour for in home aide support.  CSW encouraged [name] to call [facility] to speak with appropriate manager at agency to discuss client care needs at present. CSW gave [name] name and phone number of CSW and [name] wrote down this information as well.  CSW spoke with [name] about [facility] consent form completion. [name] agreed for [facility] consent form to be mailed to [name] at home address of client.  CSW encouraged [name] or [name] to contact CSW at [phone] as needed to further discuss current needs of client.    Plan: [name] to contact [facility] [place], [place] to discuss in home support services available through that agency. CSW to call [name] in one week to further discuss current needs of client and further discuss completion of [facility] consent form for client.  [name].[name] MSW, LCSW Licensed Clinical Social Worker [facility] Care Management [phone]

## 2014-09-06 ENCOUNTER — Other Ambulatory Visit: Payer: Self-pay | Admitting: Family Medicine

## 2014-09-08 ENCOUNTER — Other Ambulatory Visit: Payer: Self-pay | Admitting: Licensed Clinical Social Worker

## 2014-09-08 NOTE — Patient Outreach (Signed)
   Assessment:  CSW spoke via phone with [name], daughter of client, on 09/08/14. CSW verified identity of [name].  CSW and [name] spoke of current needs of client.  CSW and [name] spoke of [facility] consent form completion.  [name] asked if [facility] office could remail [facility] consent form and return envelope to client at home address of client.  CSW informed [name] that CSW would communicate with [name] at [facility] office on 09/08/14 and request [name] [facility] consent form to client. [name] and CSW completed needed assessment information for client.  [name] said client has mild dementia and she is seeking in home care support for client.  CSW spoke with [name] about several in home care support agencies in the area. CSW had previously given [name] the name and phone number of [facility] in [place], [place].  [name] said she was still considering options for in home care support for client and had not called in home care agency as of yet to discuss needs of client.  CSW reminded [name] that Dr. Moshe Cipro, primary doctor for client, had made [facility] referral for client.  CSW reminded [name] that since client saw Dr. Moshe Cipro, a [facility] provider, [facility] services were thus free to client.  CSW encouraged [name] or client to call CSW at [phone] as needed for social work support for client. [name] was appreciative of call from [place] on 09/08/14.  Plan:  Client/daughter of client to communicate with home care agencies of choice to discuss in home care support for client. Client to take medications as prescribed and to attend scheduled medical appointments. CSW to call client/daughter of client in two weeks to further discuss needs of client and further discuss [facility] consent form completion. CSW called [name] on 09/08/14 and requested that [name] [facility] consent form and return envelope to client home address.  [name] said he would remail consent form and return envelope to  client home address today.   Norva Riffle.Buddie Marston MSW, LCSW Licensed Clinical Social Worker [facility] Care Management [phone]

## 2014-09-15 ENCOUNTER — Encounter: Payer: Self-pay | Admitting: Family Medicine

## 2014-09-16 ENCOUNTER — Other Ambulatory Visit: Payer: Self-pay | Admitting: Family Medicine

## 2014-09-16 ENCOUNTER — Other Ambulatory Visit: Payer: Self-pay

## 2014-09-16 MED ORDER — QUETIAPINE FUMARATE 25 MG PO TABS: ORAL_TABLET | ORAL | Status: DC

## 2014-09-16 NOTE — Telephone Encounter (Signed)
Daughter aware of advice and addition of medication.

## 2014-09-17 ENCOUNTER — Other Ambulatory Visit: Payer: Self-pay | Admitting: Family Medicine

## 2014-09-17 ENCOUNTER — Encounter: Payer: Self-pay | Admitting: Family Medicine

## 2014-09-20 ENCOUNTER — Other Ambulatory Visit: Payer: Self-pay

## 2014-09-20 MED ORDER — AMLODIPINE BESY-BENAZEPRIL HCL 5-40 MG PO CAPS: ORAL_CAPSULE | ORAL | Status: DC

## 2014-09-21 ENCOUNTER — Ambulatory Visit (INDEPENDENT_AMBULATORY_CARE_PROVIDER_SITE_OTHER): Payer: Medicare PPO | Admitting: Internal Medicine

## 2014-09-21 ENCOUNTER — Other Ambulatory Visit: Payer: Self-pay | Admitting: Licensed Clinical Social Worker

## 2014-09-21 ENCOUNTER — Encounter (INDEPENDENT_AMBULATORY_CARE_PROVIDER_SITE_OTHER): Payer: Self-pay | Admitting: Internal Medicine

## 2014-09-21 VITALS — BP 122/74 | HR 70 | Temp 98.3°F | Resp 18 | Ht 61.0 in | Wt 125.1 lb

## 2014-09-21 DIAGNOSIS — K5900 Constipation, unspecified: Secondary | ICD-10-CM | POA: Diagnosis not present

## 2014-09-21 DIAGNOSIS — K219 Gastro-esophageal reflux disease without esophagitis: Secondary | ICD-10-CM

## 2014-09-21 DIAGNOSIS — R634 Abnormal weight loss: Secondary | ICD-10-CM

## 2014-09-21 DIAGNOSIS — R1013 Epigastric pain: Secondary | ICD-10-CM

## 2014-09-21 MED ORDER — POLYETHYLENE GLYCOL 3350 17 GM/SCOOP PO POWD: 8.5000 g | Freq: Every day | ORAL | Status: DC | PRN

## 2014-09-21 NOTE — Progress Notes (Signed)
Presenting complaint;  Follow-up for GERD constipation epigastric pain and weight loss.  Subjective:   [name] 81 year old African-American female was here for scheduled visit accompanied by her daughter  [name]. Patient was last seen 4 months ago. She has gained 5 pounds since her last visit.  She has no complaints. She denies heartburn nausea vomiting abdominal pain. She is taking Gaviscon after evening meal and believes it helps her. She is still having problems with as the patient. Bowels move 3-4 times a week. Her daughter is afraid to give her polyethylene glycol on regular basis can she is afraid she will become dependent. Patient is trying to walk every day. She cooks her own breakfast and evening meal. Her daughter is also concerned that she refuses to eat greens.   Current Medications: Outpatient Encounter Prescriptions as of 09/21/2014  Medication Sig  . Alum Hydroxide-Mag Carbonate (GAVISCON PO) Take 10 mLs by mouth daily as needed (acid reflux). Per the patient's daughter she takes 1 by mouth as needed.  Marland Kitchen amLODipine-benazepril (LOTREL) 5-40 MG per capsule TAKE 1 CAPSULE BY MOUTH ONCE A DAY.  . calcium-vitamin D (OSCAL 500/200 D-3) 500-200 MG-UNIT per tablet Take 1 tablet by mouth 2 (two) times daily.  . cloNIDine (CATAPRES) 0.1 MG tablet TAKE ONE TABLET BY MOUTH AT BEDTIME.  . EXELON 13.3 MG/24HR PT24 Apply one patch to skin daily, remove after 24 hours, and replace  . Multiple Vitamin (MULITIVITAMIN WITH MINERALS) TABS Take 1 tablet by mouth daily.  . pantoprazole (PROTONIX) 40 MG tablet Take 1 tablet (40 mg total) by mouth daily before breakfast.  . polyethylene glycol powder (GLYCOLAX/MIRALAX) powder Take 8.5 g by mouth daily.  . polyvinyl alcohol (ARTIFICIAL TEARS) 1.4 % ophthalmic solution Place 1 drop into both eyes as needed. For dry eyes  . pravastatin (PRAVACHOL) 40 MG tablet TAKE 1 TABLET IN THE EVENING.  . QUEtiapine (SEROQUEL) 25 MG tablet Half tablet at bedtime  .  temazepam (RESTORIL) 7.5 MG capsule Take 1 capsule (7.5 mg total) by mouth at bedtime as needed for sleep.  . [DISCONTINUED] loratadine (CLARITIN) 10 MG tablet Take 1 tablet (10 mg total) by mouth daily. (Patient not taking: Reported on 09/21/2014)   No facility-administered encounter medications on file as of 09/21/2014.     Objective: Blood pressure 122/74, pulse 70, temperature 98.3 F (36.8 C), temperature source Oral, resp. rate 18, height 5\' 1"  (1.549 m), weight 125 lb 1.6 oz (56.745 kg). Patient is alert and in no acute distress Conjunctiva is pink. Sclera is nonicteric Oropharyngeal mucosa is normal. No neck masses or thyromegaly noted. Cardiac exam with regular rhythm normal S1 and S2. No murmur or gallop noted. Lungs are clear to auscultation. Abdomen is soft and nontender without organomegaly or masses.  No LE edema or clubbing noted.   Assessment:  #1. Weight loss. She has gained 21 pounds and is close to her baseline. Her appetite has improved. #2. GERD.  Heartburns well controlled with therapy. #3. Epigastric pain consistent with chronic dyspepsia. She is doing better with PPI and Gaviscon. #4. Constipation. Patient can use polyethylene glycol on schedule and she also  Should increase consumption of fiber rich foods.   Plan:  Patient encouraged to increase intake of fiber foods and green leafy vegetables. Can use polyethylene glycol every other day or as needed. Office visit in 6 months

## 2014-09-21 NOTE — Patient Outreach (Signed)
   Assessment: CSW called phone number for [name] on 09/21/14.  CSW was not able to speak via phone with [name] on 09/21/14 but did leave phone message for [name] requesting she call CSW at [phone] to speak about current needs of client. CSW had previously talked with [name] about in home care support agencies for client.  CSW also had spoken with [name] about [name] consent form completion for client.  [name] had remailed [name] consent form to client last week.    Plan: Client to take medications as prescribed and to attend scheduled medical appointments. [name] to call CSW at [phone] to discuss current status/needs of client. If [name] does not call CSW within 48 hours, then CSW will again call [name] next week via phone to speak with her about needs of client.   [name].[name] MSW, LCSW Licensed Clinical Social Worker [facility] Care Management [phone]

## 2014-09-21 NOTE — Patient Instructions (Signed)
High fiber diet as discussed.

## 2014-10-07 ENCOUNTER — Other Ambulatory Visit: Payer: Self-pay | Admitting: Family Medicine

## 2014-10-12 ENCOUNTER — Ambulatory Visit (INDEPENDENT_AMBULATORY_CARE_PROVIDER_SITE_OTHER): Payer: Medicare PPO | Admitting: Urology

## 2014-10-12 DIAGNOSIS — C674 Malignant neoplasm of posterior wall of bladder: Secondary | ICD-10-CM

## 2014-10-13 ENCOUNTER — Encounter: Payer: Self-pay | Admitting: Family Medicine

## 2014-10-19 ENCOUNTER — Encounter: Payer: Self-pay | Admitting: Family Medicine

## 2014-10-19 ENCOUNTER — Other Ambulatory Visit: Payer: Self-pay

## 2014-10-19 MED ORDER — AMLODIPINE BESY-BENAZEPRIL HCL 5-40 MG PO CAPS: ORAL_CAPSULE | ORAL | Status: DC

## 2014-10-27 ENCOUNTER — Encounter: Payer: Self-pay | Admitting: Family Medicine

## 2014-10-29 ENCOUNTER — Encounter: Payer: Self-pay | Admitting: Licensed Clinical Social Worker

## 2014-10-29 NOTE — Patient Outreach (Signed)
[facility]) Care Management  [facility] Social Work  10/29/2014  EMOGENE MURATALLA 1934-03-17 [id number]  Subjective:    Objective:   Current Medications:  Current Outpatient Prescriptions  Medication Sig Dispense Refill  . Alum Hydroxide-Mag Carbonate (GAVISCON PO) Take 10 mLs by mouth daily as needed (acid reflux). Per the patient's daughter she takes 1 by mouth as needed.    Marland Kitchen amLODipine-benazepril (LOTREL) 5-40 MG per capsule TAKE 1 CAPSULE BY MOUTH ONCE A DAY. 30 capsule 5  . calcium-vitamin D (OSCAL 500/200 D-3) 500-200 MG-UNIT per tablet Take 1 tablet by mouth 2 (two) times daily.    . cloNIDine (CATAPRES) 0.1 MG tablet TAKE ONE TABLET BY MOUTH AT BEDTIME. 30 tablet 3  . EXELON 13.3 MG/24HR PT24 Apply one patch to skin daily, remove after 24 hours, and replace 30 patch 6  . Multiple Vitamin (MULITIVITAMIN WITH MINERALS) TABS Take 1 tablet by mouth daily.    . pantoprazole (PROTONIX) 40 MG tablet Take 1 tablet (40 mg total) by mouth daily before breakfast. 30 tablet 5  . polyethylene glycol powder (GLYCOLAX/MIRALAX) powder Take 8.5 g by mouth daily as needed. 255 g 5  . polyvinyl alcohol (ARTIFICIAL TEARS) 1.4 % ophthalmic solution Place 1 drop into both eyes as needed. For dry eyes    . pravastatin (PRAVACHOL) 40 MG tablet TAKE 1 TABLET IN THE EVENING. 30 tablet 3  . pravastatin (PRAVACHOL) 40 MG tablet TAKE 1 TABLET IN THE EVENING. 30 tablet 3  . QUEtiapine (SEROQUEL) 25 MG tablet TAKE 1/2 TABLET BY MOUTH AT BEDTIME. 15 tablet 3  . temazepam (RESTORIL) 7.5 MG capsule Take 1 capsule (7.5 mg total) by mouth at bedtime as needed for sleep. 30 capsule 2   No current facility-administered medications for this visit.    Functional Status:  In your present state of health, do you have any difficulty performing the following activities: 09/08/2014 05/19/2014  Hearing? N Y  Vision? N N  Difficulty concentrating or making decisions? Tempie Donning  Walking or climbing stairs? N N   Dressing or bathing? N N  Doing errands, shopping? N Y  Conservation officer, nature and eating ? N -  Using the Toilet? N -  In the past six months, have you accidently leaked urine? N -  Do you have problems with loss of bowel control? N -  Managing your Medications? Y -  Managing your Finances? Y -  Housekeeping or managing your Housekeeping? Y -    Fall/Depression Screening:  PHQ 2/9 Scores 09/08/2014 01/22/2013  PHQ - 2 Score 0 0    Assessment:  CSW completed chart review on client on 10/29/14.  CSW has talked several times with Suanne Marker, daughter of client about [facility] consent form and completion of [facility] consent form.  [name], case management assistant, has mailed [facility] consent form and return envelope to client/daughter of client on two occasions but client/daughter of client has not completed and returned [facility] consent form. Thus, CSW is closing case, discharging Geroldine Esquivias from [facility] CSW services on 10/29/14 due to fact that client/daughter of client has not completed and returned requested [facility] consent form.   Plan: CSW to inform Lurline Del on 10/29/14 that [facility] discharged client from [facility] on 10/29/14 due to fact that client/daughter of client has not completed and returned needed [facility] consent form for client. CSW sent physician case closure letter to primary doctor of client on 10/29/14 giving primary doctor reason for case closure. CSW  wrote client discharge letter to client on 10/29/14 and routed letter to Lurline Del to mail client discharge letter to client.  Norva Riffle.Yalanda Soderman MSW, LCSW Licensed Clinical Social Worker [facility] Care Management [phone]  Plan:

## 2014-10-30 ENCOUNTER — Other Ambulatory Visit: Payer: Self-pay | Admitting: Family Medicine

## 2014-10-30 ENCOUNTER — Telehealth: Payer: Self-pay | Admitting: Family Medicine

## 2014-10-30 DIAGNOSIS — F03918 Unspecified dementia, unspecified severity, with other behavioral disturbance: Secondary | ICD-10-CM

## 2014-10-30 DIAGNOSIS — F0391 Unspecified dementia with behavioral disturbance: Secondary | ICD-10-CM

## 2014-10-30 NOTE — Telephone Encounter (Signed)
Pls see pt message response which I have directly sent to pt's daughter, call her and ensure she understands any questions/ concerns pls let me know

## 2014-11-01 NOTE — Telephone Encounter (Signed)
Daughter made aware of message

## 2014-11-01 NOTE — Patient Outreach (Signed)
[facility]) Care Management  11/01/2014  [name] 1933/09/12 [id number]   Notification from Theadore Nan, LCSW to close case due to patient refused to sign consent for [place] Management services.  [name]. [place], [place] Management [place] Assistant Phone: [phone] Fax: [phone]

## 2014-11-03 ENCOUNTER — Other Ambulatory Visit: Payer: Self-pay

## 2014-11-03 MED ORDER — QUETIAPINE FUMARATE 25 MG PO TABS: ORAL_TABLET | ORAL | Status: DC

## 2014-11-08 ENCOUNTER — Encounter: Payer: Self-pay | Admitting: Family Medicine

## 2014-11-09 ENCOUNTER — Other Ambulatory Visit: Payer: Self-pay | Admitting: Family Medicine

## 2014-11-09 ENCOUNTER — Other Ambulatory Visit: Payer: Self-pay

## 2014-11-09 MED ORDER — CLONIDINE HCL 0.1 MG PO TABS: 0.1000 mg | ORAL_TABLET | Freq: Every day | ORAL | Status: DC

## 2014-11-16 ENCOUNTER — Telehealth: Payer: Self-pay | Admitting: *Deleted

## 2014-11-16 NOTE — Telephone Encounter (Signed)
Abolutely, pls refer and let family kinow

## 2014-11-16 NOTE — Telephone Encounter (Signed)
Ruby from [facility] called stating Dr. Harrington Challenger wants to refer patient to Dr. Mena Pauls in [place], Dr. Mena Pauls specializes in geriatric. Dr. Mena Pauls [phone]

## 2014-11-16 NOTE — Telephone Encounter (Signed)
Do you agree with referral?  Dr. Harrington Challenger will be unable to see patient until end of August early September.

## 2014-11-17 ENCOUNTER — Encounter: Payer: Self-pay | Admitting: Family Medicine

## 2014-11-17 ENCOUNTER — Ambulatory Visit (INDEPENDENT_AMBULATORY_CARE_PROVIDER_SITE_OTHER): Payer: Medicare PPO | Admitting: Family Medicine

## 2014-11-17 VITALS — BP 140/68 | HR 86 | Resp 18 | Ht 61.0 in | Wt 133.0 lb

## 2014-11-17 DIAGNOSIS — E785 Hyperlipidemia, unspecified: Secondary | ICD-10-CM

## 2014-11-17 DIAGNOSIS — G309 Alzheimer's disease, unspecified: Secondary | ICD-10-CM

## 2014-11-17 DIAGNOSIS — Z0000 Encounter for general adult medical examination without abnormal findings: Secondary | ICD-10-CM

## 2014-11-17 DIAGNOSIS — I10 Essential (primary) hypertension: Secondary | ICD-10-CM

## 2014-11-17 DIAGNOSIS — F419 Anxiety disorder, unspecified: Secondary | ICD-10-CM

## 2014-11-17 DIAGNOSIS — E559 Vitamin D deficiency, unspecified: Secondary | ICD-10-CM

## 2014-11-17 DIAGNOSIS — F0280 Dementia in other diseases classified elsewhere without behavioral disturbance: Secondary | ICD-10-CM
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
## 2014-11-17 NOTE — Patient Instructions (Signed)
F/u in  4 month, call if you need me before   You are referred to Geriatric psychiatrist  As discussed   [place]  Fasting lipid, cmp , CBc and vit D in the next week please  Thanks for choosing [facility], we consider it a privelige to serve you.   PLS start dancing! No changes in medication

## 2014-11-17 NOTE — Assessment & Plan Note (Addendum)
Annual exam as documented. Counseling done  re healthy lifestyle involving commitment to 150 minutes exercise per week, heart healthy diet, and attaining healthy weight.The importance of adequate sleep also discussed. Regular seat belt use and home safety, is also discussed.  Immunization and cancer screening needs are specifically addressed at this visit.  

## 2014-11-17 NOTE — Telephone Encounter (Signed)
Daughter given information on Dr. Marchelle Gearing.  She will call back with her decision.

## 2014-11-21 ENCOUNTER — Encounter: Payer: Self-pay | Admitting: Family Medicine
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
## 2014-11-22 ENCOUNTER — Ambulatory Visit (INDEPENDENT_AMBULATORY_CARE_PROVIDER_SITE_OTHER): Payer: Medicare PPO | Admitting: Internal Medicine

## 2014-11-22 ENCOUNTER — Encounter (INDEPENDENT_AMBULATORY_CARE_PROVIDER_SITE_OTHER): Payer: Self-pay | Admitting: Internal Medicine

## 2014-11-22 ENCOUNTER — Other Ambulatory Visit: Payer: Self-pay

## 2014-11-22 VITALS — BP 110/68 | HR 72 | Temp 98.3°F | Resp 18 | Ht 61.0 in | Wt 133.0 lb

## 2014-11-22 DIAGNOSIS — R1013 Epigastric pain: Secondary | ICD-10-CM | POA: Diagnosis not present

## 2014-11-22 DIAGNOSIS — G4700 Insomnia, unspecified: Secondary | ICD-10-CM

## 2014-11-22 DIAGNOSIS — K219 Gastro-esophageal reflux disease without esophagitis: Secondary | ICD-10-CM | POA: Diagnosis not present

## 2014-11-22 DIAGNOSIS — Z789 Other specified health status: Secondary | ICD-10-CM | POA: Diagnosis not present

## 2014-11-22 DIAGNOSIS — Z87898 Personal history of other specified conditions: Secondary | ICD-10-CM

## 2014-11-22 MED ORDER — TEMAZEPAM 7.5 MG PO CAPS: 7.5000 mg | ORAL_CAPSULE | Freq: Every evening | ORAL | Status: DC | PRN

## 2014-11-22 NOTE — Progress Notes (Signed)
Presenting complaint;  Follow-up for GERD epigastric pain and weight issues.  Subjective:  Patient is 81 year old African-American female who was last seen 2 months ago and supposed return in 6 months but due to mixup came today. She is accompanied by her daughter [name]. Patient has no complaints. According to her daughter she's had few episodes of epigastric pain in the evening relieved with Gaviscon. She has very good appetite and eats potatoes every day. She denies heartburn dysphagia nausea vomiting melena or rectal bleeding. She is taking polyethylene glycol on as-needed basis. He has bowel movement on most days. He has gained 8 pounds in the last 2 months. Her daughter states that she was busy with her own treatment and her mother has not been cutting and exercising like she was doing before. [name] states that she will start walking with her mother again.   Current Medications: Outpatient Encounter Prescriptions as of 11/22/2014  Medication Sig  . Alum Hydroxide-Mag Carbonate (GAVISCON PO) Take 10 mLs by mouth daily as needed (acid reflux). Per the patient's daughter she takes 1 by mouth as needed.  Marland Kitchen amLODipine-benazepril (LOTREL) 5-40 MG per capsule TAKE 1 CAPSULE BY MOUTH ONCE A DAY.  . calcium-vitamin D (OSCAL 500/200 D-3) 500-200 MG-UNIT per tablet Take 1 tablet by mouth 2 (two) times daily.  . cloNIDine (CATAPRES) 0.1 MG tablet Take 1 tablet (0.1 mg total) by mouth at bedtime.  . EXELON 13.3 MG/24HR PT24 Apply one patch to skin daily, remove after 24 hours, and replace  . Multiple Vitamin (MULITIVITAMIN WITH MINERALS) TABS Take 1 tablet by mouth daily.  . pantoprazole (PROTONIX) 40 MG tablet Take 1 tablet (40 mg total) by mouth daily before breakfast.  . polyethylene glycol powder (GLYCOLAX/MIRALAX) powder Take 8.5 g by mouth daily as needed.  . polyvinyl alcohol (ARTIFICIAL TEARS) 1.4 % ophthalmic solution Place 1 drop into both eyes as needed. For dry eyes  . pravastatin  (PRAVACHOL) 40 MG tablet TAKE 1 TABLET IN THE EVENING.  . QUEtiapine (SEROQUEL) 25 MG tablet One and a half tablets at bedtime  . temazepam (RESTORIL) 7.5 MG capsule Take 1 capsule (7.5 mg total) by mouth at bedtime as needed for sleep.  . [DISCONTINUED] temazepam (RESTORIL) 7.5 MG capsule Take 1 capsule (7.5 mg total) by mouth at bedtime as needed for sleep.   No facility-administered encounter medications on file as of 11/22/2014.     Objective: Blood pressure 110/68, pulse 72, temperature 98.3 F (36.8 C), temperature source Oral, resp. rate 18, height 5\' 1"  (1.549 m), weight 133 lb (60.328 kg). Patient is alert and in no acute distress. Conjunctiva is pink. Sclera is nonicteric Oropharyngeal mucosa is normal. No neck masses or thyromegaly noted. Cardiac exam with regular rhythm normal S1 and S2. No murmur or gallop noted. Lungs are clear to auscultation. Abdomen is symmetrical soft and nontender without organomegaly or masses. No LE edema or clubbing noted.     Assessment:  #1. GERD. Symptoms are well controlled with therapy. This is the best she has done since been working with her. #2. Chronic epigastric pain. She has occasional pain relieved with Gaviscon. #3. History of weight loss. He has gained 30 pounds from low 103 pounds 2 years ago and she has gained 8 pounds in the last 2 months. Weight gain appears to be increase appetite and lack of exercise or physical activity.   Plan:  Patient encouraged to walk or exercise daily. She also needs to cut back on intake of carbs. Patient's  daughter will call if her weight exceeds 140 pounds. Office visit in one year unless symptoms relapse.

## 2014-11-22 NOTE — Patient Instructions (Signed)
Need to walk daily for at least 20 minutes. Call if weight exceeds 140 lbs.

## 2014-11-23 LAB — CBC WITH DIFFERENTIAL/PLATELET
Basophils Absolute: 0 10*3/uL (ref 0.0–0.1)
Basophils Relative: 0 % (ref 0–1)
EOS ABS: 0.3 10*3/uL (ref 0.0–0.7)
EOS PCT: 5 % (ref 0–5)
HCT: 41.9 % (ref 36.0–46.0)
Hemoglobin: 13.6 g/dL (ref 12.0–15.0)
LYMPHS ABS: 1.9 10*3/uL (ref 0.7–4.0)
Lymphocytes Relative: 34 % (ref 12–46)
MCH: 30.8 pg (ref 26.0–34.0)
MCHC: 32.5 g/dL (ref 30.0–36.0)
MCV: 94.8 fL (ref 78.0–100.0)
MONO ABS: 0.6 10*3/uL (ref 0.1–1.0)
MONOS PCT: 10 % (ref 3–12)
MPV: 10.2 fL (ref 8.6–12.4)
Neutro Abs: 2.9 10*3/uL (ref 1.7–7.7)
Neutrophils Relative %: 51 % (ref 43–77)
PLATELETS: 267 10*3/uL (ref 150–400)
RBC: 4.42 MIL/uL (ref 3.87–5.11)
RDW: 14.4 % (ref 11.5–15.5)
WBC: 5.7 10*3/uL (ref 4.0–10.5)

## 2014-11-23 LAB — COMPREHENSIVE METABOLIC PANEL
ALT: 11 U/L (ref 0–35)
AST: 23 U/L (ref 0–37)
Albumin: 3.7 g/dL (ref 3.5–5.2)
Alkaline Phosphatase: 59 U/L (ref 39–117)
BILIRUBIN TOTAL: 0.9 mg/dL (ref 0.2–1.2)
BUN: 15 mg/dL (ref 6–23)
CALCIUM: 9.7 mg/dL (ref 8.4–10.5)
CHLORIDE: 102 meq/L (ref 96–112)
CO2: 28 mEq/L (ref 19–32)
CREATININE: 1.12 mg/dL — AB (ref 0.50–1.10)
GLUCOSE: 91 mg/dL (ref 70–99)
Potassium: 4.5 mEq/L (ref 3.5–5.3)
SODIUM: 140 meq/L (ref 135–145)
Total Protein: 7 g/dL (ref 6.0–8.3)

## 2014-11-23 LAB — LIPID PANEL
CHOL/HDL RATIO: 2.6 ratio
Cholesterol: 172 mg/dL (ref 0–200)
HDL: 67 mg/dL (ref >=46)
LDL Cholesterol: 94 mg/dL (ref 0–99)
TRIGLYCERIDES: 55 mg/dL (ref <150)
VLDL: 11 mg/dL (ref 0–40)

## 2014-11-24 ENCOUNTER — Encounter: Payer: Self-pay | Admitting: Family Medicine

## 2014-11-24 LAB — VITAMIN D 25 HYDROXY (VIT D DEFICIENCY, FRACTURES): Vit D, 25-Hydroxy: 30 ng/mL (ref 30–100)

## 2014-12-09 ENCOUNTER — Other Ambulatory Visit (INDEPENDENT_AMBULATORY_CARE_PROVIDER_SITE_OTHER): Payer: Self-pay | Admitting: Internal Medicine

## 2014-12-09 ENCOUNTER — Encounter (INDEPENDENT_AMBULATORY_CARE_PROVIDER_SITE_OTHER): Payer: Self-pay | Admitting: Internal Medicine

## 2014-12-10 ENCOUNTER — Other Ambulatory Visit (INDEPENDENT_AMBULATORY_CARE_PROVIDER_SITE_OTHER): Payer: Self-pay | Admitting: *Deleted

## 2014-12-10 DIAGNOSIS — R1013 Epigastric pain: Secondary | ICD-10-CM

## 2014-12-10 MED ORDER — PANTOPRAZOLE SODIUM 40 MG PO TBEC: 40.0000 mg | DELAYED_RELEASE_TABLET | Freq: Every day | ORAL | Status: DC

## 2014-12-10 NOTE — Telephone Encounter (Signed)
Patient's daughter called and states that her Mom will be out of Pantoprazole today. Requesting that a prescription be sent to Pharmacy.

## 2015-01-06 ENCOUNTER — Telehealth: Payer: Self-pay

## 2015-01-06 DIAGNOSIS — Z79899 Other long term (current) drug therapy: Secondary | ICD-10-CM

## 2015-01-17 ENCOUNTER — Other Ambulatory Visit: Payer: Self-pay | Admitting: Family Medicine

## 2015-01-17 LAB — COMPREHENSIVE METABOLIC PANEL
ALT: 17 U/L (ref 6–29)
AST: 30 U/L (ref 10–35)
Albumin: 3.8 g/dL (ref 3.6–5.1)
Alkaline Phosphatase: 45 U/L (ref 33–130)
BUN: 15 mg/dL (ref 7–25)
CHLORIDE: 102 mmol/L (ref 98–110)
CO2: 29 mmol/L (ref 20–31)
CREATININE: 1.34 mg/dL — AB (ref 0.60–0.93)
Calcium: 9.2 mg/dL (ref 8.6–10.4)
GLUCOSE: 83 mg/dL (ref 65–99)
Potassium: 4.3 mmol/L (ref 3.5–5.3)
SODIUM: 140 mmol/L (ref 135–146)
Total Bilirubin: 0.9 mg/dL (ref 0.2–1.2)
Total Protein: 6.6 g/dL (ref 6.1–8.1)

## 2015-01-17 LAB — CBC WITH DIFFERENTIAL/PLATELET
BASOS PCT: 0 % (ref 0–1)
Basophils Absolute: 0 10*3/uL (ref 0.0–0.1)
EOS ABS: 0.1 10*3/uL (ref 0.0–0.7)
Eosinophils Relative: 2 % (ref 0–5)
HCT: 42.6 % (ref 36.0–46.0)
HEMOGLOBIN: 14.1 g/dL (ref 12.0–15.0)
Lymphocytes Relative: 37 % (ref 12–46)
Lymphs Abs: 1.4 10*3/uL (ref 0.7–4.0)
MCH: 30.9 pg (ref 26.0–34.0)
MCHC: 33.1 g/dL (ref 30.0–36.0)
MCV: 93.4 fL (ref 78.0–100.0)
MPV: 10.7 fL (ref 8.6–12.4)
Monocytes Absolute: 0.4 10*3/uL (ref 0.1–1.0)
Monocytes Relative: 10 % (ref 3–12)
NEUTROS ABS: 2 10*3/uL (ref 1.7–7.7)
NEUTROS PCT: 51 % (ref 43–77)
PLATELETS: 172 10*3/uL (ref 150–400)
RBC: 4.56 MIL/uL (ref 3.87–5.11)
RDW: 14.6 % (ref 11.5–15.5)
WBC: 3.9 10*3/uL — ABNORMAL LOW (ref 4.0–10.5)

## 2015-01-17 LAB — TSH: TSH: 1.746 u[IU]/mL (ref 0.350–4.500)

## 2015-01-17 LAB — VALPROIC ACID LEVEL: Valproic Acid Lvl: 72.4 ug/mL (ref 50.0–100.0)

## 2015-01-31 ENCOUNTER — Ambulatory Visit (INDEPENDENT_AMBULATORY_CARE_PROVIDER_SITE_OTHER): Payer: Medicare PPO

## 2015-01-31 DIAGNOSIS — Z23 Encounter for immunization: Secondary | ICD-10-CM | POA: Diagnosis not present

## 2015-02-01 ENCOUNTER — Encounter: Payer: Self-pay | Admitting: Family Medicine

## 2015-02-08 ENCOUNTER — Telehealth: Payer: Self-pay

## 2015-02-08 DIAGNOSIS — G4700 Insomnia, unspecified: Secondary | ICD-10-CM

## 2015-02-08 MED ORDER — TEMAZEPAM 15 MG PO CAPS
15.0000 mg | ORAL_CAPSULE | Freq: Every evening | ORAL | Status: DC | PRN
Start: 2015-02-08 — End: 2015-05-16

## 2015-02-08 NOTE — Telephone Encounter (Signed)
Noted  

## 2015-02-08 NOTE — Addendum Note (Signed)
Addended by: Denman George B on: 02/08/2015 11:28 AM   Modules accepted: Orders

## 2015-02-08 NOTE — Telephone Encounter (Signed)
pls change to 15 mg once daily dose , I will sign, script , let family know, thanks

## 2015-02-17 ENCOUNTER — Other Ambulatory Visit: Payer: Self-pay | Admitting: Family Medicine

## 2015-02-18 ENCOUNTER — Other Ambulatory Visit: Payer: Self-pay

## 2015-02-18 DIAGNOSIS — F0281 Dementia in other diseases classified elsewhere with behavioral disturbance: Secondary | ICD-10-CM

## 2015-02-18 DIAGNOSIS — G309 Alzheimer's disease, unspecified: Principal | ICD-10-CM

## 2015-02-18 MED ORDER — EXELON 13.3 MG/24HR TD PT24: MEDICATED_PATCH | TRANSDERMAL | Status: DC

## 2015-02-22 ENCOUNTER — Other Ambulatory Visit: Payer: Self-pay

## 2015-02-22 ENCOUNTER — Encounter: Payer: Self-pay | Admitting: Family Medicine

## 2015-02-22 DIAGNOSIS — J302 Other seasonal allergic rhinitis: Secondary | ICD-10-CM

## 2015-02-22 MED ORDER — LORATADINE 10 MG PO TABS: 10.0000 mg | ORAL_TABLET | Freq: Every day | ORAL | Status: DC

## 2015-03-01 ENCOUNTER — Other Ambulatory Visit: Payer: Self-pay | Admitting: Family Medicine

## 2015-03-07 ENCOUNTER — Other Ambulatory Visit: Payer: Self-pay

## 2015-03-07 ENCOUNTER — Encounter: Payer: Self-pay | Admitting: Family Medicine

## 2015-03-07 MED ORDER — CLONIDINE HCL 0.1 MG PO TABS: 0.1000 mg | ORAL_TABLET | Freq: Every day | ORAL | Status: DC

## 2015-03-21 ENCOUNTER — Encounter: Payer: Self-pay | Admitting: Family Medicine

## 2015-03-28 ENCOUNTER — Ambulatory Visit (INDEPENDENT_AMBULATORY_CARE_PROVIDER_SITE_OTHER): Payer: Medicare PPO | Admitting: Internal Medicine

## 2015-04-04 ENCOUNTER — Telehealth: Payer: Self-pay | Admitting: Family Medicine

## 2015-04-04 NOTE — Telephone Encounter (Signed)
pls see tele message I sent this pts daughter , there is no response from her, pls call her , and update me on responses, she still has no appt in system

## 2015-04-06 NOTE — Telephone Encounter (Signed)
No additional labs needed.

## 2015-04-06 NOTE — Telephone Encounter (Signed)
Called and scheduled followup visit for patient.  Will mail out appointment card and any needed labs.

## 2015-04-14 ENCOUNTER — Other Ambulatory Visit: Payer: Self-pay | Admitting: Family Medicine

## 2015-04-14 ENCOUNTER — Encounter: Payer: Self-pay | Admitting: Family Medicine

## 2015-04-20 ENCOUNTER — Emergency Department (HOSPITAL_COMMUNITY)
Admission: EM | Admit: 2015-04-20 | Discharge: 2015-04-20 | Disposition: A | Discharge status: Home / Self Care | Payer: Medicare PPO | Attending: Emergency Medicine | Admitting: Emergency Medicine

## 2015-04-20 ENCOUNTER — Encounter (HOSPITAL_COMMUNITY): Payer: Self-pay | Admitting: *Deleted

## 2015-04-20 DIAGNOSIS — Z8719 Personal history of other diseases of the digestive system: Secondary | ICD-10-CM | POA: Diagnosis not present

## 2015-04-20 DIAGNOSIS — Z79899 Other long term (current) drug therapy: Secondary | ICD-10-CM | POA: Insufficient documentation

## 2015-04-20 DIAGNOSIS — Z8551 Personal history of malignant neoplasm of bladder: Secondary | ICD-10-CM | POA: Diagnosis not present

## 2015-04-20 DIAGNOSIS — K219 Gastro-esophageal reflux disease without esophagitis: Secondary | ICD-10-CM | POA: Insufficient documentation

## 2015-04-20 DIAGNOSIS — F0390 Unspecified dementia without behavioral disturbance: Secondary | ICD-10-CM | POA: Diagnosis not present

## 2015-04-20 DIAGNOSIS — I10 Essential (primary) hypertension: Secondary | ICD-10-CM | POA: Diagnosis present

## 2015-04-20 DIAGNOSIS — Z8619 Personal history of other infectious and parasitic diseases: Secondary | ICD-10-CM | POA: Insufficient documentation

## 2015-04-20 DIAGNOSIS — F419 Anxiety disorder, unspecified: Secondary | ICD-10-CM | POA: Insufficient documentation

## 2015-04-20 HISTORY — DX: Unspecified dementia, unspecified severity, without behavioral disturbance, psychotic disturbance, mood disturbance, and anxiety: F03.90

## 2015-04-20 LAB — I-STAT CHEM 8, ED
BUN: 11 mg/dL (ref 6–20)
CALCIUM ION: 1.21 mmol/L (ref 1.13–1.30)
Chloride: 100 mmol/L — ABNORMAL LOW (ref 101–111)
Creatinine, Ser: 1 mg/dL (ref 0.44–1.00)
GLUCOSE: 83 mg/dL (ref 65–99)
HCT: 45 % (ref 36.0–46.0)
HEMOGLOBIN: 15.3 g/dL — AB (ref 12.0–15.0)
Potassium: 3.8 mmol/L (ref 3.5–5.1)
Sodium: 144 mmol/L (ref 135–145)
TCO2: 31 mmol/L (ref 0–100)

## 2015-04-20 LAB — URINE MICROSCOPIC-ADD ON

## 2015-04-20 LAB — URINALYSIS, ROUTINE W REFLEX MICROSCOPIC
GLUCOSE, UA: NEGATIVE mg/dL
KETONES UR: NEGATIVE mg/dL
LEUKOCYTES UA: NEGATIVE
Nitrite: NEGATIVE
PROTEIN: 30 mg/dL — AB
Specific Gravity, Urine: >1.03 — ABNORMAL HIGH (ref 1.005–1.030)
pH: 6 (ref 5.0–8.0)

## 2015-04-20 NOTE — ED Notes (Signed)
Pt. Here for eval of high blood pressure. Pt. BP was taken at senior daycare and was 154/117 in Left arm and 159/107 in Right arm. Hx of hypertension.

## 2015-04-20 NOTE — ED Provider Notes (Signed)
CSN: [id number]     Arrival date & time 04/20/15  1226 History   First MD Initiated Contact with Patient 04/20/15 1255     Chief Complaint  Patient presents with  . Hypertension      Patient is a 81 y.o. female presenting with hypertension. The history is provided by a relative and the patient. The history is limited by the condition of the patient (Hx dementia).  Hypertension  Pt was seen at 1310.  Per pt's family: Pt's BP was taken at senior daycare today and "it was 154/117 and 159/107," so family bought pt to the ED for evaluation. Family states every month the senior daycare will randomly take BP's. Pt has hx of dementia and currently denies complaints. No reported falls, no focal motor weakness, no slurred speech, no facial droop.   Past Medical History  Diagnosis Date  . Heart palpitations     PAC's   . Hypertension   . Anxiety   . GERD (gastroesophageal reflux disease)   . Helicobacter pylori (H. pylori)     treated in 2009  . Bladder cancer ([place])   . Dementia    Past Surgical History  Procedure Laterality Date  . Cholecystectomy  1998  . Bladder surgey  2009  . Cataract extraction  2 years ago    both eyes  . Esophagogastroduodenoscopy  10/19/2011    Procedure: ESOPHAGOGASTRODUODENOSCOPY (EGD);  Surgeon: Rogene Houston, MD;  Location: [facility];  Service: Endoscopy;  Laterality: N/A;  730   Family History  Problem Relation Age of Onset  . Hypertension Mother   . Diabetes Mother   . Pneumonia Mother   . Diabetes Father   . Stroke Sister   . Stroke Brother   . Hypertension Daughter   . Obesity Daughter   . Diabetes Daughter    Social History  Substance Use Topics  . Smoking status: Never Smoker   . Smokeless tobacco: Never Used  . Alcohol Use: No    Review of Systems  Unable to perform ROS: Dementia      Allergies  Codeine and Sulfonamide derivatives  Home Medications   Prior to Admission medications   Medication Sig Start Date End Date  Taking? Authorizing Provider  Alum Hydroxide-Mag Carbonate (GAVISCON PO) Take 10 mLs by mouth daily as needed (acid reflux). Per the patient's daughter she takes 1 by mouth as needed.   Yes Historical Provider, MD  amLODipine-benazepril (LOTREL) 5-40 MG capsule TAKE 1 CAPSULE BY MOUTH ONCE A DAY. 04/14/15  Yes Fayrene Helper, MD  calcium-vitamin D (OSCAL 500/200 D-3) 500-200 MG-UNIT per tablet Take 1 tablet by mouth 2 (two) times daily. 12/14/13  Yes Rogene Houston, MD  cloNIDine (CATAPRES) 0.1 MG tablet Take 1 tablet (0.1 mg total) by mouth at bedtime. 03/07/15  Yes Fayrene Helper, MD  divalproex (DEPAKOTE ER) 250 MG 24 hr tablet Take 750 mg by mouth daily.   Yes Historical Provider, MD  EXELON 13.3 MG/24HR PT24 Apply one patch to skin daily, remove after 24 hours, and replace 02/18/15  Yes Fayrene Helper, MD  loratadine (CLARITIN) 10 MG tablet Take 1 tablet (10 mg total) by mouth daily. Patient taking differently: Take 10 mg by mouth daily as needed for allergies.  02/22/15  Yes Fayrene Helper, MD  pantoprazole (PROTONIX) 40 MG tablet Take 1 tablet (40 mg total) by mouth daily before breakfast. 12/10/14  Yes Rogene Houston, MD  polyethylene glycol powder (GLYCOLAX/MIRALAX) powder Take  8.5 g by mouth daily as needed. 09/21/14  Yes Rogene Houston, MD  polyvinyl alcohol (ARTIFICIAL TEARS) 1.4 % ophthalmic solution Place 1 drop into both eyes as needed. For dry eyes   Yes Historical Provider, MD  pravastatin (PRAVACHOL) 40 MG tablet TAKE 1 TABLET IN THE EVENING. 01/17/15  Yes Fayrene Helper, MD  temazepam (RESTORIL) 15 MG capsule Take 1 capsule (15 mg total) by mouth at bedtime as needed for sleep. 02/08/15  Yes Fayrene Helper, MD   BP 142/62 mmHg  Pulse 63  Temp(Src) 97.7 F (36.5 C) (Oral)  Resp 18  Ht 5\' 1"  (1.549 m)  Wt 130 lb (58.968 kg)  BMI 24.58 kg/m2  SpO2 100%   Filed Vitals:   04/20/15 1239 04/20/15 1240 04/20/15 1300 04/20/15 1524  BP: 142/62  114/94 140/70   Pulse:  63 53 63  Temp:      TempSrc:      Resp:   16 18  Height:      Weight:      SpO2:  100% 100% 100%    Physical Exam  1315: Physical examination:  Nursing notes reviewed; Vital signs and O2 SAT reviewed;  Constitutional: Well developed, Well nourished, Well hydrated, In no acute distress; Head:  Normocephalic, atraumatic; Eyes: EOMI, PERRL, No scleral icterus; ENMT: Mouth and pharynx normal, Mucous membranes moist; Neck: Supple, Full range of motion, No lymphadenopathy; Cardiovascular: Regular rate and rhythm, No gallop; Respiratory: Breath sounds clear & equal bilaterally, No wheezes.  Speaking full sentences with ease, Normal respiratory effort/excursion; Chest: Nontender, Movement normal; Abdomen: Soft, Nontender, Nondistended, Normal bowel sounds; Genitourinary: No CVA tenderness; Extremities: Pulses normal, No tenderness, No edema, No calf edema or asymmetry.; Neuro: Awake, alert, confused per baseline hx of dementia. Major CN grossly intact. No facial droop. Speech clear. Grips equal. Strength 5/5 equal bilat UE's and LE's. Pt moves all extremities spontaneously and to command without apparent gross focal motor deficits.; Skin: Color normal, Warm, Dry.   ED Course  Procedures (including critical care time) Labs Review   Imaging Review  I have personally reviewed and evaluated these images and lab results as part of my medical decision-making.   EKG Interpretation None      MDM  MDM Reviewed: previous chart, nursing note and vitals Reviewed previous: labs Interpretation: labs     Results for orders placed or performed during the hospital encounter of 04/20/15  Urinalysis, Routine w reflex microscopic  Result Value Ref Range   Color, Urine AMBER (A) YELLOW   APPearance CLEAR CLEAR   Specific Gravity, Urine >1.030 (H) 1.005 - 1.030   pH 6.0 5.0 - 8.0   Glucose, UA NEGATIVE NEGATIVE mg/dL   Hgb urine dipstick LARGE (A) NEGATIVE   Bilirubin Urine SMALL (A)  NEGATIVE   Ketones, ur NEGATIVE NEGATIVE mg/dL   Protein, ur 30 (A) NEGATIVE mg/dL   Nitrite NEGATIVE NEGATIVE   Leukocytes, UA NEGATIVE NEGATIVE  Urine microscopic-add on  Result Value Ref Range   Squamous Epithelial / LPF 0-5 (A) NONE SEEN   WBC, UA 0-5 0 - 5 WBC/hpf   RBC / HPF 6-30 0 - 5 RBC/hpf   Bacteria, UA MANY (A) NONE SEEN  I-stat Chem 8, ED  Result Value Ref Range   Sodium 144 135 - 145 mmol/L   Potassium 3.8 3.5 - 5.1 mmol/L   Chloride 100 (L) 101 - 111 mmol/L   BUN 11 6 - 20 mg/dL   Creatinine, Ser 1.00 0.44 -  1.00 mg/dL   Glucose, Bld 83 65 - 99 mg/dL   Calcium, Ion 1.21 1.13 - 1.30 mmol/L   TCO2 31 0 - 100 mmol/L   Hemoglobin 15.3 (H) 12.0 - 15.0 g/dL   HCT 45.0 36.0 - 46.0 %    1525:  BP stable while in the ED. No clear UTI on Udip; UC is pending. Pt and family would like to go home now. Dx and testing d/w pt and family.  Questions answered.  Verb understanding, agreeable to d/c home with outpt f/u.     Francine Graven, DO 04/23/15 [phone]

## 2015-04-20 NOTE — Discharge Instructions (Signed)
°Emergency Department Resource Guide °1) Find a Doctor and Pay Out of Pocket °Although you won't have to find out who is covered by your insurance plan, it is a good idea to ask around and get recommendations. You will then need to call the office and see if the doctor you have chosen will accept you as a new patient and what types of options they offer for patients who are self-pay. Some doctors offer discounts or will set up payment plans for their patients who do not have insurance, but you will need to ask so you aren't surprised when you get to your appointment. ° °2) Contact Your Local Health Department °Not all health departments have doctors that can see patients for sick visits, but many do, so it is worth a call to see if yours does. If you don't know where your local health department is, you can check in your phone book. The CDC also has a tool to help you locate your state's health department, and many state websites also have listings of all of their local health departments. ° °3) Find a Walk-in Clinic °If your illness is not likely to be very severe or complicated, you may want to try a walk in clinic. These are popping up all over the country in pharmacies, drugstores, and shopping centers. They're usually staffed by nurse practitioners or physician assistants that have been trained to treat common illnesses and complaints. They're usually fairly quick and inexpensive. However, if you have serious medical issues or chronic medical problems, these are probably not your best option. ° °No Primary Care Doctor: °- Call [facility] at  832-8000 - they can help you locate a primary care doctor that  accepts your insurance, provides certain services, etc. °- Physician Referral Service- 1-800-533-3463 ° °Chronic Pain Problems: °Organization         Address  Phone   Notes  °[facility]  (336) 297-2271 Patients need to be referred by their primary care doctor.  ° °Medication  Assistance: °Organization         Address  Phone   Notes  °[place] [facility] [address] °[place], [place] [address] (336) 641-8030 --Must be a resident of [place] °-- Must have NO insurance coverage whatsoever (no Medicaid/ Medicare, etc.) °-- The pt. MUST have a primary care doctor that directs their care regularly and follows them in the community °  °[facility]  (866) 331-1348   °[employer]  (888) 892-1162   ° °Agencies that provide inexpensive medical care: °Organization         Address  Phone   Notes  °[facility]  (336) 832-8035   °[facility]    (336) 832-7272   °[facility] [address] °[place],  [address] (336) 832-4777   °[facility] [address], °[place] (336) 271-4999   °[facility]    (336) 373-0678   °[facility]    (336) 272-1050   °[facility] ° [address], [place] Phone:  (336) 832-4444, Fax:  (336) 832-4440 Hours of Operation:  9 am - 6 pm, M-F.  Also accepts Medicaid/Medicare and self-pay.  °[facility] ° [address],  Phone: (336) 832-3150, Fax: (336) 832-3151. Hours of Operation:  8:30 am - 5:30 pm, M-F.  Also accepts Medicaid and self-pay.  °[facility] [place] [address], [place] Phone: (336) 878-6027   °[facility] [address], [place], [place] (336)723-1848, Ext. 123 Mondays & Thursdays: 7-9 AM.  First 15 patients are seen on a first come, first serve basis. °  ° °Medicaid-accepting [place] Providers: ° °Organization         Address  Phone   Notes  °[facility] [address], [place] (336) 641-2100 Also accepts self-pay patients.  °[facility] [address], [place] ° (336) 856-9996   °[facility] [address], [place]  (336) 288-8857   °[facility] [address] [place] [address], [place] (336) 299-7000   °Veita Bland [address], [place]  ° (336) 373-1557 Only accepts [place] Access Medicaid patients after they have their name applied to their card.  ° °Self-Pay (no insurance) in [place]: ° °Organization         Address  Phone   Notes  °Sickle Cell Patients, [facility] [address], [place] (336) 832-1970   °[facility] [address], [place] (336) 832-4400   °[facility] [place] [facility] ° [address], [place] (336) 992-4800   °[facility]/Dr. Osei-Bonsu ° [address] [place] [address], [place] or [address], [place] (336) 841-8500 Phone number for both [place] and [place] locations is the same.  °[facility] [address], [place] (336) 299-0000   °[facility] [address] [place] [address], [place] or [address] (336) 852-7530 °(336) 878-2260   °[facility] [address], [place] (336) 350-1642, phone; (336) 294-5005, fax Sees patients 1st and 3rd Saturday of every month.  Must not qualify for public or private insurance (i.e. Medicaid, Medicare, [place] Health Choice, Veterans' Benefits) • Household income should be no more than 200% of the poverty level •The clinic cannot treat you if you are pregnant or think you are pregnant • Sexually transmitted diseases are not treated at the clinic.  ° ° °Dental Care: °Organization         Address  Phone  Notes  °[place] [facility] [address], [place] (336) 641-6152 Accepts children up to age 21 who are enrolled in Medicaid or [place] Health Choice; pregnant women with a Medicaid card; and children who have applied for Medicaid or [place] Health Choice, but were declined, whose parents can pay a reduced fee at time of service.  °[place]  [facility] [place]  [address], [place] (336) 641-7733 Accepts children up to age 21 who are enrolled in Medicaid or [place] Health Choice; pregnant women with a Medicaid card; and children who have applied for Medicaid or [place] Health Choice, but were declined, whose parents can pay a reduced fee at time of service.  °[facility] ° [address],  (336) 641-4533 Patients are seen by appointment only. Walk-ins are not accepted. [facility] will see patients 18 years of age and older. °Monday - Tuesday (8am-5pm) °Most Wednesdays (8:30-5pm) °$30 per visit, cash only  °[facility] ° [address], [place] (336) 641-4533 Patients are seen by appointment only. Walk-ins are not accepted. [facility] will see patients 18 years of age and older. °One   Wednesday Evening (Monthly: Volunteer Based).  $30 per visit, cash only  °[facility]  (919) 537-3737 for adults; Children under age 4, call Graduate Pediatric Dentistry at ([phone]) [phone]. Children aged 4-14, please call (919) [phone] to request a pediatric application. ° Dental services are provided in all areas of dental care including fillings, crowns and bridges, complete and partial dentures, implants, gum treatment, root canals, and extractions. Preventive care is also provided. Treatment is provided to both adults and children. °Patients are selected via a lottery and there is often a waiting list. °  °[facility] [address], °[place] ° (336) 763-8833 [email or web address] °  °[facility] [address], [place], [place] (336)723-1848, Ext. 123 Second and Fourth Thursday of each month, opens at 6:30 AM; Clinic ends at 9 AM.  Patients are seen on a first-come first-served basis, and a limited number are seen during each clinic.  ° °[facility] ° [address], [place], [place] (336) 723-7904    Eligibility Requirements °You must have lived in [place], [place], or [place] counties for at least the last three months. °  You cannot be eligible for state or federal sponsored healthcare insurance, including Veterans Administration, Medicaid, or Medicare. °  You generally cannot be eligible for healthcare insurance through your employer.  °  How to apply: °Eligibility screenings are held every Tuesday and Wednesday afternoon from 1:00 pm until 4:00 pm. You do not need an appointment for the interview!  °[facility] [address], [place], [place] 336-631-2330   °[facility]  336-342-8273   °[facility]  336-703-3100   °[facility]  336-570-6415   ° °Behavioral Health Resources in the Community: °Intensive Outpatient Programs °Organization         Address  Phone  Notes  °[facility] [address], [place], [place] 336-878-6098   °[facility] [address], [place], [place] 336-832-9800   °[facility] [address], [place], [place] ° 336-882-2125   °[facility] [address],  °[place], [place] 1-800-853-5163 or 336-641-4981   °Substance Abuse Resources °Organization         Address  Phone  Notes  °[facility]  336-882-2125   °[facility]  336-784-9470   °[facility]  336-285-9073   °[facility]  336-845-3988   °Residential & Outpatient Substance Abuse Program  1-800-659-3381   °Psychological Services °Organization         Address  Phone  Notes  °[facility]  336- 832-9600   °[facility]  336- 378-7881   °[facility] [address], [place] 1-800-853-5163 or 336-641-4981   ° °Mobile Crisis Teams °Organization         Address  Phone  Notes  °[facility], Mobile Crisis Care Unit  1-877-626-1772   °Assertive °[facility] ° [address]  [place], [place] 336-834-9664   °Sharon DeEsch [address] °[place]  336-554-5454   ° °Self-Help/Support Groups °Organization         Address  Phone             Notes  °[facility]  - variety of support groups  336- 373-1402 Call for more information  °Narcotics Anonymous (NA), [facility] [address], °[place]   2 meetings at this location  ° °  Residential Treatment Programs Organization         Address  Phone  Notes  [facility] [address]    [place]  1-(331) 701-7356   [facility]  [address] [place] [id number], [place], [place]   [place], [place] [phone] Admissions: 8am-3pm M-F  [facility] [place] [address],    [place], [place] [phone]   [facility] [address] [place], [place], [place]   [facility] [address],  [place], [place]   [facility] Intensive Outpatient [address], [place], [place]   [facility] [address]  [place], [place] 1-731-569-8704 or 623-388-2117   [facility] [address], [place], [place] Accepts Medicaid  [facility] [address]  [place] 1-(217) 281-3750 Substance Abuse/Addiction Treatment   [facility]         Address  Phone  Notes  [facility]  [phone]   Domenic Schwab, PhD [address] [place], [place]   858-877-1483 or [phone]   [facility] [place], [place] (803)304-5665   [facility] [address], [place], [place] 787-427-9207 Insurance/Medicaid/sponsorship through [facility] and Families [address]                                    [place], [place] [phone] [place] [address], [place] (228) [phone]    Dr. Adele Schilder  ([phone]   [facility] Dept. 1) [address], [place] 2) [place] 3)  [address], [place] [phone]  (630)[phone]   [place] 251-233-6815 or 9130491850 (After Hours)      Take your usual prescriptions as previously directed.  Take your blood pressure once per day, a few times per week, either in the morning after you take your medicine(s) or in the evening before you go to bed.  Always sit quietly for at least 15 minutes before taking your blood pressure.  Keep a diary of your blood pressures to show your doctor at your follow up office visit.  Call your regular medical doctor tomorrow morning to schedule a follow up appointment in the next 2 to 3 days.  Return to the Emergency Department immediately sooner if worsening.

## 2015-04-22 LAB — URINE CULTURE

## 2015-05-04 ENCOUNTER — Other Ambulatory Visit: Payer: Self-pay | Admitting: Family Medicine

## 2015-05-12 ENCOUNTER — Encounter: Payer: Self-pay | Admitting: Family Medicine

## 2015-05-16 ENCOUNTER — Other Ambulatory Visit: Payer: Self-pay

## 2015-05-16 DIAGNOSIS — G4700 Insomnia, unspecified: Secondary | ICD-10-CM

## 2015-05-16 MED ORDER — TEMAZEPAM 15 MG PO CAPS: 15.0000 mg | ORAL_CAPSULE | Freq: Every evening | ORAL | Status: DC | PRN

## 2015-05-17 ENCOUNTER — Other Ambulatory Visit: Payer: Self-pay | Admitting: Family Medicine

## 2015-05-23 ENCOUNTER — Ambulatory Visit: Payer: Medicare PPO | Admitting: Family Medicine

## 2015-05-24 ENCOUNTER — Ambulatory Visit: Payer: Medicare PPO | Admitting: Family Medicine

## 2015-05-24 ENCOUNTER — Telehealth: Payer: Self-pay | Admitting: Family Medicine

## 2015-05-24 NOTE — Telephone Encounter (Addendum)
Spoke directly with daughter [name], states she has been seeing psychiatry and has been off her meds for a few days so she is combative. Now back on ? Some of the medication Has psych appt at end of the month States Mom has poor intake and is in incontinence underwear [facility] but not in the past several weeks  States Mom is c/o leg pain and she feels there is weakness and some instability requesting in home assessment, had asked about nurse practioner, stated this was not the norm, but i could see if thre are in home Providers available, She stated that is not what she wants, intends to bring her Mopm to office.   I did say that I would refer physical therapy to evaluate and treat leg weakness. O Pls call advanced see if thsi can be started before pt is evaluated in office and what time period she would need to actually be seen in the office so the service is covered, Call Angela back obnnce clarified, ensure she understands and offer  The necessary appt  PLS SPK WITH me bEFORE you call her so that I am clear on where we are.

## 2015-05-24 NOTE — Telephone Encounter (Signed)
Please advise 

## 2015-05-24 NOTE — Telephone Encounter (Signed)
[name] is calling stating that  [name] can't make appointment today, she is very combative and [name] is asking what would be the best alternative for her mother, [name]? She knows her mother is not able to make Drs visits now, please advise?

## 2015-05-26 ENCOUNTER — Encounter: Payer: Self-pay | Admitting: Family Medicine

## 2015-05-26 MED ORDER — TRAZODONE HCL 50 MG PO TABS: 50.0000 mg | ORAL_TABLET | Freq: Every day | ORAL | Status: DC

## 2015-05-26 NOTE — Telephone Encounter (Signed)
Called daughter and notified her of medication change.   Medicine sent to pharmacy.

## 2015-05-26 NOTE — Telephone Encounter (Signed)
Called and left message for manager at [facility] to return call.

## 2015-05-27 NOTE — Telephone Encounter (Signed)
Will call and speak with daughter re: physical therapy.  Patient must be seen within 30 days of start of services for Medicare to pay.

## 2015-05-27 NOTE — Telephone Encounter (Signed)
Daughter aware that this is being worked on

## 2015-05-31 NOTE — Telephone Encounter (Signed)
Daughter aware and appointment scheduled for next week.

## 2015-06-07 ENCOUNTER — Ambulatory Visit (INDEPENDENT_AMBULATORY_CARE_PROVIDER_SITE_OTHER): Payer: Medicare Other | Admitting: Family Medicine

## 2015-06-07 ENCOUNTER — Encounter: Payer: Self-pay | Admitting: Family Medicine

## 2015-06-07 VITALS — HR 80 | Resp 16 | Ht 61.0 in | Wt 105.0 lb

## 2015-06-07 DIAGNOSIS — F0281 Dementia in other diseases classified elsewhere with behavioral disturbance: Secondary | ICD-10-CM

## 2015-06-07 DIAGNOSIS — F02818 Dementia in other diseases classified elsewhere, unspecified severity, with other behavioral disturbance: Secondary | ICD-10-CM

## 2015-06-07 DIAGNOSIS — M159 Polyosteoarthritis, unspecified: Secondary | ICD-10-CM

## 2015-06-07 DIAGNOSIS — R531 Weakness: Secondary | ICD-10-CM

## 2015-06-07 DIAGNOSIS — R630 Anorexia: Secondary | ICD-10-CM

## 2015-06-07 DIAGNOSIS — G4700 Insomnia, unspecified: Secondary | ICD-10-CM

## 2015-06-07 DIAGNOSIS — Z9181 History of falling: Secondary | ICD-10-CM

## 2015-06-07 DIAGNOSIS — G301 Alzheimer's disease with late onset: Secondary | ICD-10-CM | POA: Diagnosis not present

## 2015-06-07 DIAGNOSIS — K5909 Other constipation: Secondary | ICD-10-CM

## 2015-06-07 DIAGNOSIS — K5900 Constipation, unspecified: Secondary | ICD-10-CM

## 2015-06-07 DIAGNOSIS — R296 Repeated falls: Secondary | ICD-10-CM

## 2015-06-07 MED ORDER — TRAZODONE HCL 50 MG PO TABS: 50.0000 mg | ORAL_TABLET | Freq: Every day | ORAL | Status: DC

## 2015-06-07 NOTE — Patient Instructions (Signed)
Annual wellness in August, call if you need me sooner  You are referred for in home physical therapy  We will see if we are able to make ensure supplements, and underwear accessible, pls keep in touch with nurse Loma Sousa about this  Thanks for choosing [facility], we consider it a privelige to serve you.  Be careful not to fall!  Fall Prevention in the Home  Falls can cause injuries. They can happen to people of all ages. There are many things you can do to make your home safe and to help prevent falls.  WHAT CAN I DO ON THE OUTSIDE OF MY HOME?  Regularly fix the edges of walkways and driveways and fix any cracks.  Remove anything that might make you trip as you walk through a door, such as a raised step or threshold.  Trim any bushes or trees on the path to your home.  Use bright outdoor lighting.  Clear any walking paths of anything that might make someone trip, such as rocks or tools.  Regularly check to see if handrails are loose or broken. Make sure that both sides of any steps have handrails.  Any raised decks and porches should have guardrails on the edges.  Have any leaves, snow, or ice cleared regularly.  Use sand or salt on walking paths during winter.  Clean up any spills in your garage right away. This includes oil or grease spills. WHAT CAN I DO IN THE BATHROOM?   Use night lights.  Install grab bars by the toilet and in the tub and shower. Do not use towel bars as grab bars.  Use non-skid mats or decals in the tub or shower.  If you need to sit down in the shower, use a plastic, non-slip stool.  Keep the floor dry. Clean up any water that spills on the floor as soon as it happens.  Remove soap buildup in the tub or shower regularly.  Attach bath mats securely with double-sided non-slip rug tape.  Do not have throw rugs and other things on the floor that can make you trip. WHAT CAN I DO IN THE BEDROOM?  Use night lights.  Make sure that  you have a light by your bed that is easy to reach.  Do not use any sheets or blankets that are too big for your bed. They should not hang down onto the floor.  Have a firm chair that has side arms. You can use this for support while you get dressed.  Do not have throw rugs and other things on the floor that can make you trip. WHAT CAN I DO IN THE KITCHEN?  Clean up any spills right away.  Avoid walking on wet floors.  Keep items that you use a lot in easy-to-reach places.  If you need to reach something above you, use a strong step stool that has a grab bar.  Keep electrical cords out of the way.  Do not use floor polish or wax that makes floors slippery. If you must use wax, use non-skid floor wax.  Do not have throw rugs and other things on the floor that can make you trip. WHAT CAN I DO WITH MY STAIRS?  Do not leave any items on the stairs.  Make sure that there are handrails on both sides of the stairs and use them. Fix handrails that are broken or loose. Make sure that handrails are as long as the stairways.  Check any carpeting to make  sure that it is firmly attached to the stairs. Fix any carpet that is loose or worn.  Avoid having throw rugs at the top or bottom of the stairs. If you do have throw rugs, attach them to the floor with carpet tape.  Make sure that you have a light switch at the top of the stairs and the bottom of the stairs. If you do not have them, ask someone to add them for you. WHAT ELSE CAN I DO TO HELP PREVENT FALLS?  Wear shoes that:  Do not have high heels.  Have rubber bottoms.  Are comfortable and fit you well.  Are closed at the toe. Do not wear sandals.  If you use a stepladder:  Make sure that it is fully opened. Do not climb a closed stepladder.  Make sure that both sides of the stepladder are locked into place.  Ask someone to hold it for you, if possible.  Clearly mark and make sure that you can see:  Any grab bars or  handrails.  First and last steps.  Where the edge of each step is.  Use tools that help you move around (mobility aids) if they are needed. These include:  Canes.  Walkers.  Scooters.  Crutches.  Turn on the lights when you go into a dark area. Replace any light bulbs as soon as they burn out.  Set up your furniture so you have a clear path. Avoid moving your furniture around.  If any of your floors are uneven, fix them.  If there are any pets around you, be aware of where they are.  Review your medicines with your doctor. Some medicines can make you feel dizzy. This can increase your chance of falling. Ask your doctor what other things that you can do to help prevent falls.   This information is not intended to replace advice given to you by your health care provider. Make sure you discuss any questions you have with your health care provider.   Document Released: 02/17/2009 Document Revised: 09/07/2014 Document Reviewed: 05/28/2014 Elsevier Interactive Patient Education [employer].

## 2015-06-09 ENCOUNTER — Encounter: Payer: Self-pay | Admitting: Family Medicine

## 2015-06-09 DIAGNOSIS — R630 Anorexia: Secondary | ICD-10-CM | POA: Insufficient documentation

## 2015-06-09 DIAGNOSIS — Z9181 History of falling: Secondary | ICD-10-CM | POA: Insufficient documentation

## 2015-06-09 DIAGNOSIS — R531 Weakness: Secondary | ICD-10-CM | POA: Insufficient documentation

## 2015-06-09 DIAGNOSIS — K5909 Other constipation: Secondary | ICD-10-CM | POA: Insufficient documentation

## 2015-06-09 NOTE — Assessment & Plan Note (Signed)
Muscle weakness and joint stiffness increase fall risk, refer for in home PT

## 2015-06-09 NOTE — Assessment & Plan Note (Signed)
Multifactorial, due to poor oral intake, with failure to thrive, and marked decrease in cognitive function Pt at high risk for falls. Noted to have right sided weakness when attempting to walk. Will benefit from in home PT for strengthening and gait training as able, referral  made

## 2015-06-09 NOTE — Assessment & Plan Note (Signed)
progressive dementia with increased weakness and weight loss, place pt at high fall risk Unilateral weakness noted by family, Pt in home for evaluation and strengthening

## 2015-06-09 NOTE — Assessment & Plan Note (Signed)
- 

## 2015-06-09 NOTE — Assessment & Plan Note (Addendum)
Did not fill/collect trazodone, states pharmacy told her it was not sent Sleep hygiene reviewed and written information offered also. Prescription sent for  medication needed.

## 2015-06-09 NOTE — Progress Notes (Signed)
   Subjective:    Patient ID: [name], female    DOB: 06-25-1933, 82 y.o.   MRN: [id number]  HPI  History provided by caregivers, [name] is incapable Patient brought in today with her 2 daughters who are her primary caregivers. They are both concerned about her ongoing deterioration as far as her ability to function, she at times has been aggressive, but currently that seems to have improved since psych eval and assistance Her poor appetite, weakness, and high fall risk are main concerns voiced. It has been difficult to get [facility], however, when there she seems to do all right Help with ensure supplements , which she enjoys , will also be beneficial, as well as incontinence supplies, will attempt to assist as best able, will need to follow through on community resources    Review of Systems See HPI Denies recent fever or chills. Denies  nasal congestion,  Denies chest congestion, productive cough or wheezing. Denies PND , orthopnea s and leg swelling Denies abdominal pain, nausea, vomiting,diarrhea .   Denies dysuria, frequency,  Denies  seizures,  Denies skin break down or rash.        Objective:   Physical Exam BP   Pulse 80  Resp 16  Ht 5\' 1"  (1.549 m)  Wt 105 lb (47.628 kg)  BMI 19.85 kg/m2  Chronically ill appearing, under nourished  elderly lady in no c/p distress, incapable of providing history, due to severe dementia HEENT: No facial asymmetry, bitemporal wasting EOMI,   oropharynx pink and moist.  Neck decreased ROM, no JVD, no mass.  Chest: Clear to auscultation bilaterally.  CVS: S1, S2 no murmurs, no S3.Regular rate.  ABD: Soft non tender.   Ext: No edema  MS: decreased te ROM spine, shoulders, hips and knees.  Skin: Intact, no ulcerations or rash noted.  Psych: Flat  affect. Severe dementia mildly anxious not  depressed appearing.  CNS: Grade 4  power,   And decreased tone throughout        Assessment & Plan:    Alzheimer's disease Significant deterioration since last visit over 6 months ago. Disease is clearly progressing. Had benefit of psych eval and assistance with medication, next f/u is in 6 months if family wishes,I am told that I am being asked to continue medications , and will send   Insomnia Did not fill/collect trazodone, states pharmacy told her it was not sent Sleep hygiene reviewed and written information offered also. Prescription sent for  medication needed.   Generalized weakness Multifactorial, due to poor oral intake, with failure to thrive, and marked decrease in cognitive function Pt at high risk for falls. Noted to have right sided weakness when attempting to walk. Will benefit from in home PT for strengthening and gait training as able, referral  made  Generalized osteoarthritis of multiple sites Muscle weakness and joint stiffness increase fall risk, refer for in home PT  Anorexia Daughters who care for pt report increased difficulty in feeding her, she refuses food more often Does like ensure, and attempt will be made to see if can qualify for assistance with the supplements  At high risk for falls progressive dementia with increased weakness and weight loss, place pt at high fall risk Unilateral weakness noted by family, Pt in home for evaluation and strengthening  Constipation, chronic continue daily miralax

## 2015-06-09 NOTE — Assessment & Plan Note (Signed)
Significant deterioration since last visit over 6 months ago. Disease is clearly progressing. Had benefit of psych eval and assistance with medication, next f/u is in 6 months if family wishes,I am told that I am being asked to continue medications , and will send

## 2015-06-09 NOTE — Assessment & Plan Note (Signed)
Daughters who care for pt report increased difficulty in feeding her, she refuses food more often Does like ensure, and attempt will be made to see if can qualify for assistance with the supplements

## 2015-06-15 ENCOUNTER — Other Ambulatory Visit (HOSPITAL_COMMUNITY): Payer: Self-pay | Admitting: Psychiatry

## 2015-06-15 ENCOUNTER — Encounter: Payer: Self-pay | Admitting: Family Medicine

## 2015-06-15 NOTE — Telephone Encounter (Signed)
Spoke with daughter and she states that only 1 bed rail is needed.   Daughter also aware of advice with Trazadone and will try 100mg  tonight and call back tomorrow if this does not help.  Script for DME sent to [facility]

## 2015-06-16 ENCOUNTER — Other Ambulatory Visit: Payer: Self-pay

## 2015-06-16 ENCOUNTER — Other Ambulatory Visit: Payer: Self-pay | Admitting: Family Medicine

## 2015-06-16 ENCOUNTER — Encounter: Payer: Self-pay | Admitting: Family Medicine

## 2015-06-16 MED ORDER — TEMAZEPAM 15 MG PO CAPS: 15.0000 mg | ORAL_CAPSULE | Freq: Every evening | ORAL | Status: DC | PRN

## 2015-06-16 NOTE — Telephone Encounter (Signed)
Daughter aware and happy with change.

## 2015-06-17 ENCOUNTER — Emergency Department (HOSPITAL_COMMUNITY): Payer: Medicare Other

## 2015-06-17 ENCOUNTER — Emergency Department (HOSPITAL_COMMUNITY)
Admission: EM | Admit: 2015-06-17 | Discharge: 2015-06-18 | Disposition: A | Discharge status: Home / Self Care | Payer: Medicare Other | Attending: Emergency Medicine | Admitting: Emergency Medicine

## 2015-06-17 ENCOUNTER — Encounter (HOSPITAL_COMMUNITY): Payer: Self-pay

## 2015-06-17 DIAGNOSIS — Z79899 Other long term (current) drug therapy: Secondary | ICD-10-CM | POA: Diagnosis not present

## 2015-06-17 DIAGNOSIS — F419 Anxiety disorder, unspecified: Secondary | ICD-10-CM | POA: Diagnosis not present

## 2015-06-17 DIAGNOSIS — K219 Gastro-esophageal reflux disease without esophagitis: Secondary | ICD-10-CM | POA: Insufficient documentation

## 2015-06-17 DIAGNOSIS — I10 Essential (primary) hypertension: Secondary | ICD-10-CM | POA: Diagnosis not present

## 2015-06-17 DIAGNOSIS — Z8619 Personal history of other infectious and parasitic diseases: Secondary | ICD-10-CM | POA: Diagnosis not present

## 2015-06-17 DIAGNOSIS — F0390 Unspecified dementia without behavioral disturbance: Secondary | ICD-10-CM | POA: Diagnosis not present

## 2015-06-17 DIAGNOSIS — R569 Unspecified convulsions: Secondary | ICD-10-CM | POA: Insufficient documentation

## 2015-06-17 DIAGNOSIS — Z8551 Personal history of malignant neoplasm of bladder: Secondary | ICD-10-CM | POA: Diagnosis not present

## 2015-06-17 DIAGNOSIS — R251 Tremor, unspecified: Secondary | ICD-10-CM | POA: Diagnosis present

## 2015-06-17 DIAGNOSIS — F13231 Sedative, hypnotic or anxiolytic dependence with withdrawal delirium: Secondary | ICD-10-CM | POA: Insufficient documentation

## 2015-06-17 DIAGNOSIS — R000 Tachycardia, unspecified: Secondary | ICD-10-CM | POA: Diagnosis not present

## 2015-06-17 DIAGNOSIS — F13931 Sedative, hypnotic or anxiolytic use, unspecified with withdrawal delirium: Secondary | ICD-10-CM

## 2015-06-17 LAB — COMPREHENSIVE METABOLIC PANEL
ALBUMIN: 3.2 g/dL — AB (ref 3.5–5.0)
ALK PHOS: 58 U/L (ref 38–126)
ALT: 18 U/L (ref 14–54)
AST: 48 U/L — AB (ref 15–41)
Anion gap: 14 (ref 5–15)
BILIRUBIN TOTAL: 0.9 mg/dL (ref 0.3–1.2)
BUN: 13 mg/dL (ref 6–20)
CO2: 25 mmol/L (ref 22–32)
CREATININE: 1.01 mg/dL — AB (ref 0.44–1.00)
Calcium: 9.3 mg/dL (ref 8.9–10.3)
Chloride: 102 mmol/L (ref 101–111)
GFR calc Af Amer: 59 mL/min — ABNORMAL LOW (ref >60)
GFR, EST NON AFRICAN AMERICAN: 51 mL/min — AB (ref >60)
GLUCOSE: 123 mg/dL — AB (ref 65–99)
POTASSIUM: 4.4 mmol/L (ref 3.5–5.1)
Sodium: 141 mmol/L (ref 135–145)
TOTAL PROTEIN: 6.3 g/dL — AB (ref 6.5–8.1)

## 2015-06-17 LAB — CBC WITH DIFFERENTIAL/PLATELET
BASOS ABS: 0 10*3/uL (ref 0.0–0.1)
BASOS PCT: 0 %
Eosinophils Absolute: 0 10*3/uL (ref 0.0–0.7)
Eosinophils Relative: 0 %
HEMATOCRIT: 41.8 % (ref 36.0–46.0)
HEMOGLOBIN: 13.6 g/dL (ref 12.0–15.0)
LYMPHS PCT: 26 %
Lymphs Abs: 1.9 10*3/uL (ref 0.7–4.0)
MCH: 32.9 pg (ref 26.0–34.0)
MCHC: 32.5 g/dL (ref 30.0–36.0)
MCV: 101 fL — AB (ref 78.0–100.0)
Monocytes Absolute: 0.7 10*3/uL (ref 0.1–1.0)
Monocytes Relative: 10 %
NEUTROS ABS: 4.6 10*3/uL (ref 1.7–7.7)
NEUTROS PCT: 64 %
Platelets: 150 10*3/uL (ref 150–400)
RBC: 4.14 MIL/uL (ref 3.87–5.11)
RDW: 14.9 % (ref 11.5–15.5)
WBC: 7.2 10*3/uL (ref 4.0–10.5)

## 2015-06-17 LAB — URINALYSIS, ROUTINE W REFLEX MICROSCOPIC
BILIRUBIN URINE: NEGATIVE
GLUCOSE, UA: NEGATIVE mg/dL
Leukocytes, UA: NEGATIVE
Nitrite: NEGATIVE
Specific Gravity, Urine: 1.02 (ref 1.005–1.030)
pH: 6.5 (ref 5.0–8.0)

## 2015-06-17 LAB — URINE MICROSCOPIC-ADD ON

## 2015-06-17 LAB — RAPID URINE DRUG SCREEN, HOSP PERFORMED
AMPHETAMINES: NOT DETECTED
Barbiturates: NOT DETECTED
Benzodiazepines: POSITIVE — AB
Cocaine: NOT DETECTED
Opiates: NOT DETECTED
TETRAHYDROCANNABINOL: NOT DETECTED

## 2015-06-17 LAB — VALPROIC ACID LEVEL: VALPROIC ACID LVL: 20 ug/mL — AB (ref 50.0–100.0)

## 2015-06-17 MED ORDER — SODIUM CHLORIDE 0.9 % IV BOLUS (SEPSIS)
500.0000 mL | Freq: Once | INTRAVENOUS | Status: AC
  Administered 2015-06-17: 500 mL via INTRAVENOUS

## 2015-06-17 MED ORDER — LORAZEPAM 2 MG/ML IJ SOLN
0.5000 mg | Freq: Once | INTRAMUSCULAR | Status: AC
  Administered 2015-06-17: 0.5 mg via INTRAVENOUS
  Filled 2015-06-17: qty 1

## 2015-06-17 NOTE — Discharge Instructions (Signed)
Continue to take the patient's Restoril nightly for sleep. Do not abruptly discontinue this. Follow-up with the patient's primary doctor. Return immediately for seizure-like activity, increased agitation or any concerns.  Benzodiazepine Withdrawal  Benzodiazepines are a group of drugs that are prescribed for both short-term and long-term treatment of a variety of medical conditions. For some of these conditions, such as seizures and sudden and severe muscle spasms, they are used only for a few hours or a few days. For other conditions, such as anxiety, sleep problems, or frequent muscle spasms or to help prevent seizures, they are used for an extended period, usually weeks or months. Benzodiazepines work by changing the way your brain functions. Normally, chemicals in your brain called neurotransmitters send messages between your brain cells. The neurotransmitter that benzodiazepines affect is called gamma-aminobutyric acid (GABA). GABA sends out messages that have a calming effect on many of the functions of your brain. Benzodiazepines make these messages stronger and increase this calming effect. Short-term use of benzodiazepines usually does not cause problems when you stop taking the drugs. However, if you take benzodiazepines for a long time, your body can adjust to the drug and require more of it to produce the same effect (drug tolerance). Eventually, you can develop physical dependence on benzodiazepines, which is when you experience negative effects if your dosage of benzodiazepines is reduced or stopped too quickly. These negative effects are called symptoms of withdrawal. SYMPTOMS Symptoms of withdrawal may begin anytime within the first 10 days after you stop taking the benzodiazepine. They can last from several weeks up to a few months but usually are the worst between the first 10 to 14 days.  The actual symptoms also vary, depending on the type of benzodiazepine you take. Possible symptoms  include:  Anxiety.  Excitability.  Irritability.  Depression.  Mood swings.  Trouble sleeping.  Confusion.  Uncontrollable shaking (tremors).  Muscle weakness.  Seizures. DIAGNOSIS To diagnose benzodiazepine withdrawal, your caregiver will examine you for certain signs, such as:  Rapid heartbeat.  Rapid breathing.  Tremors.  High blood pressure.  Fever.  Mood changes. Your caregiver also may ask the following questions about your use of benzodiazepines:  What type of benzodiazepine did you take?  How much did you take each day?  How long did you take the drug?  When was the last time you took the drug?  Do you take any other drugs?  Have you had alcohol recently?  Have you had a seizure recently?  Have you lost consciousness recently?  Have you had trouble remembering recent events?  Have you had a recent increase in anxiety, irritability, or trouble sleeping? A drug test also may be administered. TREATMENT The treatment for benzodiazepine withdrawal can vary, depending on the type and severity of your symptoms, what type of benzodiazepine you have been taking, and how long you have been taking the benzodiazepine. Sometimes it is necessary for you to be treated in a hospital, especially if you are at risk of seizures.  Often, treatment includes a prescription for a long-acting benzodiazepine, the dosage of which is reduced slowly over a long period. This period could be several weeks or months. Eventually, your dosage will be reduced to a point that you can stop taking the drug, without experiencing withdrawal symptoms. This is called tapered withdrawal. Occasionally, minor symptoms of withdrawal continue for a few days or weeks after you have completed a tapered withdrawal. SEEK IMMEDIATE MEDICAL CARE IF:  You have a seizure.  You develop a craving for drugs or alcohol.  You begin to experience symptoms of withdrawal during your tapered  withdrawal.  You become very confused.  You lose consciousness.  You have trouble breathing.  You think about hurting yourself or someone else.   This information is not intended to replace advice given to you by your health care provider. Make sure you discuss any questions you have with your health care provider.   Document Released: 04/12/2011 Document Revised: 05/14/2014 Document Reviewed: 10/13/2014 Elsevier Interactive Patient Education 2016 [place] with epilepsy have times when they shake and jerk uncontrollably (seizures). This happens when there is a sudden change in brain function. Epilepsy may have many possible causes. Anything that disturbs the normal pattern of brain cell activity can lead to seizures. HOME CARE   Follow your doctor's instructions about driving and safety during normal activities.  Get enough sleep.  Only take medicine as told by your doctor.  Avoid things that you know can cause you to have seizures (triggers).  Write down when your seizures happen and what you remember about each seizure. Write down anything you think may have caused the seizure to happen.  Tell the people you live and work with that you have seizures. Make sure they know how to help you. They should:  Cushion your head and body.  Turn you on your side.  Not restrain you.  Not place anything inside your mouth.  Call for local emergency medical help if there is any question about what has happened.  Keep all follow-up visits with your doctor. This is very important. GET HELP IF:  You get an infection or start to feel sick. You may have more seizures when you are sick.  You are having seizures more often.  Your seizure pattern is changing. GET HELP RIGHT AWAY IF:   A seizure does not stop after a few seconds or minutes.  A seizure causes you to have trouble breathing.  A seizure gives you a very bad headache.  A seizure makes you unable  to speak or use a part of your body.   This information is not intended to replace advice given to you by your health care provider. Make sure you discuss any questions you have with your health care provider.   Document Released: 02/18/2009 Document Revised: 02/11/2013 Document Reviewed: 12/03/2012 Elsevier Interactive Patient Education [employer].

## 2015-06-17 NOTE — ED Notes (Addendum)
EMS reports was called out to pt's house because pt started shaking while family was giving pt a bath.   EMS says when they arrived pt was shaking but when he shined a light in pt's eyes, the shaking stopped.   EMS  says they did not see any chill bumps on pt like she was cold.   EMS described the shaking as someone with parkinson's.  Pt combative at times.  PT has dementia.  VS:  bp 132/56, hr 74, cbg 131.  Pt vomited x 1.  Recently had some medication changes but family not here yet to elaborate.

## 2015-06-17 NOTE — ED Provider Notes (Signed)
CSN: [id number]     Arrival date & time 06/17/15  1806 History   First MD Initiated Contact with Patient 06/17/15 1839     Chief Complaint  Patient presents with  . shaking      (Consider location/radiation/quality/duration/timing/severity/associated sxs/prior Treatment) HPI Patient presents with shaking episode via EMS. She has advanced dementia and is unable to contribute history. Daughter states that she was bathing the patient when she came rigid, eyes rolled back and had convulsive movements. This lasted less than 1 minute. Afterwards she was lethargic but since returned to her baseline mental status. No trauma. Patient has no history of seizures. Per daughter the patient has been on 15 mg of temazepam every evening for sleep for a long period of time. She was taken off of this on Tuesday and started on trazodone. Patient was agitated both Tuesday and Wednesday night and unable to sleep despite trazodone. Family then gave a 30 mg dose to the patient Thursday evening. Patient slept well through the night and all day. Family got patient from bed around 4:30 today her. She had one episode of vomiting and then had a shaking episode. Patient has had ongoing tremors since waking this afternoon. Level V caveat due to dementia. Past Medical History  Diagnosis Date  . Heart palpitations     PAC's   . Hypertension   . Anxiety   . GERD (gastroesophageal reflux disease)   . Helicobacter pylori (H. pylori)     treated in 2009  . Bladder cancer ([place])   . Dementia    Past Surgical History  Procedure Laterality Date  . Cholecystectomy  1998  . Bladder surgey  2009  . Cataract extraction  2 years ago    both eyes  . Esophagogastroduodenoscopy  10/19/2011    Procedure: ESOPHAGOGASTRODUODENOSCOPY (EGD);  Surgeon: Rogene Houston, MD;  Location: [facility];  Service: Endoscopy;  Laterality: N/A;  730   Family History  Problem Relation Age of Onset  . Hypertension Mother   . Diabetes Mother    . Pneumonia Mother   . Diabetes Father   . Stroke Sister   . Stroke Brother   . Hypertension Daughter   . Obesity Daughter   . Diabetes Daughter    Social History  Substance Use Topics  . Smoking status: Never Smoker   . Smokeless tobacco: Never Used  . Alcohol Use: No   OB History    No data available     Review of Systems  Unable to perform ROS: Dementia  Neurological: Positive for tremors and seizures.      Allergies  Codeine and Sulfonamide derivatives  Home Medications   Prior to Admission medications   Medication Sig Start Date End Date Taking? Authorizing Provider  Alum Hydroxide-Mag Carbonate (GAVISCON PO) Take 10 mLs by mouth daily as needed (acid reflux). Per the patient's daughter she takes 1 by mouth as needed.   Yes Historical Provider, MD  amLODipine-benazepril (LOTREL) 5-40 MG capsule TAKE 1 CAPSULE BY MOUTH ONCE A DAY. 04/14/15  Yes Fayrene Helper, MD  cloNIDine (CATAPRES) 0.1 MG tablet TAKE 1 TABLET BY MOUTH AT BEDTIME. 05/06/15  Yes Fayrene Helper, MD  divalproex (DEPAKOTE ER) 250 MG 24 hr tablet Take 750 mg by mouth daily.   Yes Historical Provider, MD  EXELON 13.3 MG/24HR PT24 Apply one patch to skin daily, remove after 24 hours, and replace 02/18/15  Yes Fayrene Helper, MD  pantoprazole (PROTONIX) 40 MG tablet Take 1  tablet (40 mg total) by mouth daily before breakfast. 12/10/14  Yes Rogene Houston, MD  polyethylene glycol powder (GLYCOLAX/MIRALAX) powder Take 8.5 g by mouth daily as needed. Patient taking differently: Take 8.5 g by mouth daily as needed for mild constipation or moderate constipation.  09/21/14  Yes Rogene Houston, MD  pravastatin (PRAVACHOL) 40 MG tablet TAKE 1 TABLET IN THE EVENING. 05/18/15  Yes Fayrene Helper, MD  temazepam (RESTORIL) 15 MG capsule Take 1 capsule (15 mg total) by mouth at bedtime as needed for sleep. Patient taking differently: Take 30 mg by mouth at bedtime as needed for sleep.  06/16/15  Yes Fayrene Helper, MD   BP 143/44 mmHg  Pulse 52  Temp(Src) 98.2 F (36.8 C) (Oral)  Resp 14  Ht 5\' 2"  (1.575 m)  Wt 105 lb (47.628 kg)  BMI 19.20 kg/m2  SpO2 98% Physical Exam  Constitutional: She appears well-developed and well-nourished. No distress.  Chronically ill-appearing  HENT:  Head: Normocephalic and atraumatic.  Mouth/Throat: Oropharynx is clear and moist. No oropharyngeal exudate.  No intraoral trauma  Eyes: EOM are normal. Pupils are equal, round, and reactive to light.  Neck: Normal range of motion. Neck supple.  No meningismus  Cardiovascular: Normal rate and regular rhythm.   Pulmonary/Chest: Effort normal and breath sounds normal. No respiratory distress. She has no wheezes. She has no rales.  Abdominal: Soft. Bowel sounds are normal. She exhibits no distension and no mass. There is no tenderness. There is no rebound and no guarding.  Musculoskeletal: Normal range of motion. She exhibits no edema or tenderness.  Neurological: She is alert.  Moving all extremities. Intermittently following commands. Fine tremor to bilateral upper extremities  Skin: Skin is warm and dry. No rash noted. No erythema.  Psychiatric: She has a normal mood and affect. Her behavior is normal.  Nursing note and vitals reviewed.   ED Course  Procedures (including critical care time) Labs Review Labs Reviewed  CBC WITH DIFFERENTIAL/PLATELET - Abnormal; Notable for the following:    MCV 101.0 (*)    All other components within normal limits  COMPREHENSIVE METABOLIC PANEL - Abnormal; Notable for the following:    Glucose, Bld 123 (*)    Creatinine, Ser 1.01 (*)    Total Protein 6.3 (*)    Albumin 3.2 (*)    AST 48 (*)    GFR calc non Af Amer 51 (*)    GFR calc Af Amer 59 (*)    All other components within normal limits  URINALYSIS, ROUTINE W REFLEX MICROSCOPIC (NOT AT [facility]) - Abnormal; Notable for the following:    Hgb urine dipstick MODERATE (*)    Ketones, ur TRACE (*)    Protein, ur  TRACE (*)    All other components within normal limits  URINE RAPID DRUG SCREEN, HOSP PERFORMED - Abnormal; Notable for the following:    Benzodiazepines POSITIVE (*)    All other components within normal limits  VALPROIC ACID LEVEL - Abnormal; Notable for the following:    Valproic Acid Lvl 20 (*)    All other components within normal limits  URINE MICROSCOPIC-ADD ON - Abnormal; Notable for the following:    Squamous Epithelial / LPF 0-5 (*)    Bacteria, UA RARE (*)    All other components within normal limits    Imaging Review Ct Head Wo Contrast  06/17/2015  CLINICAL DATA:  82 year old female with altered mental status and possible seizure. EXAM: CT HEAD  WITHOUT CONTRAST TECHNIQUE: Contiguous axial images were obtained from the base of the skull through the vertex without intravenous contrast. COMPARISON:  09/30/2013 and prior exams. FINDINGS: Atrophy, remote left occipital infarct and chronic small-vessel white matter ischemic changes again noted. No acute intracranial abnormalities are identified, including mass lesion or mass effect, hydrocephalus, extra-axial fluid collection, midline shift, hemorrhage, or acute infarction. The visualized bony calvarium is unremarkable. IMPRESSION: No evidence of acute intracranial abnormality. Atrophy, remote left occipital infarct and chronic small-vessel white matter ischemic changes. Electronically Signed   By: Margarette Canada M.D.   On: 06/17/2015 21:13   I have personally reviewed and evaluated these images and lab results as part of my medical decision-making.   EKG Interpretation   Date/Time:  Friday June 17 2015 20:09:59 EST Ventricular Rate:  58 PR Interval:  145 QRS Duration: 102 QT Interval:  417 QTC Calculation: 409 R Axis:   39 Text Interpretation:  Sinus rhythm Abnormal R-wave progression, early  transition Confirmed by Ignacia Gentzler  MD, Lot Medford ([id number]) on 06/17/2015 11:09:53  PM      MDM   Final diagnoses:  Withdrawal from  benzodiazepine, with delirium ([place])  New onset seizure ([place])    Suspect seizure may be due to benzodiazepine withdrawal. Workup and given low-dose Ativan. Tachycardia and tremor have abated. Discussed with family the need to continue benzodiazepine and not abruptly discontinue them. She will follow-up with her primary physician. Return precautions have been given.  Julianne Rice, MD 06/17/15 [phone]

## 2015-06-17 NOTE — ED Notes (Signed)
In and out catheter per MD orders. Urine is dark and sent to lab

## 2015-06-20 ENCOUNTER — Other Ambulatory Visit (HOSPITAL_COMMUNITY): Payer: Self-pay | Admitting: Psychiatry

## 2015-06-21 ENCOUNTER — Telehealth: Payer: Self-pay | Admitting: Family Medicine

## 2015-06-21 NOTE — Telephone Encounter (Signed)
Pls contact daughter , let he rknow that I am aware that her Mom was in ED with seizure which is new and  That CT scan of brain showed no underlying cause I hope that she  is doing well now and no more recurrence with good / better sleep back on her sleep med. Offer neuro eval if family wishes then enter referral for new seizure activity pls

## 2015-06-21 NOTE — Telephone Encounter (Signed)
My Chart message sent

## 2015-06-28 DIAGNOSIS — F0281 Dementia in other diseases classified elsewhere with behavioral disturbance: Secondary | ICD-10-CM | POA: Diagnosis not present

## 2015-06-28 DIAGNOSIS — R269 Unspecified abnormalities of gait and mobility: Secondary | ICD-10-CM | POA: Diagnosis not present

## 2015-06-28 DIAGNOSIS — G301 Alzheimer's disease with late onset: Secondary | ICD-10-CM | POA: Diagnosis not present

## 2015-06-28 DIAGNOSIS — M6281 Muscle weakness (generalized): Secondary | ICD-10-CM | POA: Diagnosis not present

## 2015-07-06 ENCOUNTER — Encounter: Payer: Self-pay | Admitting: Family Medicine

## 2015-07-07 ENCOUNTER — Other Ambulatory Visit: Payer: Self-pay | Admitting: Family Medicine

## 2015-07-07 MED ORDER — TEMAZEPAM 30 MG PO CAPS: 30.0000 mg | ORAL_CAPSULE | Freq: Every evening | ORAL | Status: DC | PRN

## 2015-08-12 ENCOUNTER — Other Ambulatory Visit (INDEPENDENT_AMBULATORY_CARE_PROVIDER_SITE_OTHER): Payer: Self-pay | Admitting: Internal Medicine

## 2015-08-24 ENCOUNTER — Encounter: Payer: Self-pay | Admitting: Family Medicine

## 2015-08-24 ENCOUNTER — Other Ambulatory Visit: Payer: Self-pay

## 2015-08-24 MED ORDER — MENTHOL-ZINC OXIDE 0.45-20 % EX OINT: TOPICAL_OINTMENT | CUTANEOUS | Status: DC

## 2015-08-29 ENCOUNTER — Other Ambulatory Visit: Payer: Self-pay

## 2015-08-29 MED ORDER — MENTHOL-ZINC OXIDE 0.45-20 % EX OINT: TOPICAL_OINTMENT | CUTANEOUS | Status: DC

## 2015-09-01 ENCOUNTER — Other Ambulatory Visit: Payer: Self-pay | Admitting: Family Medicine

## 2015-09-02 ENCOUNTER — Encounter (INDEPENDENT_AMBULATORY_CARE_PROVIDER_SITE_OTHER): Payer: Self-pay | Admitting: *Deleted

## 2015-09-15 ENCOUNTER — Other Ambulatory Visit: Payer: Self-pay | Admitting: Family Medicine

## 2015-09-20 ENCOUNTER — Encounter: Payer: Self-pay | Admitting: Family Medicine

## 2015-09-27 ENCOUNTER — Telehealth: Payer: Self-pay | Admitting: Family Medicine

## 2015-09-27 NOTE — Telephone Encounter (Signed)
Is it ok to send prescription for wheelchair?

## 2015-09-27 NOTE — Telephone Encounter (Signed)
Script for wheelchair written, direct contact with daughter to express concern and support

## 2015-10-14 ENCOUNTER — Encounter: Payer: Self-pay | Admitting: Family Medicine

## 2015-10-17 ENCOUNTER — Other Ambulatory Visit: Payer: Self-pay | Admitting: Family Medicine

## 2015-10-17 MED ORDER — MEGESTROL ACETATE 40 MG PO TABS: 40.0000 mg | ORAL_TABLET | Freq: Every day | ORAL | Status: DC

## 2015-10-24 ENCOUNTER — Encounter: Payer: Self-pay | Admitting: Family Medicine

## 2015-11-11 ENCOUNTER — Encounter (HOSPITAL_COMMUNITY): Payer: Self-pay | Admitting: Emergency Medicine

## 2015-11-11 ENCOUNTER — Emergency Department (HOSPITAL_COMMUNITY): Payer: Medicare Other

## 2015-11-11 ENCOUNTER — Inpatient Hospital Stay (HOSPITAL_COMMUNITY)
Admission: EM | Admit: 2015-11-11 | Discharge: 2015-11-15 | DRG: 871 | Disposition: A | Discharge status: Hospice | Payer: Medicare Other | Attending: Family Medicine | Admitting: Family Medicine

## 2015-11-11 DIAGNOSIS — D696 Thrombocytopenia, unspecified: Secondary | ICD-10-CM | POA: Diagnosis present

## 2015-11-11 DIAGNOSIS — Z8551 Personal history of malignant neoplasm of bladder: Secondary | ICD-10-CM | POA: Diagnosis not present

## 2015-11-11 DIAGNOSIS — I959 Hypotension, unspecified: Secondary | ICD-10-CM | POA: Diagnosis present

## 2015-11-11 DIAGNOSIS — K219 Gastro-esophageal reflux disease without esophagitis: Secondary | ICD-10-CM | POA: Diagnosis present

## 2015-11-11 DIAGNOSIS — R627 Adult failure to thrive: Secondary | ICD-10-CM | POA: Diagnosis not present

## 2015-11-11 DIAGNOSIS — Z79899 Other long term (current) drug therapy: Secondary | ICD-10-CM

## 2015-11-11 DIAGNOSIS — N179 Acute kidney failure, unspecified: Secondary | ICD-10-CM | POA: Diagnosis present

## 2015-11-11 DIAGNOSIS — R4182 Altered mental status, unspecified: Secondary | ICD-10-CM | POA: Diagnosis not present

## 2015-11-11 DIAGNOSIS — E538 Deficiency of other specified B group vitamins: Secondary | ICD-10-CM | POA: Diagnosis present

## 2015-11-11 DIAGNOSIS — F419 Anxiety disorder, unspecified: Secondary | ICD-10-CM | POA: Diagnosis present

## 2015-11-11 DIAGNOSIS — I10 Essential (primary) hypertension: Secondary | ICD-10-CM | POA: Diagnosis present

## 2015-11-11 DIAGNOSIS — R680 Hypothermia, not associated with low environmental temperature: Secondary | ICD-10-CM | POA: Diagnosis present

## 2015-11-11 DIAGNOSIS — E870 Hyperosmolality and hypernatremia: Secondary | ICD-10-CM

## 2015-11-11 DIAGNOSIS — A419 Sepsis, unspecified organism: Secondary | ICD-10-CM | POA: Diagnosis not present

## 2015-11-11 DIAGNOSIS — I248 Other forms of acute ischemic heart disease: Secondary | ICD-10-CM | POA: Diagnosis present

## 2015-11-11 DIAGNOSIS — T68XXXA Hypothermia, initial encounter: Secondary | ICD-10-CM

## 2015-11-11 DIAGNOSIS — Z9841 Cataract extraction status, right eye: Secondary | ICD-10-CM | POA: Diagnosis not present

## 2015-11-11 DIAGNOSIS — E860 Dehydration: Secondary | ICD-10-CM

## 2015-11-11 DIAGNOSIS — E876 Hypokalemia: Secondary | ICD-10-CM | POA: Diagnosis not present

## 2015-11-11 DIAGNOSIS — D539 Nutritional anemia, unspecified: Secondary | ICD-10-CM

## 2015-11-11 DIAGNOSIS — I9589 Other hypotension: Secondary | ICD-10-CM

## 2015-11-11 DIAGNOSIS — D649 Anemia, unspecified: Secondary | ICD-10-CM | POA: Diagnosis present

## 2015-11-11 DIAGNOSIS — Z9842 Cataract extraction status, left eye: Secondary | ICD-10-CM

## 2015-11-11 DIAGNOSIS — F0390 Unspecified dementia without behavioral disturbance: Secondary | ICD-10-CM | POA: Diagnosis present

## 2015-11-11 DIAGNOSIS — E861 Hypovolemia: Secondary | ICD-10-CM | POA: Diagnosis present

## 2015-11-11 DIAGNOSIS — Z66 Do not resuscitate: Secondary | ICD-10-CM | POA: Diagnosis present

## 2015-11-11 DIAGNOSIS — L8992 Pressure ulcer of unspecified site, stage 2: Secondary | ICD-10-CM | POA: Diagnosis present

## 2015-11-11 DIAGNOSIS — F03C0 Unspecified dementia, severe, without behavioral disturbance, psychotic disturbance, mood disturbance, and anxiety: Secondary | ICD-10-CM | POA: Diagnosis present

## 2015-11-11 DIAGNOSIS — R778 Other specified abnormalities of plasma proteins: Secondary | ICD-10-CM

## 2015-11-11 DIAGNOSIS — Z515 Encounter for palliative care: Secondary | ICD-10-CM | POA: Diagnosis present

## 2015-11-11 DIAGNOSIS — R7989 Other specified abnormal findings of blood chemistry: Secondary | ICD-10-CM

## 2015-11-11 DIAGNOSIS — L8990 Pressure ulcer of unspecified site, unspecified stage: Secondary | ICD-10-CM | POA: Insufficient documentation

## 2015-11-11 DIAGNOSIS — G9340 Encephalopathy, unspecified: Secondary | ICD-10-CM | POA: Diagnosis present

## 2015-11-11 LAB — RETICULOCYTES
RBC.: 2.51 MIL/uL — ABNORMAL LOW (ref 3.87–5.11)
RETIC CT PCT: 0.7 % (ref 0.4–3.1)
Retic Count, Absolute: 17.6 10*3/uL — ABNORMAL LOW (ref 19.0–186.0)

## 2015-11-11 LAB — URINALYSIS, ROUTINE W REFLEX MICROSCOPIC
Bilirubin Urine: NEGATIVE
GLUCOSE, UA: NEGATIVE mg/dL
Hgb urine dipstick: NEGATIVE
KETONES UR: NEGATIVE mg/dL
LEUKOCYTES UA: NEGATIVE
Nitrite: NEGATIVE
PROTEIN: 100 mg/dL — AB
Specific Gravity, Urine: 1.025 (ref 1.005–1.030)
pH: 6 (ref 5.0–8.0)

## 2015-11-11 LAB — MRSA PCR SCREENING: MRSA BY PCR: NEGATIVE

## 2015-11-11 LAB — CBC WITH DIFFERENTIAL/PLATELET
Basophils Absolute: 0 10*3/uL (ref 0.0–0.1)
Basophils Relative: 0 %
EOS ABS: 0 10*3/uL (ref 0.0–0.7)
EOS PCT: 0 %
HCT: 32.6 % — ABNORMAL LOW (ref 36.0–46.0)
Hemoglobin: 9.8 g/dL — ABNORMAL LOW (ref 12.0–15.0)
LYMPHS ABS: 2.1 10*3/uL (ref 0.7–4.0)
Lymphocytes Relative: 27 %
MCH: 32.7 pg (ref 26.0–34.0)
MCHC: 30.1 g/dL (ref 30.0–36.0)
MCV: 108.7 fL — AB (ref 78.0–100.0)
MONO ABS: 0.5 10*3/uL (ref 0.1–1.0)
MONOS PCT: 7 %
Neutro Abs: 5.1 10*3/uL (ref 1.7–7.7)
Neutrophils Relative %: 66 %
PLATELETS: 78 10*3/uL — AB (ref 150–400)
RBC: 3 MIL/uL — ABNORMAL LOW (ref 3.87–5.11)
RDW: 17 % — AB (ref 11.5–15.5)
SMEAR REVIEW: DECREASED
WBC: 7.8 10*3/uL (ref 4.0–10.5)

## 2015-11-11 LAB — BASIC METABOLIC PANEL
BUN: 72 mg/dL — ABNORMAL HIGH (ref 6–20)
BUN: 53 mg/dL — ABNORMAL HIGH (ref 6–20)
CO2: 28 mmol/L (ref 22–32)
CO2: 23 mmol/L (ref 22–32)
Calcium: 7 mg/dL — ABNORMAL LOW (ref 8.9–10.3)
Calcium: 6.7 mg/dL — ABNORMAL LOW (ref 8.9–10.3)
Creatinine, Ser: 2.39 mg/dL — ABNORMAL HIGH (ref 0.44–1.00)
Creatinine, Ser: 1.69 mg/dL — ABNORMAL HIGH (ref 0.44–1.00)
GFR calc non Af Amer: 18 mL/min — ABNORMAL LOW (ref >60)
GFR calc non Af Amer: 27 mL/min — ABNORMAL LOW (ref >60)
GFR, EST AFRICAN AMERICAN: 21 mL/min — AB (ref >60)
GFR, EST AFRICAN AMERICAN: 32 mL/min — AB (ref >60)
Glucose, Bld: 72 mg/dL (ref 65–99)
Glucose, Bld: 67 mg/dL (ref 65–99)
POTASSIUM: 3.5 mmol/L (ref 3.5–5.1)
POTASSIUM: 3 mmol/L — AB (ref 3.5–5.1)
SODIUM: 166 mmol/L — AB (ref 135–145)
SODIUM: 160 mmol/L — AB (ref 135–145)

## 2015-11-11 LAB — FERRITIN: Ferritin: 338 ng/mL — ABNORMAL HIGH (ref 11–307)

## 2015-11-11 LAB — IRON AND TIBC
IRON: 62 ug/dL (ref 28–170)
SATURATION RATIOS: 52 % — AB (ref 10.4–31.8)
TIBC: 119 ug/dL — AB (ref 250–450)
UIBC: 57 ug/dL

## 2015-11-11 LAB — COMPREHENSIVE METABOLIC PANEL
ALT: 27 U/L (ref 14–54)
AST: 37 U/L (ref 15–41)
Albumin: 1.9 g/dL — ABNORMAL LOW (ref 3.5–5.0)
Alkaline Phosphatase: 48 U/L (ref 38–126)
BUN: 85 mg/dL — ABNORMAL HIGH (ref 6–20)
CALCIUM: 7.4 mg/dL — AB (ref 8.9–10.3)
CO2: 30 mmol/L (ref 22–32)
CREATININE: 2.77 mg/dL — AB (ref 0.44–1.00)
Chloride: >130 mmol/L (ref 101–111)
GFR calc Af Amer: 18 mL/min — ABNORMAL LOW (ref >60)
GFR calc non Af Amer: 15 mL/min — ABNORMAL LOW (ref >60)
GLUCOSE: 166 mg/dL — AB (ref 65–99)
Potassium: 3.5 mmol/L (ref 3.5–5.1)
SODIUM: 167 mmol/L — AB (ref 135–145)
Total Bilirubin: 0.9 mg/dL (ref 0.3–1.2)
Total Protein: 3.9 g/dL — ABNORMAL LOW (ref 6.5–8.1)

## 2015-11-11 LAB — CBC
HCT: 28.4 % — ABNORMAL LOW (ref 36.0–46.0)
HEMATOCRIT: 27.1 % — AB (ref 36.0–46.0)
HEMOGLOBIN: 8.7 g/dL — AB (ref 12.0–15.0)
HEMOGLOBIN: 8.1 g/dL — AB (ref 12.0–15.0)
MCH: 33 pg (ref 26.0–34.0)
MCH: 31.9 pg (ref 26.0–34.0)
MCHC: 30.6 g/dL (ref 30.0–36.0)
MCHC: 29.9 g/dL — AB (ref 30.0–36.0)
MCV: 107.6 fL — AB (ref 78.0–100.0)
MCV: 106.7 fL — AB (ref 78.0–100.0)
Platelets: 68 10*3/uL — ABNORMAL LOW (ref 150–400)
Platelets: 68 10*3/uL — ABNORMAL LOW (ref 150–400)
RBC: 2.64 MIL/uL — AB (ref 3.87–5.11)
RBC: 2.54 MIL/uL — ABNORMAL LOW (ref 3.87–5.11)
RDW: 16.9 % — ABNORMAL HIGH (ref 11.5–15.5)
RDW: 16.7 % — AB (ref 11.5–15.5)
WBC: 6.2 10*3/uL (ref 4.0–10.5)
WBC: 5.7 10*3/uL (ref 4.0–10.5)

## 2015-11-11 LAB — LACTIC ACID, PLASMA: Lactic Acid, Venous: 1.3 mmol/L (ref 0.5–1.9)

## 2015-11-11 LAB — I-STAT CG4 LACTIC ACID, ED
LACTIC ACID, VENOUS: 2.36 mmol/L — AB (ref 0.5–1.9)
Lactic Acid, Venous: 3.67 mmol/L (ref 0.5–1.9)

## 2015-11-11 LAB — TROPONIN I
TROPONIN I: 0.04 ng/mL — AB (ref <0.03)
TROPONIN I: 0.04 ng/mL — AB (ref <0.03)

## 2015-11-11 LAB — URINE MICROSCOPIC-ADD ON

## 2015-11-11 LAB — VALPROIC ACID LEVEL: Valproic Acid Lvl: 29 ug/mL — ABNORMAL LOW (ref 50.0–100.0)

## 2015-11-11 LAB — PROTIME-INR
INR: 1.16 (ref 0.00–1.49)
PROTHROMBIN TIME: 14.9 s (ref 11.6–15.2)

## 2015-11-11 LAB — APTT: aPTT: 30 seconds (ref 24–37)

## 2015-11-11 LAB — FOLATE: FOLATE: 17 ng/mL (ref >5.9)

## 2015-11-11 LAB — PROCALCITONIN: Procalcitonin: 0.22 ng/mL

## 2015-11-11 LAB — VITAMIN B12: VITAMIN B 12: 1181 pg/mL — AB (ref 180–914)

## 2015-11-11 MED ORDER — DEXTROSE 5 % IV SOLN
2.2500 g | Freq: Four times a day (QID) | INTRAVENOUS | Status: DC
  Administered 2015-11-11 – 2015-11-12 (×3): 2.25 g via INTRAVENOUS
  Filled 2015-11-11 (×13): qty 2.25

## 2015-11-11 MED ORDER — ACETAMINOPHEN 325 MG PO TABS: 650.0000 mg | ORAL_TABLET | Freq: Four times a day (QID) | ORAL | Status: DC | PRN

## 2015-11-11 MED ORDER — SODIUM CHLORIDE 0.9 % IV BOLUS (SEPSIS)
1000.0000 mL | Freq: Once | INTRAVENOUS | Status: AC
  Administered 2015-11-11: 1000 mL via INTRAVENOUS

## 2015-11-11 MED ORDER — ONDANSETRON HCL 4 MG PO TABS: 4.0000 mg | ORAL_TABLET | Freq: Four times a day (QID) | ORAL | Status: DC | PRN

## 2015-11-11 MED ORDER — SODIUM CHLORIDE 0.9 % IV SOLN: INTRAVENOUS | Status: DC

## 2015-11-11 MED ORDER — MORPHINE SULFATE (PF) 2 MG/ML IV SOLN: 2.0000 mg | INTRAVENOUS | Status: DC | PRN

## 2015-11-11 MED ORDER — SODIUM CHLORIDE 0.9 % IV SOLN
1000.0000 mL | Freq: Once | INTRAVENOUS | Status: AC
  Administered 2015-11-11: 1000 mL via INTRAVENOUS

## 2015-11-11 MED ORDER — SODIUM CHLORIDE 0.9 % IV BOLUS (SEPSIS)
1000.0000 mL | Freq: Once | INTRAVENOUS | Status: AC
Start: 2015-11-11 — End: 2015-11-11
  Administered 2015-11-11: 1000 mL via INTRAVENOUS

## 2015-11-11 MED ORDER — ACETAMINOPHEN 650 MG RE SUPP: 650.0000 mg | Freq: Four times a day (QID) | RECTAL | Status: DC | PRN

## 2015-11-11 MED ORDER — DEXTROSE 5 % IV SOLN
INTRAVENOUS | Status: DC
  Administered 2015-11-11 – 2015-11-12 (×3): via INTRAVENOUS

## 2015-11-11 MED ORDER — VANCOMYCIN HCL IN DEXTROSE 1-5 GM/200ML-% IV SOLN
1000.0000 mg | Freq: Once | INTRAVENOUS | Status: AC
  Administered 2015-11-11: 1000 mg via INTRAVENOUS
  Filled 2015-11-11: qty 200

## 2015-11-11 MED ORDER — PIPERACILLIN-TAZOBACTAM 3.375 G IVPB 30 MIN
3.3750 g | Freq: Once | INTRAVENOUS | Status: AC
  Administered 2015-11-11: 3.375 g via INTRAVENOUS
  Filled 2015-11-11: qty 50

## 2015-11-11 MED ORDER — SODIUM CHLORIDE 0.9 % IV BOLUS (SEPSIS)
500.0000 mL | Freq: Once | INTRAVENOUS | Status: AC
  Administered 2015-11-11: 500 mL via INTRAVENOUS

## 2015-11-11 MED ORDER — SODIUM CHLORIDE 0.9% FLUSH
3.0000 mL | Freq: Two times a day (BID) | INTRAVENOUS | Status: DC
  Administered 2015-11-11 – 2015-11-14 (×5): 3 mL via INTRAVENOUS

## 2015-11-11 MED ORDER — SODIUM CHLORIDE 0.9 % IV SOLN
500.0000 mg | INTRAVENOUS | Status: DC
  Filled 2015-11-11: qty 500

## 2015-11-11 MED ORDER — DIVALPROEX SODIUM ER 500 MG PO TB24
750.0000 mg | ORAL_TABLET | Freq: Every day | ORAL | Status: DC
  Filled 2015-11-11 (×4): qty 1

## 2015-11-11 MED ORDER — SODIUM CHLORIDE 0.9 % IV SOLN
INTRAVENOUS | Status: DC
  Administered 2015-11-11: 08:00:00 via INTRAVENOUS

## 2015-11-11 MED ORDER — ONDANSETRON HCL 4 MG/2ML IJ SOLN: 4.0000 mg | Freq: Four times a day (QID) | INTRAMUSCULAR | Status: DC | PRN

## 2015-11-11 MED ORDER — DIVALPROEX SODIUM ER 500 MG PO TB24
750.0000 mg | ORAL_TABLET | Freq: Every day | ORAL | Status: DC
  Filled 2015-11-11 (×6): qty 1

## 2015-11-11 NOTE — ED Notes (Signed)
Pt has had 4000 ml of NS.

## 2015-11-11 NOTE — ED Provider Notes (Signed)
CSN: [id number]     Arrival date & time 11/11/15  0235 History   First MD Initiated Contact with Patient 11/11/15 0243     Chief Complaint  Patient presents with  . Hypotension     (Consider location/radiation/quality/duration/timing/severity/associated sxs/prior Treatment) The history is provided by the EMS personnel and a relative. The history is limited by the condition of the patient (Dementia).  82 year old female with history of severe dementia as well as hypertension was brought in by family because she was not acting normally. She had been gritting her teeth and not quite responding as she normally does. There were not aware of any fever. Yesterday, she had decreased oral intake but had normal oral intake today. They relate that one week ago she was not wetting her diapers, but she is wetting her diapers today. There's been no coughing and no vomiting or diarrhea. EMS was called and noted hypotension with blood pressure 61/33. Family relates that she had been noted to be hypotensive at an office visit recently. Although she does not have a DO NOT RESUSCITATE in place, they do not feel that she would want to be resuscitated or placed on life-support.  Past Medical History  Diagnosis Date  . Heart palpitations     PAC's   . Hypertension   . Anxiety   . GERD (gastroesophageal reflux disease)   . Helicobacter pylori (H. pylori)     treated in 2009  . Bladder cancer ([place])   . Dementia    Past Surgical History  Procedure Laterality Date  . Cholecystectomy  1998  . Bladder surgey  2009  . Cataract extraction  2 years ago    both eyes  . Esophagogastroduodenoscopy  10/19/2011    Procedure: ESOPHAGOGASTRODUODENOSCOPY (EGD);  Surgeon: Rogene Houston, MD;  Location: [facility];  Service: Endoscopy;  Laterality: N/A;  730   Family History  Problem Relation Age of Onset  . Hypertension Mother   . Diabetes Mother   . Pneumonia Mother   . Diabetes Father   . Stroke Sister   .  Stroke Brother   . Hypertension Daughter   . Obesity Daughter   . Diabetes Daughter    Social History  Substance Use Topics  . Smoking status: Never Smoker   . Smokeless tobacco: Never Used  . Alcohol Use: No   OB History    No data available     Review of Systems  Unable to perform ROS: Dementia      Allergies  Codeine and Sulfonamide derivatives  Home Medications   Prior to Admission medications   Medication Sig Start Date End Date Taking? Authorizing Provider  Alum Hydroxide-Mag Carbonate (GAVISCON PO) Take 10 mLs by mouth daily as needed (acid reflux). Per the patient's daughter she takes 1 by mouth as needed.    Historical Provider, MD  amLODipine-benazepril (LOTREL) 5-40 MG capsule TAKE 1 CAPSULE BY MOUTH ONCE A DAY. 04/14/15   Fayrene Helper, MD  cloNIDine (CATAPRES) 0.1 MG tablet TAKE 1 TABLET BY MOUTH AT BEDTIME. 09/02/15   Fayrene Helper, MD  divalproex (DEPAKOTE ER) 250 MG 24 hr tablet Take 750 mg by mouth daily.    Historical Provider, MD  EXELON 13.3 MG/24HR PT24 Apply one patch to skin daily, remove after 24 hours, and replace 02/18/15   Fayrene Helper, MD  megestrol (MEGACE) 40 MG tablet Take 1 tablet (40 mg total) by mouth daily. 10/17/15   Fayrene Helper, MD  Menthol-Zinc Oxide (  CHAMOSYN) 0.45-20 % OINT Apply to affected area as needed daily 08/29/15   Fayrene Helper, MD  pantoprazole (PROTONIX) 40 MG tablet Take 1 tablet (40 mg total) by mouth daily before breakfast. 12/10/14   Rogene Houston, MD  pantoprazole (PROTONIX) 40 MG tablet TAKE (1) TABLET TWICE DAILY. 08/15/15   Rona Ravens Setzer, NP  polyethylene glycol powder (GLYCOLAX/MIRALAX) powder Take 8.5 g by mouth daily as needed. Patient taking differently: Take 8.5 g by mouth daily as needed for mild constipation or moderate constipation.  09/21/14   Rogene Houston, MD  pravastatin (PRAVACHOL) 40 MG tablet TAKE 1 TABLET IN THE EVENING. 09/16/15   Fayrene Helper, MD  temazepam (RESTORIL) 30  MG capsule Take 1 capsule (30 mg total) by mouth at bedtime as needed for sleep. 07/07/15   Fayrene Helper, MD   BP 77/58 mmHg  Pulse 70  Temp(Src) 95.4 F (35.2 C) (Rectal)  Resp 16 Physical Exam  Nursing note and vitals reviewed.  Frail and emaciated 82 year old female, resting comfortably and in no acute distress. Vital signs are significant for hypothermia and hypotension. Oxygen saturation is 100%, which is normal. Head is normocephalic and atraumatic. PERRLA, EOMI. Oropharynx is clear. Neck is nontender and supple without adenopathy or JVD. Back is nontender and there is no CVA tenderness. Lungs are clear without rales, wheezes, or rhonchi. Chest is nontender. Heart has regular rate and rhythm without murmur. Abdomen is soft, flat, nontender without masses or hepatosplenomegaly and peristalsis is normoactive. Extremities have no cyanosis or edema, full range of motion is present. Skin is warm and dry. Stage I sacral decubitus ulcer is present. Neurologic: She is awake and alert but nonverbal and does not follow commands, cranial nerves are intact, there are no motor or sensory deficits. Mild resting tremor is present.  ED Course  Procedures (including critical care time) Labs Review Results for orders placed or performed during the hospital encounter of 11/11/15  Blood Culture (routine x 2)  Result Value Ref Range   Specimen Description BLOOD RIGHT ANTECUBITAL    Special Requests      BOTTLES DRAWN AEROBIC AND ANAEROBIC AEB 4CC ANA 2CC   Culture PENDING    Report Status PENDING   Blood Culture (routine x 2)  Result Value Ref Range   Specimen Description BLOOD LEFT WRIST    Special Requests BOTTLES DRAWN AEROBIC ONLY 2CC    Culture PENDING    Report Status PENDING   CBC WITH DIFFERENTIAL  Result Value Ref Range   WBC 7.8 4.0 - 10.5 K/uL   RBC 3.00 (L) 3.87 - 5.11 MIL/uL   Hemoglobin 9.8 (L) 12.0 - 15.0 g/dL   HCT 32.6 (L) 36.0 - 46.0 %   MCV 108.7 (H) 78.0 - 100.0  fL   MCH 32.7 26.0 - 34.0 pg   MCHC 30.1 30.0 - 36.0 g/dL   RDW 17.0 (H) 11.5 - 15.5 %   Platelets 78 (L) 150 - 400 K/uL   Neutrophils Relative % 66 %   Neutro Abs 5.1 1.7 - 7.7 K/uL   Lymphocytes Relative 27 %   Lymphs Abs 2.1 0.7 - 4.0 K/uL   Monocytes Relative 7 %   Monocytes Absolute 0.5 0.1 - 1.0 K/uL   Eosinophils Relative 0 %   Eosinophils Absolute 0.0 0.0 - 0.7 K/uL   Basophils Relative 0 %   Basophils Absolute 0.0 0.0 - 0.1 K/uL   Smear Review PLATELETS APPEAR DECREASED   Urinalysis,  Routine w reflex microscopic (not at [facility])  Result Value Ref Range   Color, Urine AMBER (A) YELLOW   APPearance CLEAR CLEAR   Specific Gravity, Urine 1.025 1.005 - 1.030   pH 6.0 5.0 - 8.0   Glucose, UA NEGATIVE NEGATIVE mg/dL   Hgb urine dipstick NEGATIVE NEGATIVE   Bilirubin Urine NEGATIVE NEGATIVE   Ketones, ur NEGATIVE NEGATIVE mg/dL   Protein, ur 100 (A) NEGATIVE mg/dL   Nitrite NEGATIVE NEGATIVE   Leukocytes, UA NEGATIVE NEGATIVE  Procalcitonin  Result Value Ref Range   Procalcitonin 0.22 ng/mL  Valproic acid level  Result Value Ref Range   Valproic Acid Lvl 29 (L) 50.0 - 100.0 ug/mL  Urine microscopic-add on  Result Value Ref Range   Squamous Epithelial / LPF 0-5 (A) NONE SEEN   WBC, UA 0-5 0 - 5 WBC/hpf   RBC / HPF 0-5 0 - 5 RBC/hpf   Bacteria, UA MANY (A) NONE SEEN   Urine-Other MUCOUS PRESENT   Comprehensive metabolic panel  Result Value Ref Range   Sodium 167 (HH) 135 - 145 mmol/L   Potassium 3.5 3.5 - 5.1 mmol/L   Chloride >130 (HH) 101 - 111 mmol/L   CO2 30 22 - 32 mmol/L   Glucose, Bld 166 (H) 65 - 99 mg/dL   BUN 85 (H) 6 - 20 mg/dL   Creatinine, Ser 2.77 (H) 0.44 - 1.00 mg/dL   Calcium 7.4 (L) 8.9 - 10.3 mg/dL   Total Protein 3.9 (L) 6.5 - 8.1 g/dL   Albumin 1.9 (L) 3.5 - 5.0 g/dL   AST 37 15 - 41 U/L   ALT 27 14 - 54 U/L   Alkaline Phosphatase 48 38 - 126 U/L   Total Bilirubin 0.9 0.3 - 1.2 mg/dL   GFR calc non Af Amer 15 (L) >60 mL/min   GFR calc Af  Amer 18 (L) >60 mL/min  Troponin I  Result Value Ref Range   Troponin I 0.04 (HH) <0.03 ng/mL  Reticulocytes  Result Value Ref Range   Retic Ct Pct 0.7 0.4 - 3.1 %   RBC. 2.51 (L) 3.87 - 5.11 MIL/uL   Retic Count, Manual 17.6 (L) 19.0 - 186.0 K/uL  I-Stat CG4 Lactic Acid, ED  (not at  [facility])  Result Value Ref Range   Lactic Acid, Venous 3.67 (HH) 0.5 - 1.9 mmol/L   Comment NOTIFIED PHYSICIAN    Imaging Review Dg Chest Port 1 View  11/11/2015  CLINICAL DATA:  Altered mental status. EXAM: PORTABLE CHEST 1 VIEW COMPARISON:  07/30/2013 FINDINGS: Presumed skin fold projects over the lateral right hemithorax. Lung markings extend beyond this lucency, no evidence for pneumothorax. Lung volumes are low. Small linear opacity at the right lung base likely atelectasis Heart size and mediastinal contours are unchanged. No pleural effusion or pulmonary edema. No acute osseous abnormality. IMPRESSION: Low lung volumes.  Linear right basilar atelectasis. Skin fold projects over the right hemithorax. Electronically Signed   By: Jeb Levering M.D.   On: 11/11/2015 03:41   I have personally reviewed and evaluated these images and lab results as part of my medical decision-making.   EKG Interpretation   Date/Time:  Friday November 11 2015 02:49:35 EDT Ventricular Rate:  64 PR Interval:    QRS Duration: 108 QT Interval:  460 QTC Calculation: 475 R Axis:   85 Text Interpretation:  Incomplete analysis due to missing data in  precordial lead(s) Normal sinus rhythm Borderline right axis deviation Low  voltage, extremity  leads Baseline wander in lead(s) V4 Missing lead(s): V6  Nonspecific T wave abnormality When compared with ECG of 06/17/2015, QRS  voltage has decreased Nonspecific T wave abnormality is now Present  Confirmed by [facility]  MD, Son Barkan ([id number]) on 11/11/2015 3:01:46 AM      CRITICAL CARE Performed by: [id number] Total critical care time: 180 minutes Critical care time was exclusive of  separately billable procedures and treating other patients. Critical care was necessary to treat or prevent imminent or life-threatening deterioration. Critical care was time spent personally by me on the following activities: development of treatment plan with patient and/or surrogate as well as nursing, discussions with consultants, evaluation of patient's response to treatment, examination of patient, obtaining history from patient or surrogate, ordering and performing treatments and interventions, ordering and review of laboratory studies, ordering and review of radiographic studies, pulse oximetry and re-evaluation of patient's condition. MDM   Final diagnoses:  Other specified hypotension  Hypothermia, initial encounter  Hypernatremia  Acute kidney injury (nontraumatic) (HCC)  Dehydration  Elevated troponin I level  Macrocytic anemia  Thrombocytopenia (HCC)    Altered mentation with hypotension and hypothermia. She is started on the sepsis protocol based on altered mentation, hypothermia, and hypotension. She started on early goal-directed fluid therapy and antibiotics will be given. Old records are reviewed and she actually has had normal blood pressures on those visits were I was able to find blood pressure readings.  Blood pressure initially came up with IV hydration, but then fell. Lactic acid level is come back moderately elevated at 3.68. Urinalysis did have many bacteria suggesting this is the source of her sepsis.  Laboratory workup shows severe hypernatremia with evidence of acute kidney injury in pattern suggesting dehydration. At this point, I feel that dehydration is more responsible for her condition then sepsis and I doubt if she is truly septic. However, antibodies will be continued pending cultures. Troponin is come back minimally elevated consistent with demand ischemia-no evidence of actual acute coronary syndrome. Hemoglobin has shown a significant drop from February of  this year with macrocytosis and thrombocytopenia strongly suggestive of liter B12 deficiency. Case is discussed with Dr. Lorin Mercy of [facility] who agrees to admit the patient.  Repeat lactic acid has come back significantly improved at 2.36. She is likely to need significantly more fluid for adequate hydration. Blood pressure is still tenuous. She will be admitted to stepdown unit.  Delora Fuel, MD [id number]

## 2015-11-11 NOTE — ED Notes (Signed)
MD at bedside. 

## 2015-11-11 NOTE — Progress Notes (Signed)
Pt BP dropped to 60s/20s. MD made aware and 1L NS bolus ordered. Bolus currently running.

## 2015-11-11 NOTE — Progress Notes (Signed)
Pt non-compliant with keeping oximeter and SCDs on. Pt fidgets and does not leave cords/lines alone. Pt continually reminded to not touch lines. IV wrapped with gauze to prevent pt from removing. Will continue to monitor.

## 2015-11-11 NOTE — ED Notes (Signed)
CRITICAL VALUE ALERT  Critical value received:  Troponin 0.04  Date of notification:  11/11/15  Time of notification:  0601  Critical value read back:Yes.    Nurse who received alert:  Derek Mound, RN  MD notified (1st page):  Roxanne Mins  Time of first page:  0601  MD notified (2nd page):  Time of second page:  Responding MD:  Roxanne Mins   Time MD responded:  [phone]

## 2015-11-11 NOTE — ED Notes (Signed)
CRITICAL VALUE ALERT  Critical value received:  Lactic 2.36  Date of notification:  11/11/15  Time of notification:  0637  Critical value read back:Yes.    Nurse who received alert:  Derek Mound, RN  MD notified (1st page):  Yates  Time of first page:  [phone]  MD notified (2nd page):  Time of second page:  Responding MD:  Lorin Mercy  Time MD responded:  [phone]

## 2015-11-11 NOTE — Progress Notes (Signed)
PROGRESS NOTE  [name] [id number] DOB: 08/15/1933 DOA: 11/11/2015 PCP: [name], MD  Brief Narrative: 87 yof presented to the ED with complaints of altered mental status. While in the ED, she was noted to have altered mentation with hypothermia and hypotension. Sepsis protocol was initiated and the patient was started on fluid rescusitation and empiric antibiotics. Patient was admitted for further management.  Assessment/Plan: 1. Sepsis unclear source with hypotension, hypothermia, altered mental status. Urinalysis negative. Chest x-ray clear. Cultures pending. Emergency department physician suspected profound dehydration as etiology for her presenting features rather than sepsis. For now we'll continue empiric antibiotics. 2. Severe dehydration secondary to failure to thrive, hypovolemic hypernatremia secondary to advanced dementia.  3. Acute kidney injury secondary to failure to thrive, very poor oral intake. 4. Demand ischemia 5. Macrocytosis, thrombocytopenia suggestive of B12 deficiency  6. PMH bladder cancer 7. Advanced dementia   Continue current management as outline by Dr. Lorin Mercy. Prognosis guarded.  DVT prophylaxis: SCDs  Code Status: DNR/DNI Family Communication: no family present Disposition Plan: Pending  [name], MD  [facility] Direct contact: [phone] --Via amion app OR  --[email or web address]; password [email or web address]  7PM-7AM contact night coverage as above 11/11/2015, 10:00 AM  LOS: 0 days   Consultants:  none  Procedures:  none  Antimicrobials:  Vancomycin 7/7 >>  Zosyn 7/7 >>  HPI/Subjective: Patient awake and alert but does not respond verbally.  Objective: Filed Vitals:   11/11/15 0600 11/11/15 0630 11/11/15 0645 11/11/15 0700  BP: 85/41 71/52 85/39  95/55  Pulse:  65 60 60  Temp: 95.2 F (35.1 C) 95.5 F (35.3 C) 95.5 F (35.3 C) 95.7 F (35.4 C)  TempSrc:      Resp: 14 15 15 20   SpO2:  100% 100% 100%     Intake/Output Summary (Last 24 hours) at 11/11/15 1000 Last data filed at 11/11/15 0754  Gross per 24 hour  Intake   3500 ml  Output    350 ml  Net   3150 ml     There were no vitals filed for this visit.  Exam: Constitutional:  . Appears calm and comfortable.  Marland Kitchen Appears nontoxic.  Marland Kitchen Eyes open but does not respond verbally. Does not follow commands Respiratory:  . CTA bilaterally, no w/r/r.  . Respiratory effort normal. No retractions or accessory muscle use Cardiovascular:  . Bradycardia, regular rhythm, no m/r/g . No LE extremity edema   . Telemetry SR, bradycardia Musculoskeletal:  o Feet are warm and dray  I have personally reviewed following labs and imaging studies:  Admission lab work noted  Scheduled Meds: . sodium chloride   Intravenous STAT  . divalproex  750 mg Oral Daily  . piperacillin-tazobactam (ZOSYN) IVPB  2.25 g Intravenous Q6H  . sodium chloride  500 mL Intravenous Once  . sodium chloride flush  3 mL Intravenous Q12H  . [START ON 11/13/2015] vancomycin  500 mg Intravenous Q48H   Continuous Infusions: . dextrose      Principal Problem:   Sepsis ([place]) Active Problems:   Hypotension   Hypothermia   Severe dementia   Acute hypernatremia   Acute kidney injury ([place])   Anemia   Thrombocytopenia (HCC)   LOS: 0 days   Time spent 25 minutes  By signing my name below, I, Delene Ruffini, attest that this documentation has been prepared under the direction and in the presence of Takina Busser P. Sarajane Jews, MD. Electronically Signed: Delene Ruffini, Scribe.  11/11/2015 9:15am     I  personally performed the services described in this documentation. All medical record entries made by the scribe were at my direction. I have reviewed the chart and agree that the record reflects my personal performance and is accurate and complete. [name], MD

## 2015-11-11 NOTE — ED Notes (Signed)
Per family pt is not acting right or making her normal sounds. Pt is nonverbal and has dementia. When ems arrived pt's blood pressure was 61/33 and cbg was 112

## 2015-11-11 NOTE — Progress Notes (Signed)
Pharmacy Antibiotic Note  BRITNI PLETT is a 82 y.o. female admitted on 11/11/2015 with sepsis.  Pharmacy has been consulted for vanc and zosyn dosing. Initial doses ordered in the ED  Plan: Cont zosyn 2.25 gm IV q6 hours Cont vancomycin 500 mg IV q48 hours F/u renal function, cultures and clinical course     Temp (24hrs), Avg:95.3 F (35.2 C), Min:94.6 F (34.8 C), Max:95.7 F (35.4 C)   Recent Labs Lab 11/11/15 0320 11/11/15 0328 11/11/15 0504 11/11/15 0629 11/11/15 0741  WBC 7.8  --   --   --  6.2  CREATININE  --   --  2.77*  --   --   LATICACIDVEN  --  3.67*  --  2.36*  --     CrCl cannot be calculated (Unknown ideal weight.).    Allergies  Allergen Reactions  . Codeine Other (See Comments)    UNKNOWN  . Sulfonamide Derivatives Other (See Comments)    UNKNOWN    Antimicrobials this admission: vanc 7/7 >>  zosyn 7/7 >>    Thank you for allowing pharmacy to be a part of this patient's care.  Excell Seltzer Poteet 11/11/2015 8:17 AM

## 2015-11-11 NOTE — Progress Notes (Signed)
In to see patient at this time. Patient noted to have persistent hypotension status post 1 Liter bolus.  MD notified at this time of blood pressures maps 44-47 with sbp's in 65-70's. Manual bp taken and 80/35 reported to MD.  MD Sarajane Jews to order 1000 cc bolus over 2 hours. Will continue to monitor the patient closely.

## 2015-11-11 NOTE — H&P (Signed)
History and Physical    MAROLYN TROTTER [id number] DOB: 03-Jan-1934 DOA: 11/11/2015  PCP: [name], MD Consultants:  GI - [facility] Patient coming from: home  Chief Complaint:  Non-verbal but ?moaning tonight  HPI: Tamara VANSKIVER is a 82 y.o. female with medical history significant of advanced dementia who is cared for at home by her 2 daughters.  The patient is non-verbal and so history was obtained from the daughters.  They report that the patient has been clinching her teeth and flinching today.  Mouth really dry.  Family noticed that she was not peeing about 1-2 weeks ago but this improved.  PO intake has fluctuated.  Less PO intake and more clinching teeth over the last 3 days.  Seems to have been slowly worsening over the last few days.  No fevers.  Non-verbal so no way to tell if uncomfortable.   Forcefully exhaling.  Usually drinks 3 bottles/day of Ensure, also spitting out foods that she previously would eat - this is over several weeks.  Will take in fluids but not always swallow them.    ED Course: Altered mentation with hypothermia and hypotension - initiated sepsis protocol and started early goal-directed fluid therapy and Vanc/Zosyn.  BP improved with bolus but then fell again.  Lactate 3.68 but improved to 2.36 after IVF.  Review of Systems: As per HPI; otherwise 10 point review of systems reviewed with daughters and negative as well as we can tell.   Ambulatory Status: bedbound  Past Medical History  Diagnosis Date  . Heart palpitations     PAC's   . Hypertension   . Anxiety   . GERD (gastroesophageal reflux disease)   . Helicobacter pylori (H. pylori)     treated in 2009  . Bladder cancer ([place]) 2009  . Dementia     Past Surgical History  Procedure Laterality Date  . Cholecystectomy  1998  . Bladder surgey  2009  . Cataract extraction  2 years ago    both eyes  . Esophagogastroduodenoscopy  10/19/2011    Procedure: ESOPHAGOGASTRODUODENOSCOPY (EGD);   Surgeon: Rogene Houston, MD;  Location: [facility];  Service: Endoscopy;  Laterality: N/A;  730    Social History   Social History  . Marital Status: Widowed    Spouse Name: N/A  . Number of Children: N/A  . Years of Education: N/A   Occupational History  . retired     Social History Main Topics  . Smoking status: Never Smoker   . Smokeless tobacco: Never Used  . Alcohol Use: No  . Drug Use: No  . Sexual Activity: No   Other Topics Concern  . Not on file   Social History Narrative   Widowed, 3 children, 1 deceased   Right handed   High school    No caffeine          Allergies  Allergen Reactions  . Codeine Other (See Comments)    UNKNOWN  . Sulfonamide Derivatives Other (See Comments)    UNKNOWN    Family History  Problem Relation Age of Onset  . Hypertension Mother   . Diabetes Mother   . Pneumonia Mother   . Diabetes Father   . Stroke Sister   . Stroke Brother   . Hypertension Daughter   . Obesity Daughter   . Diabetes Daughter     Prior to Admission medications   Medication Sig Start Date End Date Taking? Authorizing Provider  Alum Hydroxide-Mag Carbonate (GAVISCON  PO) Take 10 mLs by mouth daily as needed (acid reflux). Per the patient's daughter she takes 1 by mouth as needed.    Historical Provider, MD  amLODipine-benazepril (LOTREL) 5-40 MG capsule TAKE 1 CAPSULE BY MOUTH ONCE A DAY. 04/14/15   Fayrene Helper, MD  cloNIDine (CATAPRES) 0.1 MG tablet TAKE 1 TABLET BY MOUTH AT BEDTIME. 09/02/15   Fayrene Helper, MD  divalproex (DEPAKOTE ER) 250 MG 24 hr tablet Take 750 mg by mouth daily.    Historical Provider, MD  EXELON 13.3 MG/24HR PT24 Apply one patch to skin daily, remove after 24 hours, and replace 02/18/15   Fayrene Helper, MD  megestrol (MEGACE) 40 MG tablet Take 1 tablet (40 mg total) by mouth daily. 10/17/15   Fayrene Helper, MD  Menthol-Zinc Oxide [facility]) 0.45-20 % OINT Apply to affected area as needed daily 08/29/15    Fayrene Helper, MD  pantoprazole (PROTONIX) 40 MG tablet Take 1 tablet (40 mg total) by mouth daily before breakfast. 12/10/14   Rogene Houston, MD  pantoprazole (PROTONIX) 40 MG tablet TAKE (1) TABLET TWICE DAILY. 08/15/15   Rona Ravens Setzer, NP  polyethylene glycol powder (GLYCOLAX/MIRALAX) powder Take 8.5 g by mouth daily as needed. Patient taking differently: Take 8.5 g by mouth daily as needed for mild constipation or moderate constipation.  09/21/14   Rogene Houston, MD  pravastatin (PRAVACHOL) 40 MG tablet TAKE 1 TABLET IN THE EVENING. 09/16/15   Fayrene Helper, MD  temazepam (RESTORIL) 30 MG capsule Take 1 capsule (30 mg total) by mouth at bedtime as needed for sleep. 07/07/15   Fayrene Helper, MD    Physical Exam: Filed Vitals:   11/11/15 0530 11/11/15 0545 11/11/15 0600 11/11/15 0630  BP: 78/29 76/51 85/41  71/52  Pulse: 61   65  Temp: 95.4 F (35.2 C) 95.2 F (35.1 C) 95.2 F (35.1 C) 95.5 F (35.3 C)  TempSrc:      Resp: 14 21 14 15   SpO2: 99%   100%     General: Opens eyes to voice/touch but minimally responds other than withdrawing to pain Eyes:  PERRL, EOMI, normal lids, iris ENT:  grossly normal hearing, lips & tongue, very dry mucus membranes Neck:  no LAD, masses or thyromegaly Cardiovascular:  RRR, no m/r/g. No LE edema.  Respiratory:  CTA bilaterally, no w/r/r. Normal respiratory effort. Abdomen:  soft, ntnd, NABS Skin:  no rash or induration seen on limited exam Musculoskeletal: cachexia, muscle wasting Psychiatric: eyes open to voice, touch but no significant attempt at response Neurologic: unable to perform  Labs on Admission: I have personally reviewed following labs and imaging studies  CBC:  Recent Labs Lab 11/11/15 0320  WBC 7.8  NEUTROABS 5.1  HGB 9.8*  HCT 32.6*  MCV 108.7*  PLT 78*   Basic Metabolic Panel:  Recent Labs Lab 11/11/15 0504  NA 167*  K 3.5  CL >130*  CO2 30  GLUCOSE 166*  BUN 85*  CREATININE 2.77*  CALCIUM  7.4*   GFR: CrCl cannot be calculated (Unknown ideal weight.). Liver Function Tests:  Recent Labs Lab 11/11/15 0504  AST 37  ALT 27  ALKPHOS 48  BILITOT 0.9  PROT 3.9*  ALBUMIN 1.9*   No results for input(s): LIPASE, AMYLASE in the last 168 hours. No results for input(s): AMMONIA in the last 168 hours. Coagulation Profile: No results for input(s): INR, PROTIME in the last 168 hours. Cardiac Enzymes:  Recent Labs Lab  11/11/15 0504  TROPONINI 0.04*   BNP (last 3 results) No results for input(s): PROBNP in the last 8760 hours. HbA1C: No results for input(s): HGBA1C in the last 72 hours. CBG: No results for input(s): GLUCAP in the last 168 hours. Lipid Profile: No results for input(s): CHOL, HDL, LDLCALC, TRIG, CHOLHDL, LDLDIRECT in the last 72 hours. Thyroid Function Tests: No results for input(s): TSH, T4TOTAL, FREET4, T3FREE, THYROIDAB in the last 72 hours. Anemia Panel:  Recent Labs  11/11/15 0504  RETICCTPCT 0.7   Urine analysis:    Component Value Date/Time   COLORURINE AMBER* 11/11/2015 0315   APPEARANCEUR CLEAR 11/11/2015 0315   LABSPEC 1.025 11/11/2015 0315   PHURINE 6.0 11/11/2015 0315   GLUCOSEU NEGATIVE 11/11/2015 0315   GLUCOSEU NEG mg/dL 11/20/2007 2329   HGBUR NEGATIVE 11/11/2015 0315   HGBUR moderate 11/25/2009 0803   BILIRUBINUR NEGATIVE 11/11/2015 0315   KETONESUR NEGATIVE 11/11/2015 0315   PROTEINUR 100* 11/11/2015 0315   UROBILINOGEN 0.2 06/10/2013 0440   NITRITE NEGATIVE 11/11/2015 0315   LEUKOCYTESUR NEGATIVE 11/11/2015 0315    Creatinine Clearance: CrCl cannot be calculated (Unknown ideal weight.).  Sepsis Labs: @LABRCNTIP (procalcitonin:4,lacticidven:4) ) Recent Results (from the past 240 hour(s))  Blood Culture (routine x 2)     Status: None (Preliminary result)   Collection Time: 11/11/15  3:20 AM  Result Value Ref Range Status   Specimen Description BLOOD RIGHT ANTECUBITAL  Final   Special Requests   Final    BOTTLES  DRAWN AEROBIC AND ANAEROBIC AEB 4CC ANA 2CC   Culture PENDING  Incomplete   Report Status PENDING  Incomplete  Blood Culture (routine x 2)     Status: None (Preliminary result)   Collection Time: 11/11/15  3:26 AM  Result Value Ref Range Status   Specimen Description BLOOD LEFT WRIST  Final   Special Requests BOTTLES DRAWN AEROBIC ONLY 2CC  Final   Culture PENDING  Incomplete   Report Status PENDING  Incomplete     Radiological Exams on Admission: Dg Chest Port 1 View  11/11/2015  CLINICAL DATA:  Altered mental status. EXAM: PORTABLE CHEST 1 VIEW COMPARISON:  07/30/2013 FINDINGS: Presumed skin fold projects over the lateral right hemithorax. Lung markings extend beyond this lucency, no evidence for pneumothorax. Lung volumes are low. Small linear opacity at the right lung base likely atelectasis Heart size and mediastinal contours are unchanged. No pleural effusion or pulmonary edema. No acute osseous abnormality. IMPRESSION: Low lung volumes.  Linear right basilar atelectasis. Skin fold projects over the right hemithorax. Electronically Signed   By: Jeb Levering M.D.   On: 11/11/2015 03:41    EKG: Independently reviewed.  NSR; nonspecific ST changes with no evidence of acute ischemia  Assessment/Plan Principal Problem:   Hypotension Active Problems:   Hypothermia   Severe dementia   Acute hypernatremia   Acute kidney injury ([place])   Anemia   Thrombocytopenia (H[facility])   Patient with advanced dementia presenting with acute hypotension/hypothermia found to have markedly elevated Na+, AKI, anemia and thrombocytopenia. -Sepsis protocol initiated and will continue Vanc/Zosyn for now.  No known source, as U/A is generally unremarkable and CXR is WNL.  Blood cultures and urine culture are pending.  LP is not indicated in this patient (see below). -Appears to be consistent with severe dehydration, hypovolemic hypernatremia.  BP initially responded to IVF and then fell again.  Will continue  MIVF and then continue to bolus 500 mL over an hour for SBP <90.  Suspect that patient  is profoundly volume deficient and may require several additional boluses. -New anemia with thrombocytopenia may be related to sepsis, dehydration, or other such as occult GI bleeding or malignancy. -Will recheck CBC and BMP q12 hours for now. -Continue to trend lactate. -Hold all home medications except Depakote. -NPO for now. -Based on advanced dementia and overall quality of life, daughters are in agreement with plan to provide aggressive treatment (IVF, antibiotics) for the next 12-24 hours in SDU.  If she is not stabilizing/improving within that time frame, will transition to comfort care measures only. -Patient is DNR/DNI.   DVT prophylaxis: SCDs due to thrombocytopenia Code Status: DNR/DNI Family Communication: Daughters present at bedside throughout and engaged and in agreement with plan  Disposition Plan: Home once clinically improved vs. Transition to comfort care only Consults called: None Admission status: Admit to SDU   Karmen Bongo MD [facility]  If 7PM-7AM, please contact night-coverage [email or web address] Password [email or web address]  11/11/2015, 7:02 AM

## 2015-11-11 NOTE — ED Notes (Addendum)
CRITICAL VALUE ALERT  Critical value received:  Sodium 167,  Chloride 138  Date of notification:  11/11/15  Time of notification:  [id number]  Critical value read back:Yes.    Nurse who received alert:  Derek Mound, RN  MD notified (1st page):  Roxanne Mins  Time of first page:  [phone]  MD notified (2nd page):  Time of second page:  Responding MD:  Roxanne Mins  Time MD responded:  [phone]

## 2015-11-12 ENCOUNTER — Telehealth: Payer: Self-pay | Admitting: Family Medicine

## 2015-11-12 DIAGNOSIS — N179 Acute kidney failure, unspecified: Secondary | ICD-10-CM

## 2015-11-12 DIAGNOSIS — I959 Hypotension, unspecified: Secondary | ICD-10-CM

## 2015-11-12 DIAGNOSIS — F0390 Unspecified dementia without behavioral disturbance: Secondary | ICD-10-CM

## 2015-11-12 DIAGNOSIS — L8990 Pressure ulcer of unspecified site, unspecified stage: Secondary | ICD-10-CM | POA: Insufficient documentation

## 2015-11-12 DIAGNOSIS — E870 Hyperosmolality and hypernatremia: Secondary | ICD-10-CM

## 2015-11-12 DIAGNOSIS — T68XXXA Hypothermia, initial encounter: Secondary | ICD-10-CM

## 2015-11-12 DIAGNOSIS — D696 Thrombocytopenia, unspecified: Secondary | ICD-10-CM

## 2015-11-12 LAB — CBC
HEMATOCRIT: 30.9 % — AB (ref 36.0–46.0)
Hemoglobin: 9.9 g/dL — ABNORMAL LOW (ref 12.0–15.0)
MCH: 33 pg (ref 26.0–34.0)
MCHC: 32 g/dL (ref 30.0–36.0)
MCV: 103 fL — ABNORMAL HIGH (ref 78.0–100.0)
PLATELETS: 78 10*3/uL — AB (ref 150–400)
RBC: 3 MIL/uL — ABNORMAL LOW (ref 3.87–5.11)
RDW: 15.8 % — AB (ref 11.5–15.5)
WBC: 7.1 10*3/uL (ref 4.0–10.5)

## 2015-11-12 LAB — BASIC METABOLIC PANEL
Anion gap: 3 — ABNORMAL LOW (ref 5–15)
BUN: 31 mg/dL — AB (ref 6–20)
CALCIUM: 6.8 mg/dL — AB (ref 8.9–10.3)
CO2: 23 mmol/L (ref 22–32)
CREATININE: 1.27 mg/dL — AB (ref 0.44–1.00)
Chloride: 124 mmol/L — ABNORMAL HIGH (ref 101–111)
GFR calc Af Amer: 45 mL/min — ABNORMAL LOW (ref >60)
GFR, EST NON AFRICAN AMERICAN: 39 mL/min — AB (ref >60)
Glucose, Bld: 123 mg/dL — ABNORMAL HIGH (ref 65–99)
POTASSIUM: 2.7 mmol/L — AB (ref 3.5–5.1)
SODIUM: 150 mmol/L — AB (ref 135–145)

## 2015-11-12 LAB — URINE CULTURE: CULTURE: NO GROWTH

## 2015-11-12 MED ORDER — FENTANYL CITRATE (PF) 100 MCG/2ML IJ SOLN: 12.5000 ug | INTRAMUSCULAR | Status: DC | PRN

## 2015-11-12 MED ORDER — LORAZEPAM 2 MG/ML IJ SOLN
0.5000 mg | Freq: Four times a day (QID) | INTRAMUSCULAR | Status: DC | PRN
Start: 2015-11-12 — End: 2015-11-15
  Administered 2015-11-12: 0.5 mg via INTRAVENOUS
  Filled 2015-11-12: qty 1

## 2015-11-12 NOTE — Progress Notes (Signed)
PROGRESS NOTE  [name] [id number] DOB: 07-08-1933 DOA: 11/11/2015 PCP: [name], MD  Brief Narrative: 20 yof presented to the ED with complaints of altered mental status. While in the ED, she was noted to have altered mentation with hypothermia and hypotension. Sepsis protocol was initiated and the patient was started on fluid rescusitation and empiric antibiotics, and admitted to SDU for further management.   Assessment/Plan: 1. Sepsis unclear source with hypotension, hypothermia, altered mental status. Urinalysis negative and chest x-ray clear. Blood cultures no growth thus far, urine culture pending. Recurrent hypothermia and refractory hypotension concerning, this despite antibiotics and very aggressive volume resuscitation. 2. Severe dehydration, hypovolemic hypernatremia, failure to thrive, poor oral intake secondary to advanced dementia. 3. Refractory hypotension despite aggressive volume resuscitation, secondary to sepsis and dehydration. 4. Acute kidney injury secondary to failure to thrive and poor oral intake. Renal function improved with adequate urine output. 5. Hypokalemia 6. Demand ischemia 7. Macrocytic anemia, B12 elevated 8. Thrombocytopenia 9. Advanced dementia with failure to thrive.    Discussed in detail with 2 daughters who work at caregivers at home at bedside. The patient has advanced dementia, total care, will bite and kick at baseline. On some days she is able to speak one word at a time. Oral intake has been very poor. We discussed refractory hypotension and recurrent hypothermia which is ominous and despite laboratory data improvement the patient's not made clinical improvement. Both sisters feel at this time given advanced dementia and poor oral intake, transition should be made comfort care with discontinuation of fluids and all medications not directed at comfort. They understand that medications for comfort including pain or anxiety medications  can lower blood pressure.  Transition to comfort care, transfer to medical bed, observe. Possible return home with hospice 7/9 or 7/10 depending on clinical stability life expectancy less than 1 week at this point.  Code Status: DNR/DNI Family Communication: Discussed with daughters present at bedside. Disposition Plan:   [name], MD  [facility] Direct contact: [phone] --Via amion app OR  --[email or web address]; password [email or web address]  7PM-7AM contact night coverage as above 11/12/2015, 5:35 AM  LOS: 1 day   Consultants:  none  Procedures:  none  Antimicrobials:  Vancomycin 7/7 >> 7/  Zosyn 7/7 >> 7/8  HPI/Subjective: Patient awake and alert but does not respond verbally. 2 daughters at bedside  Objective: Filed Vitals:   11/12/15 0100 11/12/15 0200 11/12/15 0300 11/12/15 0400  BP: 82/28 69/36 88/27  106/84  Pulse: 49 48 44 49  Temp: 98.4 F (36.9 C) 98.6 F (37 C) 98.6 F (37 C) 98.6 F (37 C)  TempSrc:      Resp: 16 14 11 16   Height:      Weight:    44.1 kg (97 lb 3.6 oz)  SpO2: 100% 100% 100% 100%    Intake/Output Summary (Last 24 hours) at 11/12/15 0535 Last data filed at 11/12/15 0400  Gross per 24 hour  Intake   4200 ml  Output   1850 ml  Net   2350 ml     Filed Weights   11/11/15 1000 11/12/15 0400  Weight: 40.4 kg (89 lb 1.1 oz) 44.1 kg (97 lb 3.6 oz)    Exam: Constitutional:  . Appears restless, nontoxic, eyes open. Eyes:  Marland Kitchen Appear grossly unremarkable ENMT:  . Limited exam, lips appear unremarkable Respiratory:  . CTA bilaterally, no w/r/r.  . Respiratory effort normal. No retractions or accessory muscle use Cardiovascular:  . RRR, no m/r/g .  1+ bilateral pedal edema. 1+ upper extremity edema. . Telemetry sinus rhythm Abdomen:  . Soft, nontender, difficult to examine secondary to patient movement Skin:  . Grossly unremarkable Psychiatric:  o Advanced dementia at baseline  I have personally reviewed following labs and  imaging studies:  Sodium 150  Potassium 2.7  Creatinine 1.27, improving  Glucose 123  Hgb 9.9, platelets 78  Scheduled Meds: . divalproex  750 mg Oral Daily  . piperacillin-tazobactam (ZOSYN) IVPB  2.25 g Intravenous Q6H  . sodium chloride flush  3 mL Intravenous Q12H  . [START ON 11/13/2015] vancomycin  500 mg Intravenous Q48H   Continuous Infusions: . dextrose 150 mL/hr at 11/12/15 0500    Principal Problem:   Sepsis ([place]) Active Problems:   Hypotension   Hypothermia   Severe dementia   Acute hypernatremia   Acute kidney injury ([place])   Anemia   Thrombocytopenia ([place])   LOS: 1 day   Time spent 35 minutes  By signing my name below, I, Delene Ruffini, attest that this documentation has been prepared under the direction and in the presence of [place]. Sarajane Jews, MD. Electronically Signed: Delene Ruffini, Scribe.  11/12/2015 8:45am  I personally performed the services described in this documentation. All medical record entries made by the scribe were at my direction. I have reviewed the chart and agree that the record reflects my personal performance and is accurate and complete. [name], MD

## 2015-11-12 NOTE — Telephone Encounter (Signed)
Direct call made to [name], one of Ms Moreheads's daughters, making her aware of my support as her mother is currently hospitalized She expressed gratitude

## 2015-11-12 NOTE — Progress Notes (Signed)
Pt resting in bed with eyes closed. IV patent. No signs of any distress.  Daughters visiting at the bedside. Report called to L.Bullins, Therapist, sports. Pt to be transferred via bed to room 304.

## 2015-11-12 NOTE — Progress Notes (Signed)
CRITICAL VALUE ALERT  Critical value received:  K+ 2.7  Date of notification:  11/12/15  Time of notification:  0900  Critical value read back:yes  Nurse who received alert:  E.Jensen Kilburg RN  MD notified (1st page):  Dr. Sarajane Jews  Time of first page:  0930  MD notified (2nd page):  Time of second page:  Responding MD:  Dr. Sarajane Jews  Time MD responded:  [id number]

## 2015-11-13 DIAGNOSIS — R627 Adult failure to thrive: Secondary | ICD-10-CM

## 2015-11-13 LAB — GLUCOSE, CAPILLARY: GLUCOSE-CAPILLARY: 59 mg/dL — AB (ref 65–99)

## 2015-11-13 NOTE — Discharge Summary (Signed)
Physician Discharge Summary  [name] [id number] DOB: 05/05/1934 DOA: 11/11/2015  PCP: [name], MD  Admit date: 11/11/2015 Discharge date: 11/15/2015  Recommendations for Outpatient Follow-up:  1. Home with hospice care  Follow-up Information    Follow up with [name], MD.   Specialty:  Family Medicine   Why:  As needed   Contact information:   [address] [place] [address] [phone]      Discharge Diagnoses:  1. Advanced dementia with failure to thrive, profound dehydration and hypernatremia.   2. Acute kidney injury secondary to failure to thrive and poor oral intake. 3. Demand ischemia secondary to severe dehydration 4. Macrocytic anemia 5. Thrombocytopenia   Discharge Condition: stable Disposition: discharge home with hospice care  Diet recommendation: regular  Filed Weights   11/11/15 1000 11/12/15 0400  Weight: 40.4 kg (89 lb 1.1 oz) 44.1 kg (97 lb 3.6 oz)    History of present illness:  82 yof presented to the ED with complaints of altered mental status. While in the ED, she was noted to have altered mentation with hypothermia and hypotension. Sepsis protocol was initiated and the patient was started on fluid rescusitation and empiric antibiotics, and admitted to SDU for further management.  Hospital Course:  Patient was treated with broad-spectrum antibiotics and aggressive volume resuscitation. Mental status improved somewhat but she developed recurrent hypothermia and had refractory hypotension. In discussion with the daughters, patient had advanced dementia and very poor oral intake at home and the decision was made to proceed comfort care. She subsequently stabilized although prognosis is a matter of weeks at most given poor oral intake. Daughter's wish to take the patient home with hospice. Individual issues as below.  1. Advanced dementia with failure to thrive, profound dehydration and hypernatremia.  Transitioned to comfort care 7/8.  2. Possible sepsis on admission with hypotension, hypothermia and acute encephalopathy. Urinalysis negative, chest x-ray clear. Blood cultures and urine culture no growth. Antibiotics stopped and hemodynamics stable. Sepsis ruled out. 3. Acute kidney injury secondary to failure to thrive and poor oral intake. 4. Demand ischemia secondary to severe dehydration 5. Macrocytic anemia, B12 elevated 6. Thrombocytopenia  7. Advanced dementia with failure to thrive.  Consultants: 6. none  Procedures:  none  Antimicrobials:  Vancomycin 7/7 >> 7/8  Zosyn 7/7 >> 7/8  Discharge Instructions  Discharge Instructions    Bed rest    Complete by:  As directed      Discharge instructions    Complete by:  As directed   Diet as desired.          Discharge Medication List as of 11/15/2015 11:40 AM    STOP taking these medications     amLODipine-benazepril (LOTREL) 5-40 MG capsule      cloNIDine (CATAPRES) 0.1 MG tablet      divalproex (DEPAKOTE ER) 250 MG 24 hr tablet      EXELON 13.3 MG/24HR PT24      megestrol (MEGACE) 40 MG tablet      Menthol-Zinc Oxide (CHAMOSYN) 0.45-20 % OINT      pantoprazole (PROTONIX) 40 MG tablet      pravastatin (PRAVACHOL) 40 MG tablet      temazepam (RESTORIL) 30 MG capsule        Allergies  Allergen Reactions  . Codeine Other (See Comments)    UNKNOWN  . Sulfonamide Derivatives Other (See Comments)    UNKNOWN    The results of significant diagnostics from this hospitalization (  including imaging, microbiology, ancillary and laboratory) are listed below for reference.    Significant Diagnostic Studies: Dg Chest Port 1 View  11/11/2015  CLINICAL DATA:  Altered mental status. EXAM: PORTABLE CHEST 1 VIEW COMPARISON:  07/30/2013 FINDINGS: Presumed skin fold projects over the lateral right hemithorax. Lung markings extend beyond this lucency, no evidence for pneumothorax. Lung volumes are low. Small linear  opacity at the right lung base likely atelectasis Heart size and mediastinal contours are unchanged. No pleural effusion or pulmonary edema. No acute osseous abnormality. IMPRESSION: Low lung volumes.  Linear right basilar atelectasis. Skin fold projects over the right hemithorax. Electronically Signed   By: Jeb Levering M.D.   On: 11/11/2015 03:41    Microbiology: Recent Results (from the past 240 hour(s))  Urine culture     Status: None   Collection Time: 11/11/15  3:15 AM  Result Value Ref Range Status   Specimen Description URINE, CATHETERIZED  Final   Special Requests NONE  Final   Culture NO GROWTH Performed at [facility]   Final   Report Status 11/12/2015 FINAL  Final  Blood Culture (routine x 2)     Status: None (Preliminary result)   Collection Time: 11/11/15  3:20 AM  Result Value Ref Range Status   Specimen Description BLOOD RIGHT ANTECUBITAL  Final   Special Requests   Final    BOTTLES DRAWN AEROBIC AND ANAEROBIC AEB=4CC ANA=2CC   Culture NO GROWTH 4 DAYS  Final   Report Status PENDING  Incomplete  Blood Culture (routine x 2)     Status: None (Preliminary result)   Collection Time: 11/11/15  3:26 AM  Result Value Ref Range Status   Specimen Description BLOOD LEFT WRIST  Final   Special Requests BOTTLES DRAWN AEROBIC ONLY 2CC  Final   Culture NO GROWTH 4 DAYS  Final   Report Status PENDING  Incomplete  MRSA PCR Screening     Status: None   Collection Time: 11/11/15 11:00 AM  Result Value Ref Range Status   MRSA by PCR NEGATIVE NEGATIVE Final    Comment:        The GeneXpert MRSA Assay (FDA approved for NASAL specimens only), is one component of a comprehensive MRSA colonization surveillance program. It is not intended to diagnose MRSA infection nor to guide or monitor treatment for MRSA infections.      Labs: Basic Metabolic Panel:  Recent Labs Lab 11/11/15 0504 11/11/15 0741 11/11/15 1745 11/12/15 0808  NA 167* 166* 160* 150*  K 3.5 3.5  3.0* 2.7*  CL >130* >130* >130* 124*  CO2 30 28 23 23   GLUCOSE 166* 72 67 123*  BUN 85* 72* 53* 31*  CREATININE 2.77* 2.39* 1.69* 1.27*  CALCIUM 7.4* 7.0* 6.7* 6.8*   Liver Function Tests:  Recent Labs Lab 11/11/15 0504  AST 37  ALT 27  ALKPHOS 48  BILITOT 0.9  PROT 3.9*  ALBUMIN 1.9*   No results for input(s): LIPASE, AMYLASE in the last 168 hours. No results for input(s): AMMONIA in the last 168 hours. CBC:  Recent Labs Lab 11/11/15 0320 11/11/15 0741 11/11/15 1745 11/12/15 0808  WBC 7.8 6.2 5.7 7.1  NEUTROABS 5.1  --   --   --   HGB 9.8* 8.7* 8.1* 9.9*  HCT 32.6* 28.4* 27.1* 30.9*  MCV 108.7* 107.6* 106.7* 103.0*  PLT 78* 68* 68* 78*   Cardiac Enzymes:  Recent Labs Lab 11/11/15 0504 11/11/15 0945  TROPONINI 0.04* 0.04*  Principal Problem:   Sepsis ([place]) Active Problems:   Hypotension   Hypothermia   Severe dementia   Acute hypernatremia   Acute kidney injury ([place])   Anemia   Thrombocytopenia (HCC)   Pressure ulcer   Time coordinating discharge: 35 minutes  Signed:  [name], MD [facility] 11/15/2015, 5:01 PM  By signing my name below, I, Delene Ruffini, attest that this documentation has been prepared under the direction and in the presence of Moet Mikulski P. Sarajane Jews, MD. Electronically Signed: Delene Ruffini, Scribe.  11/15/2015 11:22am

## 2015-11-13 NOTE — Progress Notes (Signed)
PROGRESS NOTE  [name] [id number] DOB: 1933/06/05 DOA: 11/11/2015 PCP: [name], MD  Brief Narrative: 80 yof presented to the ED with complaints of altered mental status. While in the ED, she was noted to have altered mentation with hypothermia and hypotension. Sepsis protocol was initiated and the patient was started on fluid rescusitation and empiric antibiotics, and admitted to SDU for further management. Due to patient's refractory hypotension and recurrent hypothermia, overall poor oral intake, and advanced dementia, the decision was made to transition the patient to comfort care and transfer to medical bed.  Assessment/Plan: 1. Advanced dementia with failure to thrive, profound dehydration and hypernatremia. Transition to comfort care 7/8. Diet as tolerated. Left expectancy less than 3 weeks. 2. Possible sepsis on admission with hypotension, hypothermia and acute encephalopathy. Urinalysis negative, chest x-ray was clear. Blood cultures and urine culture no growth. Antibiotics stopped and hemodynamics stable. Sepsis diet. Hypotension resolved. 3. Acute kidney injury secondary to failure to thrive and poor oral intake. 4. Demand ischemia 5. Macrocytic anemia, B12 elevated 6. Thrombocytopenia 7. Advanced dementia with failure to thrive.    Appears stable. Continue full comfort measures. If remains stable, likely transition to home with hospice in the next 48 hours.  Discussed in detail again today with daughters at bedside.  Code Status: DNR/DNI Family Communication:  Disposition Plan:   [name], MD  [facility] Direct contact: [phone] --Via amion app OR  --[email or web address]; password [email or web address]  7PM-7AM contact night coverage as above 11/13/2015, 7:50 AM  LOS: 2 days   Consultants:  none  Procedures:  none  Antimicrobials:  Vancomycin 7/7 >> 7/  Zosyn 7/7 >> 7/8  HPI/Subjective: Appears to be resting peacefully.  Objective: Filed  Vitals:   11/12/15 1208 11/12/15 1300 11/12/15 2118 11/13/15 0625  BP:  109/52 128/37 134/41  Pulse:  54 55 79  Temp: 98 F (36.7 C) 98.4 F (36.9 C) 98 F (36.7 C) 98.9 F (37.2 C)  TempSrc: Axillary  Oral Oral  Resp:  18 18 18   Height:      Weight:      SpO2:  98% 98% 100%    Intake/Output Summary (Last 24 hours) at 11/13/15 0750 Last data filed at 11/13/15 0626  Gross per 24 hour  Intake    240 ml  Output   1100 ml  Net   -860 ml     Filed Weights   11/11/15 1000 11/12/15 0400  Weight: 40.4 kg (89 lb 1.1 oz) 44.1 kg (97 lb 3.6 oz)    Exam: Constitutional:  . Appears calm, comfortable, alert Respiratory:  . CTA bilaterally, no w/r/r.  . Respiratory effort normal. No retractions or accessory muscle use Cardiovascular:  . RRR, no m/r/g Psychiatric:  o Advanced dementia at baseline  I have personally reviewed following labs and imaging studies:  No new data  Scheduled Meds: . divalproex  750 mg Oral Daily  . sodium chloride flush  3 mL Intravenous Q12H   Continuous Infusions:    Principal Problem:   Sepsis ([place]) Active Problems:   Hypotension   Hypothermia   Severe dementia   Acute hypernatremia   Acute kidney injury (HCC)   Anemia   Thrombocytopenia (HCC)   Pressure ulcer   LOS: 2 days   Time spent 15 minutes  By signing my name below, I, Delene Ruffini, attest that this documentation has been prepared under the direction and in the presence of Thessaly Mccullers P. Sarajane Jews, MD. Electronically Signed: Delene Ruffini, Scribe.  11/13/2015 12:35pm  I personally performed the services described in this documentation. All medical record entries made by the scribe were at my direction. I have reviewed the chart and agree that the record reflects my personal performance and is accurate and complete. [name], MD

## 2015-11-14 NOTE — Progress Notes (Signed)
PROGRESS NOTE  [name] [id number] DOB: Dec 06, 1933 DOA: 11/11/2015 PCP: [name], MD  Brief Narrative: 20 yof presented to the ED with complaints of altered mental status. While in the ED, she was noted to have altered mentation with hypothermia and hypotension. Sepsis protocol was initiated and the patient was started on fluid rescusitation and empiric antibiotics, and admitted to SDU for further management. Due to patient's refractory hypotension and recurrent hypothermia, overall poor oral intake, and advanced dementia, the decision was made to transition the patient to comfort care and transfer to medical bed. She stabilized and will be discharge home with hospice.  Assessment/Plan: 1. Advanced dementia with failure to thrive, profound dehydration and hypernatremia. Transitioned to comfort care 7/8. Diet as tolerated.  2. Possible sepsis on admission with hypotension, hypothermia and acute encephalopathy. Urinalysis negative, chest x-ray clear. Blood cultures and urine culture no growth. Antibiotics stopped and hemodynamics stable. Sepsis ruled out. 3. Acute kidney injury secondary to failure to thrive and poor oral intake. 4. Demand ischemia secondary to severe dehydration 5. Macrocytic anemia, B12 elevated 6. Thrombocytopenia 7. Advanced dementia with failure to thrive.   Patient has stabilized with improvement in blood pressure but oral intake remains very poor, recurrent dehydration, hypernatremia with progression to advanced kidney failure and death expected given her advanced dementia and poor oral intake at baseline.  Discussed in detail to daughters present at bedside, plan discharge home with hospice 7/11.  Code Status: DNR/DNI Family Communication: daughters present at bedside Disposition Plan: discharge home with hospice.  [name], MD  [facility] Direct contact: [phone] --Via amion app OR  --[email or web address]; password [email or web address]  7PM-7AM  contact night coverage as above 11/14/2015, 7:51 AM  LOS: 3 days   Consultants:  none  Procedures:  none  Antimicrobials:  Vancomycin 7/7 >> 7/8  Zosyn 7/7 >> 7/8  HPI/Subjective: Alert and responsive today. Daughter reports she has had some fluid intake.   Objective: Filed Vitals:   11/13/15 0625 11/13/15 1400 11/13/15 2146 11/14/15 0609  BP: 134/41 115/46 99/46 114/46  Pulse: 79 55 64 59  Temp: 98.9 F (37.2 C) 98.8 F (37.1 C) 98.7 F (37.1 C) 98.9 F (37.2 C)  TempSrc: Oral Oral Oral Oral  Resp: 18 18 18 18   Height:      Weight:      SpO2: 100% 100% 100% 100%    Intake/Output Summary (Last 24 hours) at 11/14/15 0751 Last data filed at 11/14/15 [id number]  Gross per 24 hour  Intake      0 ml  Output    450 ml  Net   -450 ml     Filed Weights   11/11/15 1000 11/12/15 0400  Weight: 40.4 kg (89 lb 1.1 oz) 44.1 kg (97 lb 3.6 oz)    Exam:  Constitutional:  . Appears calm and comfortable Respiratory:  . CTA bilaterally, no w/r/r.  . Respiratory effort normal. No retractions or accessory muscle use Cardiovascular:  . RRR, no m/r/g . 1+ bilateral ankle edema   Psychiatric:  o Advanced dementia at baseline  I have personally reviewed following labs and imaging studies:  No new data  Scheduled Meds: . divalproex  750 mg Oral Daily  . sodium chloride flush  3 mL Intravenous Q12H   Continuous Infusions:    Principal Problem:   Sepsis ([place]) Active Problems:   Hypotension   Hypothermia   Severe dementia   Acute hypernatremia   Acute kidney injury (HCC)   Anemia  Thrombocytopenia ([place])   Pressure ulcer   LOS: 3 days   Time spent 15 minutes  By signing my name below, I, Delene Ruffini, attest that this documentation has been prepared under the direction and in the presence of [place]. Tamara Jews, MD. Electronically Signed: Delene Ruffini, Scribe.  11/14/2015 11:21am  I personally performed the services described in this documentation.  All medical record entries made by the scribe were at my direction. I have reviewed the chart and agree that the record reflects my personal performance and is accurate and complete. [name], MD

## 2015-11-14 NOTE — Care Management Note (Signed)
Case Management Note  Patient Details  Name: [name] MRN: [id number] Date of Birth: July 15, 1933  Subjective/Objective:                  Pt is from home, lives with her daughter's who provide 24/7 care. Pt has been recommended for Hospice, daughters are agreeable and wish to take their mother home with Hospice support. Pt's daughter have chosen [facility]. Referral faxed and call made to verify receipt of info. Hospice rep will make visit to hospital and meet with family. Per family, no DME needed at this time.   Action/Plan: Pt plans to return home with Hopsice services tomorrow.   Expected Discharge Date:      11/15/2015            Expected Discharge Plan:  Home w Hospice Care  In-House Referral:  NA  Discharge planning Services  CM Consult  Post Acute Care Choice:  Hospice Choice offered to:  Adult Children  DME Arranged:    DME Agency:     HH Arranged:  RN [place] Agency:  [facility]  Status of Service:  In process, will continue to follow  If discussed at Long Length of Stay Meetings, dates discussed:    Additional Comments:  Sherald Barge, RN 11/14/2015, 12:40 PM

## 2015-11-14 NOTE — Care Management Important Message (Signed)
Important Message  Patient Details  Name: [name] MRN: [id number] Date of Birth: 1933/05/17   Medicare Important Message Given:  Yes    Sherald Barge, RN 11/14/2015, 12:24 PM

## 2015-11-14 NOTE — Care Management (Signed)
Patient Information    Patient Name Sex DOB   [name], [name] ([id number]) Female 1933-07-27     Room Bed   A304 A304-01    Patient Demographics    Address Phone E-mail Address   [place] [address] [phone] (Home) [phone] (Mobile) [email or web address]    Patient Ethnicity & Race    Ethnic Group Patient Race   Not Hispanic or Latino Black or African American    Emergency Contact(s)    Name Relation Home Work [place] Daughter [phone]  [phone]   [place], Durnin [phone]  [phone]    Documents on File      Status Date Received Description   Documents for the Patient   EMR Medication Summary Not Received     EMR Immunization Summary Not Received     EMR Problem Summary Not Received     EMR Patient Summary Not Received     [place] Received 11/27/10    [place] E-Signature HIPAA Notice of Privacy Received 10/11/10    [place] E-Signature HIPAA Notice of Privacy Spanish Not Received     Driver's License Not Received     Advance Directives/Living Will/HCPOA/POA Not Received     Driver's License Not Received     [place] Received 10/27/12    Historic Radiology Documentation Not Received     Insurance Card Received 10/05/10    Historic Radiology Documentation Not Received     Historic Radiology Documentation Not Received     Insurance Card Not Received     AMB Correspondence Not Received  Office note 05/12 Lyman Speller    Financial Application Not Received     Insurance Card Received 11/21/10    Insurance Card Not Received     AMB Correspondence Not Received  letter 07/12 Rdsv [employer]   Insurance Card Received 04/17/11 RCGD - OLD   Insurance Card Received 01/03/11    Insurance Card Received 01/03/11    Insurance Card Received 01/03/11    Insurance Card Not Received     HIM ROI  Authorization Not Received  RCGD -- copy to PCP   Insurance Card Received 07/16/11 RCGD - OLD   HIM ROI Authorization Not Received  RCGD--07/16/11 note to PCP   Insurance Card Received 08/13/11    Insurance Card Not Received     HIM ROI Authorization Not Received  [employer] GBA Probe Audit Review   HIM ROI Authorization Not Received  RCGD--09/18/11 note to PCP   HIM ROI Authorization Not Received  RCGD--10/15/11 note to PCP   HIM ROI Authorization Not Received  RCGD--10/19/11 EGD op note & pathology result letter to PCP   AMB Correspondence Not Received  MMSE [employer] MD,M   Insurance Card Received 06/18/12    Insurance Card Received 06/18/12    Insurance Card Not Received     Insurance Card Received 07/14/12 RCGD - OLD    HIPAA NOTICE OF PRIVACY - Scanned Not Received     Insurance Card Received 01/22/13    Insurance Card Not Received     AMB Correspondence Not Received  11/14 Rx [facility]   AMB Provider Completed Forms Not Received  11/14 LMN LAYNES   AMB Correspondence  06/24/13 02/15 Referral [place], K   AMB Correspondence  08/02/13 03/15 neuro [employer] Card Received 08/27/13 rpc   Insurance Card Received 08/27/13  Advanced Beneficiary Notice (ABN) Not Received     Insurance Card Received 10/16/13 Hum[employer]/DJ/GNA   AMB Correspondence  10/16/13 [place] COGNITIVE ASSESSMENT (MOCA)   AMB Correspondence  03/22/14 OFFICE NOTE [facility]   Insurance Card Received 05/19/14    Other Photo ID Not Received     AMB Correspondence  10/12/14 OFFICE NOTE [facility]   AMB Correspondence  12/01/14 LETTER [facility]   AMB Provider Completed Forms  [id number] [facility]   AMB Correspondence  03/21/15 ORDER [facility] [facility]   AMB Provider Completed Forms  06/16/15 ORDER [facility]   AMB Correspondence  06/14/15 ORDER  [place]   AMB HH/NH/Hospice  06/22/15 PROFESSIONAL COMMUNICATION [facility]   AMB HH/NH/Hospice  [id number] HOME HEALTH CERTIFICATION & POC [facility]   AMB Correspondence  10/04/15 RX [facility]   [place] E-Signature HIPAA Notice of Privacy Received 11/11/15    Documents for the Encounter   AOB (Assignment of Insurance Benefits) Not Received     E-signature AOB Received 11/11/15    MEDICARE RIGHTS Not Received     E-signature Medicare Rights Received 11/11/15    Cardiac Monitoring Strip  11/11/15     Admission Information    Attending Provider Admitting Provider Admission Type Admission Date/Time   Samuella Cota, MD Samuella Cota, MD Emergency 11/11/15 [phone]   Discharge Date Hospital Service Auth/Cert Status Service Area    Internal Medicine Incomplete [place]   Unit Room/Bed Admission Status   AP-DEPT 300 A304/A304-01 Admission (Confirmed)         Admission    Complaint   [facility] Account    Name Acct ID Class Status Primary Coverage   Alexei, Hetzel [id number] Arthur        Guarantor Account (for Hospital Account [id number])    Name Relation to Pt Service Area Active? Acct Type   [place] Self CHSA Yes Personal/Family   Address Phone       [place], [place] [address] [phone])          Coverage Information (for Hospital Account [id number])    [place] MEDICARE    F/O Payor/Plan Precert #   [facility] [place] #   [place], [id number]   [place] [address] [phone]       2. TRICARE/TRICARE FOR LIFE    F/O Payor/Plan Precert #   TRICARE/TRICARE FOR LIFE    Subscriber Subscriber #   Nory, Nygaard [id number]   Address Phone   [address]  [place] [address] [phone]

## 2015-11-15 NOTE — Progress Notes (Addendum)
Discharged PT per MD order and protocol. Discharge handouts reviewed/explained. Education completed.  Pt verbalized understanding and left with all belongings. VSS. IV catheter D/C.  Patient taken out by EMS with hopsice care at home. Foley left in per family request. Verbal order from  MD to leave foley in.    Oswald Hillock, RN

## 2015-11-15 NOTE — Progress Notes (Signed)
PROGRESS NOTE  [name] [id number] DOB: January 28, 1934 DOA: 11/11/2015 PCP: [name], MD  Brief Narrative: 17 yof presented to the ED with complaints of altered mental status. While in the ED, she was noted to have altered mentation with hypothermia and hypotension. Sepsis protocol was initiated and the patient was started on fluid rescusitation and empiric antibiotics, and admitted to SDU for further management. Due to patient's refractory hypotension and recurrent hypothermia, overall poor oral intake, and advanced dementia, the decision was made to transition the patient to comfort care and transfer to medical bed. She stabilized and will be discharge home with hospice.  Assessment/Plan: 1. Advanced dementia with failure to thrive, profound dehydration and hypernatremia. Transitioned to comfort care 7/8.  2. Possible sepsis on admission with hypotension, hypothermia and acute encephalopathy.  Urinalysis negative, chest x-ray clear. Blood cultures and urine culture no growth. Antibiotics stopped and hemodynamics stable. Sepsis ruled out. 3. Acute kidney injury secondary to failure to thrive and poor oral intake. 4. Demand ischemia secondary to severe dehydration 5. Macrocytic anemia, B12 elevated 6. Thrombocytopenia  7. Advanced dementia with failure to thrive.   Discharge home today with hospice  Patient has stabilized with improvement in blood pressure but oral intake remains very poor, recurrent dehydration, hypernatremia with progression to advanced kidney failure and death expected given her advanced dementia and poor oral intake at baseline.  Code Status: DNR/DNI Family Communication: daughters present at bedside  Disposition Plan: discharge home with hospice.  [name], MD  [facility] Direct contact: [phone] --Via amion app OR  --[email or web address]; password [email or web address]  7PM-7AM contact night coverage as above 11/15/2015, 8:34 AM  LOS: 4 days    Consultants:  none  Procedures:  none  Antimicrobials:  Vancomycin 7/7 >> 7/8  Zosyn 7/7 >> 7/8  HPI/Subjective: Daughters at bedside. No reliable history from patient.  Objective: Filed Vitals:   11/14/15 0609 11/14/15 1654 11/14/15 2124 11/15/15 0506  BP: 114/46 112/44 127/50 115/49  Pulse: 59 74 67 65  Temp: 98.9 F (37.2 C) 98.7 F (37.1 C) 98 F (36.7 C) 97.8 F (36.6 C)  TempSrc: Oral Oral Axillary Axillary  Resp: 18 20 17 18   Height:      Weight:      SpO2: 100% 100% 100% 100%    Intake/Output Summary (Last 24 hours) at 11/15/15 0834 Last data filed at 11/15/15 0507  Gross per 24 hour  Intake      0 ml  Output    500 ml  Net   -500 ml     Filed Weights   11/11/15 1000 11/12/15 0400  Weight: 40.4 kg (89 lb 1.1 oz) 44.1 kg (97 lb 3.6 oz)    Exam: Constitutional:  . Appears calm and comfortable. Very awake and alert. Respiratory:  . CTA bilaterally, no w/r/r.  . Respiratory effort normal. No retractions or accessory muscle use Cardiovascular:  . RRR, no m/r/g . Trace ankle edema    I have personally reviewed following labs and imaging studies:  No new data  Scheduled Meds: . divalproex  750 mg Oral Daily  . sodium chloride flush  3 mL Intravenous Q12H   Continuous Infusions:    Principal Problem:   Sepsis ([place]) Active Problems:   Hypotension   Hypothermia   Severe dementia   Acute hypernatremia   Acute kidney injury (HCC)   Anemia   Thrombocytopenia (H[facility])   Pressure ulcer   LOS: 4 days   Time spent 15 minutes  By  signing my name below, I, Delene Ruffini, attest that this documentation has been prepared under the direction and in the presence of Daphene Chisholm P. Sarajane Jews, MD. Electronically Signed: Delene Ruffini, Scribe.  11/15/2015 11:21am  I personally performed the services described in this documentation. All medical record entries made by the scribe were at my direction. I have reviewed the chart and agree that the  record reflects my personal performance and is accurate and complete. [name], MD

## 2015-11-15 NOTE — Care Management Note (Signed)
Case Management Note  Patient Details  Name: [name] MRN: [id number] Date of Birth: 1933-06-12   Expected Discharge Date:    11/15/2015              Expected Discharge Plan:  Home w Hospice Care  In-House Referral:  NA  Discharge planning Services  CM Consult  Post Acute Care Choice:  Hospice Choice offered to:  Adult Children  DME Arranged:    DME Agency:     HH Arranged:  RN [place] Agency:  [facility]  Status of Service:  Completed, signed off  If discussed at [employer] of Stay Meetings, dates discussed:    Additional Comments: Pt discharging home today with Hospice services through [facility]. Daughters at bedside. Hospital bed has been delivered from [facility]. EMS transport arranged for transport home.   Sherald Barge, RN 11/15/2015, 1:17 PM

## 2015-11-16 LAB — CULTURE, BLOOD (ROUTINE X 2)
CULTURE: NO GROWTH
Culture: NO GROWTH

## 2015-11-25 ENCOUNTER — Ambulatory Visit (INDEPENDENT_AMBULATORY_CARE_PROVIDER_SITE_OTHER): Payer: Medicare Other | Admitting: Internal Medicine

## 2016-01-06 DEATH — deceased

## 2016-09-09 IMAGING — CT CT HEAD W/O CM
1 series · 16 of 30 positions shown, 20 images · non-contrast
Comparison: 09/30/2013 and prior exams.

CLINICAL DATA: 82-year-old female with altered mental status and
possible seizure.

EXAM:
CT HEAD WITHOUT CONTRAST
TECHNIQUE: Contiguous axial images were obtained from the base of the skull
through the vertex without intravenous contrast.

[Series 2: headtrauma 4.8 h37s · axial · 0.43mm/px · z∈[+39,+191]mm · 16 of 36 slices shown, 20 images]
[im 2/36  brain]
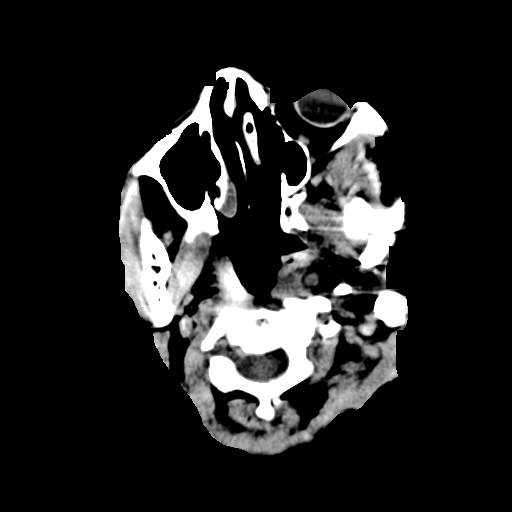
[im 2/36  bone]
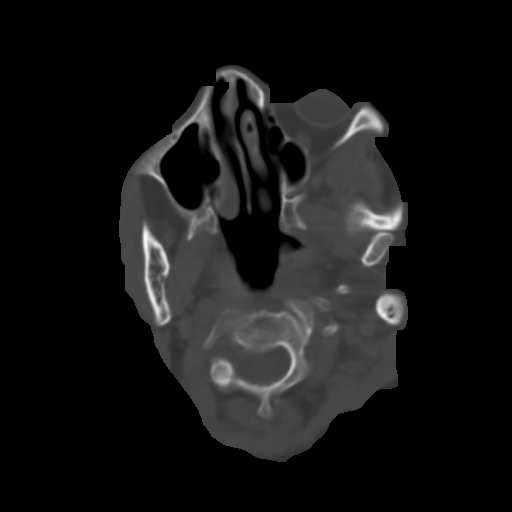
[im 4/36  brain]
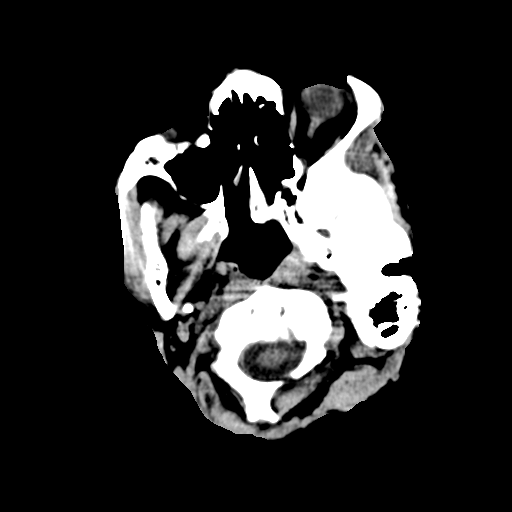
[im 7/36  brain]
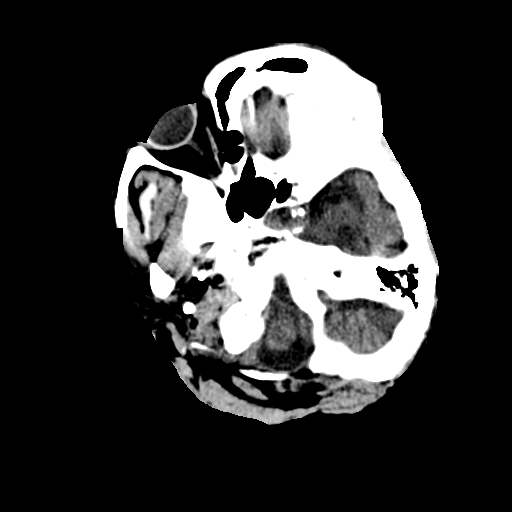
[im 9/36  brain]
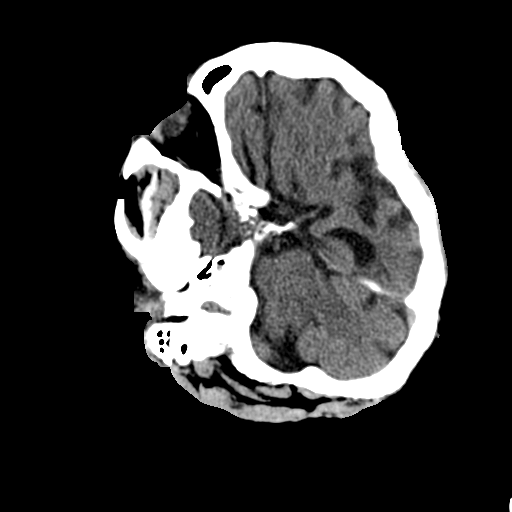
[im 10/36  brain]
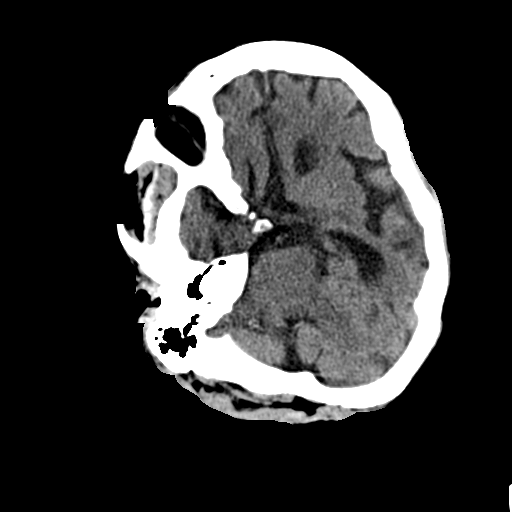
[im 10/36  bone]
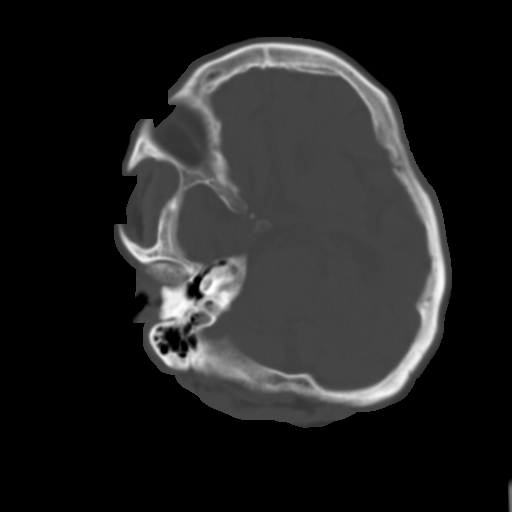
[im 13/36  brain]
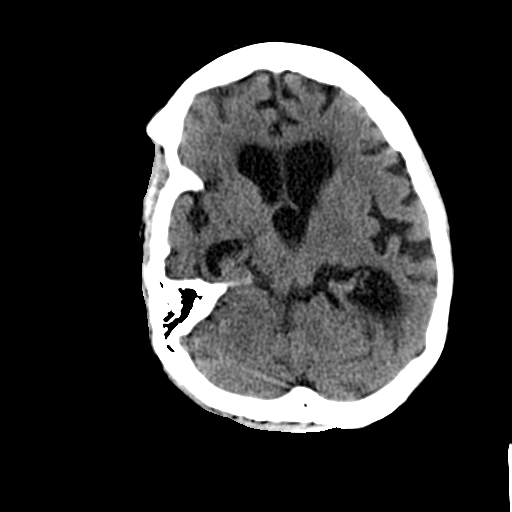
[im 15/36  brain]
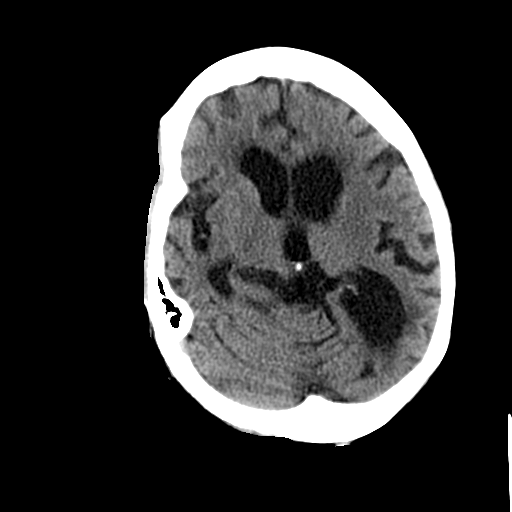
[im 17/36  brain]
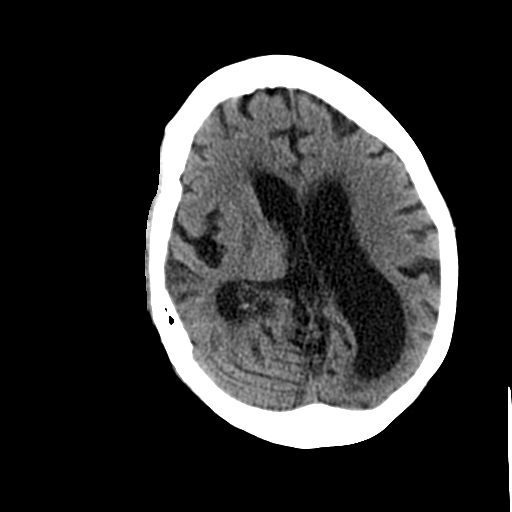
[im 19/36  brain]
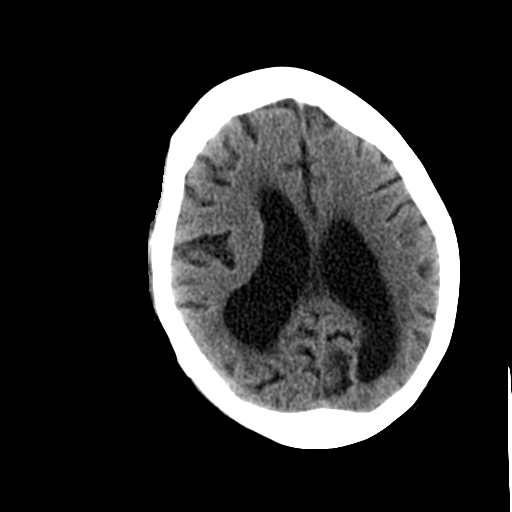
[im 19/36  bone]
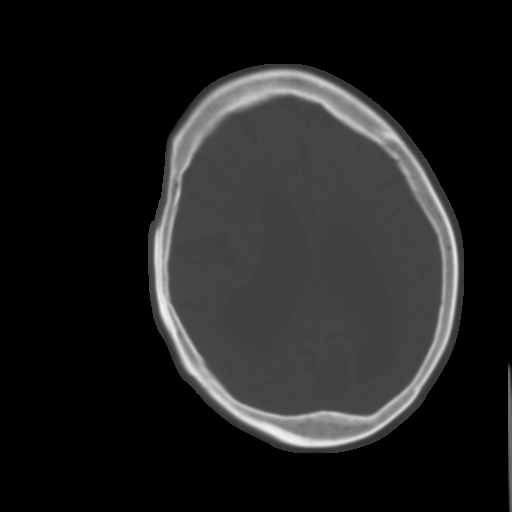
[im 21/36  brain]
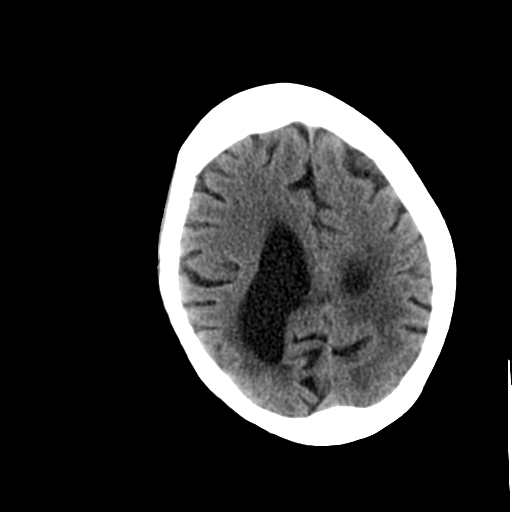
[im 23/36  brain]
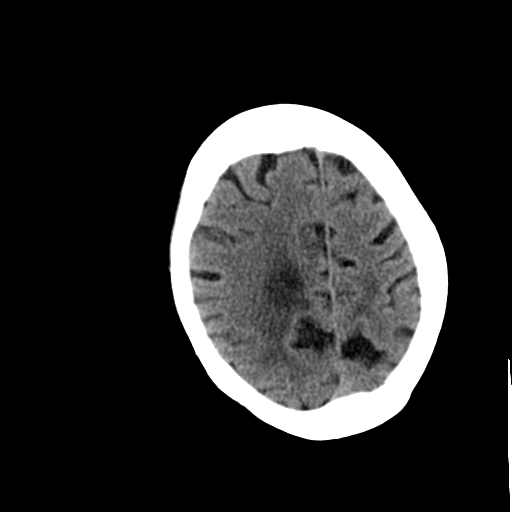
[im 26/36  brain]
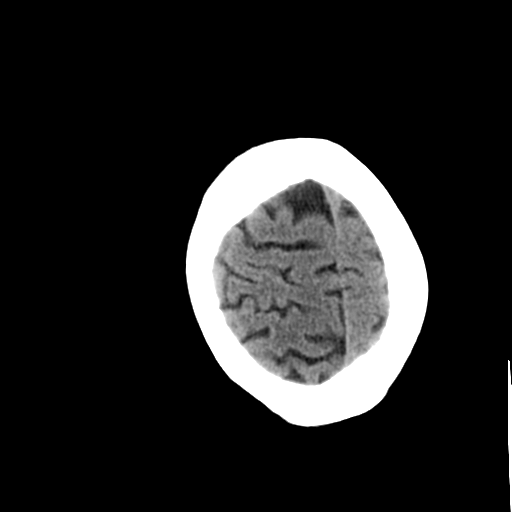
[im 27/36  brain]
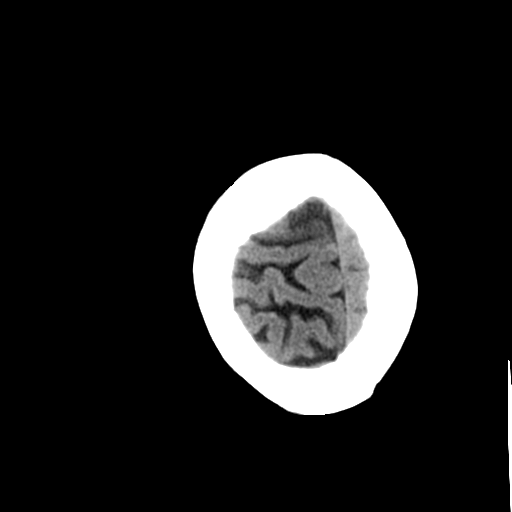
[im 27/36  bone]
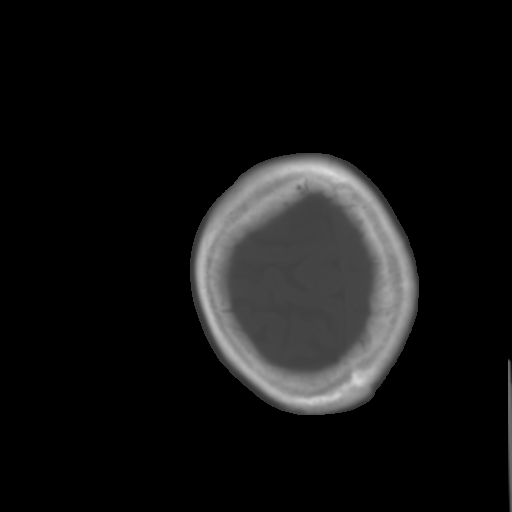
[im 29/36  brain]
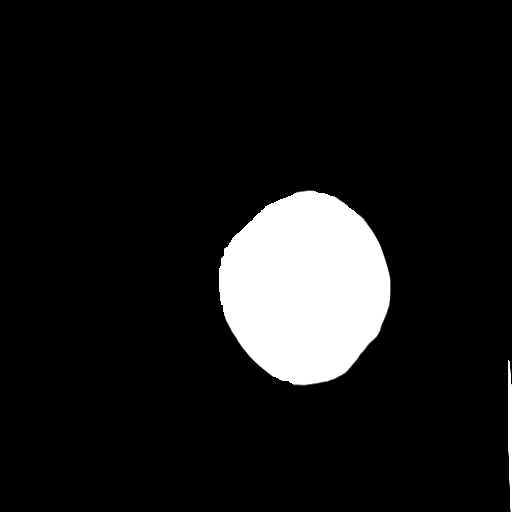
[im 32/36  brain]
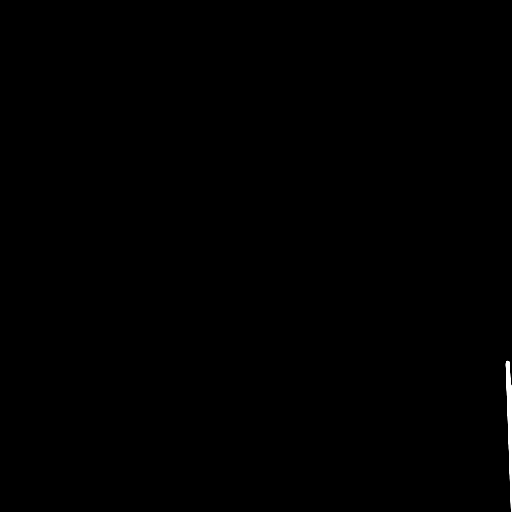
[im 34/36  brain]
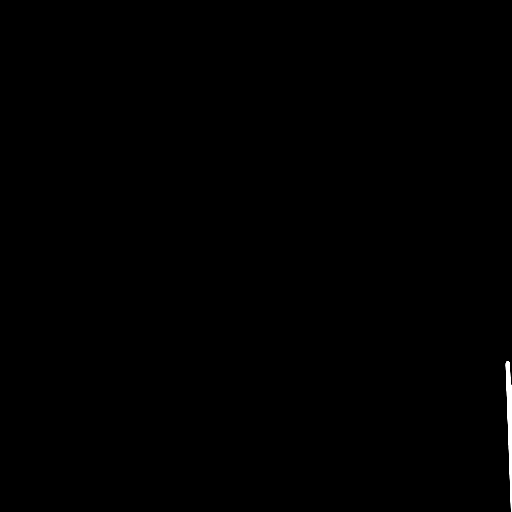

[16 of 30 positions shown; findings below may reference images not displayed]

FINDINGS: Atrophy, remote left occipital infarct and chronic small-vessel
white matter ischemic changes again noted.

No acute intracranial abnormalities are identified, including mass
lesion or mass effect, hydrocephalus, extra-axial fluid collection,
midline shift, hemorrhage, or acute infarction.

The visualized bony calvarium is unremarkable.
IMPRESSION: No evidence of acute intracranial abnormality.

Atrophy, remote left occipital infarct and chronic small-vessel
white matter ischemic changes.
